# Patient Record
Sex: Female | Born: 1945 | Race: White | Hispanic: No | Marital: Married | Smoking: Former smoker
Health system: Southern US, Community
[De-identification: ages and names within clinical notes are randomized; demographics above are authoritative.]

## PROBLEM LIST (undated history)

## (undated) DIAGNOSIS — Z923 Personal history of irradiation: Secondary | ICD-10-CM

## (undated) DIAGNOSIS — M810 Age-related osteoporosis without current pathological fracture: Secondary | ICD-10-CM

## (undated) DIAGNOSIS — D219 Benign neoplasm of connective and other soft tissue, unspecified: Secondary | ICD-10-CM

## (undated) DIAGNOSIS — C50911 Malignant neoplasm of unspecified site of right female breast: Secondary | ICD-10-CM

## (undated) DIAGNOSIS — C4490 Unspecified malignant neoplasm of skin, unspecified: Secondary | ICD-10-CM

## (undated) HISTORY — PX: BREAST EXCISIONAL BIOPSY: SUR124

## (undated) HISTORY — PX: TONSILLECTOMY: SUR1361

## (undated) HISTORY — PX: COLONOSCOPY: SHX174

## (undated) HISTORY — PX: APPENDECTOMY: SHX54

## (undated) HISTORY — DX: Malignant neoplasm of unspecified site of right female breast: C50.911

## (undated) HISTORY — DX: Age-related osteoporosis without current pathological fracture: M81.0

## (undated) HISTORY — DX: Benign neoplasm of connective and other soft tissue, unspecified: D21.9

## (undated) HISTORY — DX: Unspecified malignant neoplasm of skin, unspecified: C44.90

---

## 1974-04-23 HISTORY — PX: TUBAL LIGATION: SHX77

## 1998-06-22 ENCOUNTER — Other Ambulatory Visit: Admission: RE | Admit: 1998-06-22 | Discharge: 1998-06-22 | Payer: Self-pay | Admitting: Gynecology

## 1999-08-29 ENCOUNTER — Other Ambulatory Visit: Admission: RE | Admit: 1999-08-29 | Discharge: 1999-08-29 | Payer: Self-pay | Admitting: Gynecology

## 2000-08-28 ENCOUNTER — Other Ambulatory Visit: Admission: RE | Admit: 2000-08-28 | Discharge: 2000-08-28 | Payer: Self-pay | Admitting: Obstetrics and Gynecology

## 2000-09-12 ENCOUNTER — Ambulatory Visit (HOSPITAL_COMMUNITY): Admission: RE | Admit: 2000-09-12 | Discharge: 2000-09-12 | Payer: Self-pay | Admitting: Gynecology

## 2000-09-12 ENCOUNTER — Encounter: Payer: Self-pay | Admitting: Obstetrics and Gynecology

## 2000-10-07 ENCOUNTER — Ambulatory Visit (HOSPITAL_COMMUNITY): Admission: RE | Admit: 2000-10-07 | Discharge: 2000-10-07 | Payer: Self-pay | Admitting: Internal Medicine

## 2001-08-13 ENCOUNTER — Ambulatory Visit (HOSPITAL_BASED_OUTPATIENT_CLINIC_OR_DEPARTMENT_OTHER): Admission: RE | Admit: 2001-08-13 | Discharge: 2001-08-13 | Payer: Self-pay | Admitting: Plastic Surgery

## 2001-08-13 ENCOUNTER — Encounter (INDEPENDENT_AMBULATORY_CARE_PROVIDER_SITE_OTHER): Payer: Self-pay | Admitting: Specialist

## 2001-08-29 ENCOUNTER — Other Ambulatory Visit: Admission: RE | Admit: 2001-08-29 | Discharge: 2001-08-29 | Payer: Self-pay | Admitting: Obstetrics and Gynecology

## 2001-09-02 ENCOUNTER — Encounter (INDEPENDENT_AMBULATORY_CARE_PROVIDER_SITE_OTHER): Payer: Self-pay | Admitting: Specialist

## 2001-09-02 ENCOUNTER — Ambulatory Visit (HOSPITAL_BASED_OUTPATIENT_CLINIC_OR_DEPARTMENT_OTHER): Admission: RE | Admit: 2001-09-02 | Discharge: 2001-09-02 | Payer: Self-pay | Admitting: Plastic Surgery

## 2001-09-16 ENCOUNTER — Encounter: Payer: Self-pay | Admitting: Obstetrics and Gynecology

## 2001-09-16 ENCOUNTER — Ambulatory Visit (HOSPITAL_COMMUNITY): Admission: RE | Admit: 2001-09-16 | Discharge: 2001-09-16 | Payer: Self-pay | Admitting: Obstetrics and Gynecology

## 2002-09-08 ENCOUNTER — Other Ambulatory Visit: Admission: RE | Admit: 2002-09-08 | Discharge: 2002-09-08 | Payer: Self-pay | Admitting: Obstetrics and Gynecology

## 2002-09-18 ENCOUNTER — Ambulatory Visit (HOSPITAL_COMMUNITY): Admission: RE | Admit: 2002-09-18 | Discharge: 2002-09-18 | Payer: Self-pay | Admitting: Obstetrics and Gynecology

## 2002-09-18 ENCOUNTER — Encounter: Payer: Self-pay | Admitting: Obstetrics and Gynecology

## 2003-04-24 HISTORY — PX: TOE ARTHROSCOPY: SHX5003

## 2003-09-10 ENCOUNTER — Other Ambulatory Visit: Admission: RE | Admit: 2003-09-10 | Discharge: 2003-09-10 | Payer: Self-pay | Admitting: Obstetrics and Gynecology

## 2003-09-22 ENCOUNTER — Ambulatory Visit (HOSPITAL_COMMUNITY): Admission: RE | Admit: 2003-09-22 | Discharge: 2003-09-22 | Payer: Self-pay | Admitting: Obstetrics and Gynecology

## 2004-09-12 ENCOUNTER — Other Ambulatory Visit: Admission: RE | Admit: 2004-09-12 | Discharge: 2004-09-12 | Payer: Self-pay | Admitting: Obstetrics and Gynecology

## 2004-09-25 ENCOUNTER — Ambulatory Visit (HOSPITAL_COMMUNITY): Admission: RE | Admit: 2004-09-25 | Discharge: 2004-09-25 | Payer: Self-pay | Admitting: Obstetrics and Gynecology

## 2005-09-18 ENCOUNTER — Other Ambulatory Visit: Admission: RE | Admit: 2005-09-18 | Discharge: 2005-09-18 | Payer: Self-pay | Admitting: Obstetrics and Gynecology

## 2005-10-10 ENCOUNTER — Ambulatory Visit (HOSPITAL_COMMUNITY): Admission: RE | Admit: 2005-10-10 | Discharge: 2005-10-10 | Payer: Self-pay | Admitting: Obstetrics and Gynecology

## 2006-09-20 ENCOUNTER — Other Ambulatory Visit: Admission: RE | Admit: 2006-09-20 | Discharge: 2006-09-20 | Payer: Self-pay | Admitting: Obstetrics and Gynecology

## 2006-10-09 ENCOUNTER — Ambulatory Visit (HOSPITAL_COMMUNITY): Admission: RE | Admit: 2006-10-09 | Discharge: 2006-10-09 | Payer: Self-pay | Admitting: Obstetrics and Gynecology

## 2006-10-14 ENCOUNTER — Ambulatory Visit (HOSPITAL_COMMUNITY): Admission: RE | Admit: 2006-10-14 | Discharge: 2006-10-14 | Payer: Self-pay | Admitting: Obstetrics and Gynecology

## 2006-10-17 ENCOUNTER — Encounter: Admission: RE | Admit: 2006-10-17 | Discharge: 2006-10-17 | Payer: Self-pay | Admitting: Obstetrics and Gynecology

## 2007-09-24 ENCOUNTER — Other Ambulatory Visit: Admission: RE | Admit: 2007-09-24 | Discharge: 2007-09-24 | Payer: Self-pay | Admitting: Gynecology

## 2007-10-15 ENCOUNTER — Encounter: Admission: RE | Admit: 2007-10-15 | Discharge: 2007-10-15 | Payer: Self-pay | Admitting: Obstetrics and Gynecology

## 2007-11-06 ENCOUNTER — Ambulatory Visit (HOSPITAL_COMMUNITY): Payer: Self-pay | Admitting: Internal Medicine

## 2007-11-06 ENCOUNTER — Encounter (HOSPITAL_COMMUNITY): Admission: RE | Admit: 2007-11-06 | Discharge: 2007-12-06 | Payer: Self-pay | Admitting: Internal Medicine

## 2008-10-15 ENCOUNTER — Encounter: Admission: RE | Admit: 2008-10-15 | Discharge: 2008-10-15 | Payer: Self-pay | Admitting: Obstetrics and Gynecology

## 2008-11-04 ENCOUNTER — Encounter (HOSPITAL_COMMUNITY): Admission: RE | Admit: 2008-11-04 | Discharge: 2008-12-04 | Payer: Self-pay | Admitting: Internal Medicine

## 2008-11-04 ENCOUNTER — Ambulatory Visit (HOSPITAL_COMMUNITY): Payer: Self-pay | Admitting: Internal Medicine

## 2008-12-22 DIAGNOSIS — D219 Benign neoplasm of connective and other soft tissue, unspecified: Secondary | ICD-10-CM

## 2008-12-22 HISTORY — DX: Benign neoplasm of connective and other soft tissue, unspecified: D21.9

## 2009-10-18 ENCOUNTER — Encounter: Admission: RE | Admit: 2009-10-18 | Discharge: 2009-10-18 | Payer: Self-pay | Admitting: Obstetrics and Gynecology

## 2010-05-14 ENCOUNTER — Encounter: Payer: Self-pay | Admitting: Obstetrics and Gynecology

## 2010-09-08 NOTE — Op Note (Signed)
[place]  Patient:    Kaitlyn Porter, Kaitlyn Porter Visit Number: [id number] MRN: [id number]          Service Type: DSU Location: [facility] Attending Physician:  Loura Halt Ii Dictated by:   Alfredia Ferguson, M.D. Proc. Date: 08/13/01 Admit Date:  08/13/2001   CC:         [place]. Yetta Barre, M.D.   Operative Report  PREOPERATIVE DIAGNOSIS: 1. 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 2. A 3 mm scaly lesion, left nasal ala.  POSTOPERATIVE DIAGNOSIS: 1. 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 2. A 3 mm scaly lesion, left nasal ala.  OPERATION PERFORMED: 1. Punch biopsy lesion, left nasal ala. 2. Elliptical excision of previously biopsied basal cell carcinoma of    upper lip.  SURGEON:  Alfredia Ferguson, M.D.  ANESTHESIA:  2% Xylocaine 1:100,000 epinephrine.  INDICATIONS FOR PROCEDURE:  The patient is a 65 year old woman with a lesion located in the philtrum of her right upper lip right at the vermilion border. The lesion was biopsied and returned basal cell carcinoma.  It was approximately 3 to 4 mm in diameter.  The plan is to excise this lesion in elliptical fashion.  The patient understands she will be trading what she has for a permanent and potentially unsightly scar.  The patient also points out an approximately 3 mm scaly lesion in the left nasal alar area.  She wishes to have it biopsied to rule out the possibility of malignancy.  DESCRIPTION OF PROCEDURE:  Skin markers were placed in elliptical fashion around the lesion on the upper mid lip.  Approximately 2 mm margins were used. Local anesthesia was infiltrated in that area and also in the area of the left nasal ala.  The lower face was prepped and draped in sterile fashion.  After waiting approximately 10 minutes, attention was directed first to the left nasal ala.  A 2 mm punch biopsy was  taken of the lesion in the left nasal ala. The lesion was passed off for pathology.  Attention was  now directed to the upper lip.  An elliptical excision of the lesion down to the level of the orbicularis muscle was carried out.  The specimen was passed off for pathology.  Hemostasis was accomplished using electrocautery.  The wound was undermined for several millimeters in all directions.  The dermis of the incision was closed using interrupted 5-0 Vicryl suture.  The vermilion line was closed using a single interrupted 6-0 nylon to ensure that it is well aligned.  The skin above the vermilion border was closed using interrupted 6-0 nylon suture.  Several sutures were placed in the dry vermilion using 6-0 nylon suture.  As the tip of the incision approached the wet vermilion, the suture used to close the wound was 5-0 chromic suture.  The wound approximated very nicely.  The patient tolerated the procedure well.  Light dressings were applied to both areas.  The patient was discharged home in satisfactory condition. Dictated by:   Alfredia Ferguson, M.D. Attending Physician:  Loura Halt Ii DD:  08/13/01 TD:  08/13/01 Job: [id number]

## 2010-09-08 NOTE — Op Note (Signed)
[place]  Patient:    Kaitlyn Porter, Kaitlyn Porter Visit Number: [id number] MRN: [id number]          Service Type: DSU Location: [facility] Attending Physician:  Loura Halt Ii Dictated by:   Alfredia Ferguson, M.D. Proc. Date: 09/02/01 Admit Date:  09/02/2001 Discharge Date: 09/02/2001   CC:         Reuel Boom B. Yetta Barre, M.D. [facility]   Operative Report  PREOPERATIVE DIAGNOSIS:  A 3 mm basal cell carcinoma, left nasal tip.  POSTOPERATIVE DIAGNOSIS:  A 3 mm basal cell carcinoma, left nasal tip.  OPERATION PERFORMED:  Excision, basal cell carcinoma, left nasal tip.  SURGEON:  Alfredia Ferguson, M.D.  ANESTHESIA:  Xylocaine 2%, 1:100,000 epinephrine.  INDICATIONS FOR SURGERY:  This is a 65 year old woman with biopsy-proven basal cell carcinoma located on the left nasal tip. She wishes to have it excised in order to clear the tumor. She understands the risks of positive margins and unsightly scarring. In spite of that, she wishes to proceed with the operation.  DESCRIPTION OF SURGERY:  Skin marks were placed in an elliptical fashion around the lesion and local anesthesia was infiltrated. The nasal area was prepped and draped in a sterile fashion. After waiting approximately seven minutes, an elliptical excision of the lesion down to the level of subcutaneous tissue was carried out. Hemostasis was accomplished using pressure. Specimen was passed off for pathology. Wound edges were undermined for distance of several millimeters. The wound was closed using interrupted 5-0 nylon suture. Light dressing was applied and the patient was discharged to home in satisfactory condition. Dictated by:   Alfredia Ferguson, M.D. Attending Physician:  Loura Halt Ii DD:  09/11/01 TD:  09/13/01 Job: [id number]

## 2010-09-13 ENCOUNTER — Other Ambulatory Visit: Payer: Self-pay | Admitting: Obstetrics and Gynecology

## 2010-09-13 DIAGNOSIS — Z1231 Encounter for screening mammogram for malignant neoplasm of breast: Secondary | ICD-10-CM

## 2010-10-20 ENCOUNTER — Ambulatory Visit
Admission: RE | Admit: 2010-10-20 | Discharge: 2010-10-20 | Disposition: A | Discharge status: Home / Self Care | Payer: BC Managed Care – PPO | Source: Ambulatory Visit | Attending: Obstetrics and Gynecology | Admitting: Obstetrics and Gynecology

## 2010-10-20 DIAGNOSIS — Z1231 Encounter for screening mammogram for malignant neoplasm of breast: Secondary | ICD-10-CM

## 2010-12-06 MED ORDER — SODIUM CHLORIDE 0.45 % IV SOLN
Freq: Once | INTRAVENOUS | Status: AC
  Administered 2010-12-07: 09:00:00 via INTRAVENOUS

## 2010-12-07 ENCOUNTER — Encounter (HOSPITAL_COMMUNITY): Payer: Self-pay

## 2010-12-07 ENCOUNTER — Ambulatory Visit (HOSPITAL_COMMUNITY)
Admission: RE | Admit: 2010-12-07 | Discharge: 2010-12-07 | Disposition: A | Discharge status: Home / Self Care | Payer: Medicare Other | Source: Ambulatory Visit | Attending: Internal Medicine | Admitting: Internal Medicine

## 2010-12-07 ENCOUNTER — Encounter (HOSPITAL_COMMUNITY)
Admission: RE | Disposition: A | Discharge status: Home / Self Care | Payer: Self-pay | Source: Ambulatory Visit | Attending: Internal Medicine

## 2010-12-07 ENCOUNTER — Encounter (INDEPENDENT_AMBULATORY_CARE_PROVIDER_SITE_OTHER): Payer: Medicare Other | Admitting: Internal Medicine

## 2010-12-07 DIAGNOSIS — Z1211 Encounter for screening for malignant neoplasm of colon: Secondary | ICD-10-CM | POA: Insufficient documentation

## 2010-12-07 HISTORY — PX: COLONOSCOPY: SHX5424

## 2010-12-07 SURGERY — COLONOSCOPY
Anesthesia: Moderate Sedation

## 2010-12-07 MED ORDER — MEPERIDINE HCL 50 MG/ML IJ SOLN
INTRAMUSCULAR | Status: AC
  Filled 2010-12-07: qty 1

## 2010-12-07 MED ORDER — MEPERIDINE HCL 50 MG/ML IJ SOLN
INTRAMUSCULAR | Status: DC | PRN
  Administered 2010-12-07 (×2): 25 mg via INTRAVENOUS

## 2010-12-07 MED ORDER — MIDAZOLAM HCL 5 MG/5ML IJ SOLN
INTRAMUSCULAR | Status: AC
  Filled 2010-12-07: qty 5

## 2010-12-07 MED ORDER — STERILE WATER FOR IRRIGATION IR SOLN
Status: DC | PRN
  Administered 2010-12-07: 10:00:00

## 2010-12-07 MED ORDER — MIDAZOLAM HCL 5 MG/5ML IJ SOLN
INTRAMUSCULAR | Status: DC | PRN
  Administered 2010-12-07 (×2): 2 mg via INTRAVENOUS

## 2010-12-07 NOTE — Op Note (Signed)
COLONOSCOPY PROCEDURE REPORT  PATIENT:  Kaitlyn Porter  MR#:  [id number] Birthdate:  01-22-1946, 65 y.o., female Endoscopist:  Dr. Malissa Hippo, MD Referred By:  Dr. [name] MD  Procedure Date: 12/07/2010  Procedure:   Colonoscopy  Indications:  Average risk screening colonoscopy.  Informed Consent: Procedure and risks were reviewed with the patient and informed consent was obtained.  Medications:  Demerol 50  mg IV Versed 4 mg IV  Description of procedure:  After a digital rectal exam was performed, that colonoscope was advanced from the anus through the rectum and colon to the area of the cecum, ileocecal valve and appendiceal orifice. The cecum was deeply intubated. These structures were well-seen and photographed for the record. From the level of the cecum and ileocecal valve, the scope was slowly and cautiously withdrawn. The mucosal surfaces were carefully surveyed utilizing scope tip to flexion to facilitate fold flattening as needed. The scope was pulled down into the rectum where a thorough exam including retroflexion was performed.  Findings:   Prep satisfactory. Normal mucosa throughout.. Normal mucosa at rectum and anorectal junction.  Therapeutic/Diagnostic Maneuvers Performed:  None  Complications:  None  Cecal Withdrawal Time:  8 minutes  Impression:  Normal colonoscopy.   Recommendations:  Resume usual diet and medications. Next screening exam in 10 years.  [name] U  12/07/2010 10:09 AM  CC: Dr. [name] MD.

## 2010-12-07 NOTE — H&P (Signed)
Kaitlyn Porter is an 65 y.o. female.   Chief Complaint: For colonoscopy. HPI: Patient is 65 year old Caucasian female who is in for average risk screening colonoscopy. Her last exam was 10 years ago. She denies abdominal pain change in her bowel habits rectal bleeding or melena. Family history is negative for colorectal carcinoma.  Past Medical History  Diagnosis Date  . No pertinent past medical history     Past Surgical History  Procedure Date  . Tonsillectomy   . Appendectomy   . Vaginal delivery     times 2  . Colonoscopy 10 years ago    dr Kaitlyn Porter    History reviewed. No pertinent family history. Social History:  reports that she has never smoked. She does not have any smokeless tobacco history on file. She reports that she does not drink alcohol or use illicit drugs.  Allergies: Not on File  Medications Prior to Admission  Medication Dose Route Frequency Provider Last Rate Last Dose  . 0.45 % sodium chloride infusion   Intravenous Once Malissa Hippo, MD 20 mL/hr at 12/07/10 0841    . meperidine (DEMEROL) 50 MG/ML injection           . midazolam (VERSED) 5 MG/5ML injection            Medications Prior to Admission  Medication Sig Dispense Refill  . calcium carbonate (OS-CAL) 600 MG TABS Take 600 mg by mouth 2 (two) times daily with a meal.        . fish oil-omega-3 fatty acids 1000 MG capsule Take 1,000 g by mouth daily.        . Multiple Vitamin (MULTIVITAMIN) capsule Take 1 capsule by mouth daily.          No results found for this or any previous visit (from the past 48 hour(s)). No results found.  Review of Systems  Constitutional: Negative for weight loss.  Gastrointestinal: Negative for abdominal pain, diarrhea, constipation, blood in stool and melena.    Blood pressure 136/81, pulse 74, temperature 97.7 F (36.5 C), temperature source Oral, resp. rate 19, height 5\' 3"  (1.6 m), weight 125 lb (56.7 kg), SpO2 100.00%. Physical Exam  Constitutional: She  appears well-developed and well-nourished.  HENT:  Mouth/Throat: Oropharynx is clear and moist.  Eyes: Conjunctivae are normal. No scleral icterus.  Neck: No thyromegaly present.  Cardiovascular: Normal rate, regular rhythm and normal heart sounds.   No murmur heard. Respiratory: Breath sounds normal.  GI: Soft. She exhibits no distension and no mass. There is no tenderness.  Musculoskeletal: She exhibits no edema.  Lymphadenopathy:    She has no cervical adenopathy.  Neurological: She is alert.  Skin: Skin is warm and dry.     Assessment/Plan For average risk screening colonoscopy.  Kaitlyn Porter U 12/07/2010, 9:39 AM

## 2010-12-15 ENCOUNTER — Encounter (HOSPITAL_COMMUNITY): Payer: Self-pay | Admitting: Internal Medicine

## 2011-08-29 ENCOUNTER — Encounter (INDEPENDENT_AMBULATORY_CARE_PROVIDER_SITE_OTHER): Payer: Self-pay

## 2011-09-12 ENCOUNTER — Other Ambulatory Visit: Payer: Self-pay | Admitting: Obstetrics and Gynecology

## 2011-10-22 ENCOUNTER — Other Ambulatory Visit: Payer: Self-pay | Admitting: Obstetrics and Gynecology

## 2011-10-22 ENCOUNTER — Ambulatory Visit
Admission: RE | Admit: 2011-10-22 | Discharge: 2011-10-22 | Disposition: A | Discharge status: Home / Self Care | Payer: Medicare Other | Source: Ambulatory Visit | Attending: Obstetrics and Gynecology | Admitting: Obstetrics and Gynecology

## 2011-10-22 DIAGNOSIS — Z1231 Encounter for screening mammogram for malignant neoplasm of breast: Secondary | ICD-10-CM

## 2012-06-18 ENCOUNTER — Encounter: Payer: Self-pay | Admitting: *Deleted

## 2012-09-18 ENCOUNTER — Other Ambulatory Visit: Payer: Self-pay

## 2012-09-18 DIAGNOSIS — Z1231 Encounter for screening mammogram for malignant neoplasm of breast: Secondary | ICD-10-CM

## 2012-10-03 ENCOUNTER — Other Ambulatory Visit: Payer: Self-pay

## 2012-10-03 MED ORDER — CONJ ESTROG-MEDROXYPROGEST ACE 0.625-2.5 MG PO TABS: 1.0000 | ORAL_TABLET | Freq: Every day | ORAL | Status: DC

## 2012-10-03 NOTE — Telephone Encounter (Signed)
[facility] 90 day supply for prempro 0.625/2.5. Last aex was with dr Tresa Res on 11-19-11. next aex is 11-21-12. rx sent for 90 day supply with 0 refills

## 2012-10-22 ENCOUNTER — Ambulatory Visit
Admission: RE | Admit: 2012-10-22 | Discharge: 2012-10-22 | Disposition: A | Discharge status: Home / Self Care | Payer: Medicare PPO | Source: Ambulatory Visit

## 2012-10-22 DIAGNOSIS — Z1231 Encounter for screening mammogram for malignant neoplasm of breast: Secondary | ICD-10-CM

## 2012-10-29 ENCOUNTER — Other Ambulatory Visit: Payer: Self-pay | Admitting: Obstetrics and Gynecology

## 2012-10-29 DIAGNOSIS — R928 Other abnormal and inconclusive findings on diagnostic imaging of breast: Secondary | ICD-10-CM

## 2012-11-10 ENCOUNTER — Other Ambulatory Visit: Payer: Self-pay | Admitting: Obstetrics and Gynecology

## 2012-11-10 ENCOUNTER — Ambulatory Visit
Admission: RE | Admit: 2012-11-10 | Discharge: 2012-11-10 | Disposition: A | Discharge status: Home / Self Care | Payer: Medicare PPO | Source: Ambulatory Visit | Attending: Obstetrics and Gynecology | Admitting: Obstetrics and Gynecology

## 2012-11-10 DIAGNOSIS — R928 Other abnormal and inconclusive findings on diagnostic imaging of breast: Secondary | ICD-10-CM

## 2012-11-10 DIAGNOSIS — R921 Mammographic calcification found on diagnostic imaging of breast: Secondary | ICD-10-CM

## 2012-11-21 ENCOUNTER — Ambulatory Visit (INDEPENDENT_AMBULATORY_CARE_PROVIDER_SITE_OTHER): Payer: Medicare PPO | Admitting: Obstetrics and Gynecology

## 2012-11-21 ENCOUNTER — Encounter: Payer: Self-pay | Admitting: Obstetrics and Gynecology

## 2012-11-21 VITALS — BP 122/76 | HR 82 | Resp 18 | Ht 63.5 in | Wt 124.0 lb

## 2012-11-21 DIAGNOSIS — Z01419 Encounter for gynecological examination (general) (routine) without abnormal findings: Secondary | ICD-10-CM

## 2012-11-21 MED ORDER — CONJ ESTROGENS-BAZEDOXIFENE 0.45-20 MG PO TABS: 45.0000 mg | ORAL_TABLET | Freq: Once | ORAL | Status: DC

## 2012-11-21 MED ORDER — CONJ ESTROGENS-BAZEDOXIFENE 0.45-20 MG PO TABS: 1.0000 | ORAL_TABLET | Freq: Every day | ORAL | Status: DC

## 2012-11-21 NOTE — Progress Notes (Signed)
67 y.o.   Married    Caucasian   female   G2P2   here for annual exam.  Ran out of prempro and felt very anxious without it; pt thinks it helps her demeanor "it's my nerve pill"  Risks and benefits discussed and pt wants to continue.    No LMP recorded. Patient is postmenopausal.          Sexually active: yes  The current method of family planning is tubal ligation and post menopausal status.    Exercising: Line Dancing 2 days a week Last mammogram:  10/2012 need additional testing, needle Bx right breast 11/27/12 Last pap smear:10/19/09 neg History of abnormal pap: no Smoking: quit in 1976 Alcohol: no Last colonoscopy: 2013 normal, may not have to have another one per (Dr. Renae Fickle) Last Bone Density:  10/22/2011 osteoporosis Last tetanus shot: less than 10 years Last cholesterol check: 08/22/2012 normal   Total 193   LDL 111   HDL 71  Hgb:    pcp            Urine: pcp   Family History  Problem Relation Age of Onset  . Diabetes Brother   . Cancer Maternal Grandfather   . Heart failure Mother   . Aneurysm Mother     abdominal    There are no active problems to display for this patient.   Past Medical History  Diagnosis Date  . Osteoporosis   . Fibroids 12/2008    history of    Past Surgical History  Procedure Laterality Date  . Tonsillectomy    . Appendectomy    . Vaginal delivery      times 2  . Colonoscopy  10 years ago    dr Karilyn Cota  . Colonoscopy  12/07/2010    Procedure: COLONOSCOPY;  Surgeon: Malissa Hippo, MD;  [facility];  Service: Endoscopy;  Laterality: N/A;  . Tubal ligation  1976    Allergies: Latex  Current Outpatient Prescriptions  Medication Sig Dispense Refill  . estrogen, conjugated,-medroxyprogesterone (PREMPRO) 0.625-2.5 MG per tablet Take 1 tablet by mouth daily.  90 tablet  0  . fish oil-omega-3 fatty acids 1000 MG capsule Take 1,000 g by mouth daily.        . Multiple Vitamin (MULTIVITAMIN) capsule Take 1 capsule by mouth daily.         . Vitamin D, Ergocalciferol, (DRISDOL) 50000 UNITS CAPS Take 50,000 Units by mouth every 30 (thirty) days.       No current facility-administered medications for this visit.    ROS: Pertinent items are noted in HPI.  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, retired, enjoys Materials engineer  Exam:    BP 122/76  Pulse 82  Resp 18  Ht 5' 3.5" (1.613 m)  Wt 124 lb (56.246 kg)  BMI 21.62 kg/m2  Ht stable and wt up three pounds from last year Wt Readings from Last 3 Encounters:  11/21/12 124 lb (56.246 kg)  06/18/12 121 lb (54.885 kg)  12/07/10 125 lb (56.7 kg)     Ht Readings from Last 3 Encounters:  11/21/12 5' 3.5" (1.613 m)  06/18/12 5' 3.5" (1.613 m)  12/07/10 5\' 3"  (1.6 m)    General appearance: alert, cooperative and appears stated age Head: Normocephalic, without obvious abnormality, atraumatic Neck: no adenopathy, supple, symmetrical, trachea midline and thyroid not enlarged, symmetric, no tenderness/mass/nodules Lungs: clear to auscultation bilaterally Breasts: Inspection negative, No nipple retraction or dimpling, No nipple discharge or  bleeding, No axillary or supraclavicular adenopathy, Normal to palpation without dominant masses Heart: regular rate and rhythm Abdomen: soft, non-tender; bowel sounds normal; no masses,  no organomegaly Extremities: extremities normal, atraumatic, no cyanosis or edema Skin: Skin color, texture, turgor normal. No rashes or lesions Lymph nodes: Cervical, supraclavicular, and axillary nodes normal. No abnormal inguinal nodes palpated Neurologic: Grossly normal   Pelvic: External genitalia:  no lesions              Urethra:  normal appearing urethra with no masses, tenderness or lesions              Bartholins and Skenes: normal                 Vagina: normal appearing vagina with normal color and discharge, no lesions              Cervix: normal appearance              Pap taken: no        Bimanual Exam:  Uterus:  uterus is normal size,  shape, consistency and nontender                                      Adnexa: normal adnexa in size, nontender and no masses                                      Rectovaginal: Confirms                                      Anus:  normal sphincter tone, no lesions  A: normal menopausal exam, on HRT     Osteoporosis - had reclast 2008, 09, and 2010 and fosomax June 2005 - June 2008.  Stopped reclast for a drug holiday     Known fibroids - uterus 9 x 5 x 7 cm in 2010     Breast bx d/t calcifications on screening MM being done next week     P:     mammogram counseled on breast self exam, mammography screening, osteoporosis, adequate intake of calcium and vitamin D, diet and exercise return annually or prn   Will change to [facility] this year to try to avoid the exposure of the breast to the MPA in prempro.   An After Visit Summary was printed and given to the patient.

## 2012-11-21 NOTE — Patient Instructions (Signed)

## 2012-11-25 ENCOUNTER — Telehealth: Payer: Self-pay | Admitting: *Deleted

## 2012-11-25 NOTE — Telephone Encounter (Signed)
Prior authorization form for [employer] , [facility], sent to [email or web address]. Medication : Duavee Rx 11/21/2012 per Dr. Tresa Res

## 2012-11-26 ENCOUNTER — Other Ambulatory Visit: Payer: Self-pay

## 2012-11-27 ENCOUNTER — Ambulatory Visit
Admission: RE | Admit: 2012-11-27 | Discharge: 2012-11-27 | Disposition: A | Discharge status: Home / Self Care | Payer: Medicare PPO | Source: Ambulatory Visit | Attending: Obstetrics and Gynecology | Admitting: Obstetrics and Gynecology

## 2012-11-27 ENCOUNTER — Other Ambulatory Visit (HOSPITAL_COMMUNITY): Payer: Self-pay | Admitting: Diagnostic Radiology

## 2012-11-27 DIAGNOSIS — R921 Mammographic calcification found on diagnostic imaging of breast: Secondary | ICD-10-CM

## 2012-12-01 ENCOUNTER — Telehealth: Payer: Self-pay | Admitting: Obstetrics and Gynecology

## 2012-12-01 NOTE — Telephone Encounter (Signed)
Patient called to let us know that her insurance denied her prescription Duavee . So she is going back to the Prempro. [facility] will be faxing a Prior Auth.

## 2012-12-03 ENCOUNTER — Other Ambulatory Visit: Payer: Self-pay | Admitting: Obstetrics and Gynecology

## 2012-12-03 NOTE — Telephone Encounter (Signed)
eScribe request for refill on PREMPRO Last filled - 11/19/11 X 1 YEAR Last AEX - 11/21/12 Next AEX - 11/25/13 Pt was changed to [facility] on 11/21/12.  RX denied.

## 2012-12-04 NOTE — Telephone Encounter (Signed)
Patient would like to change medication back to Prempro due to cost. Patient will call you later she is unable to be reached by phone.

## 2012-12-04 NOTE — Telephone Encounter (Signed)
Patient called to ask to change medication back to Prempro due to cost and insurance not cover [facility].  Last AEX 11/21/2012 with Dr. Tresa Res. Chart in your office.

## 2012-12-04 NOTE — Telephone Encounter (Signed)
Fine to rx prempro 0.625/2.5 mg    Three months supply, 3 refills.   Thanks.

## 2012-12-05 MED ORDER — CONJ ESTROG-MEDROXYPROGEST ACE 0.625-2.5 MG PO TABS: 1.0000 | ORAL_TABLET | Freq: Every day | ORAL | Status: DC

## 2012-12-05 NOTE — Telephone Encounter (Signed)
Rx sent to [facility] for Prempro as ordered per Dr. Tresa Res. Patient aware of this.

## 2013-02-03 ENCOUNTER — Other Ambulatory Visit: Payer: Self-pay | Admitting: Dermatology

## 2013-02-26 ENCOUNTER — Other Ambulatory Visit: Payer: Self-pay

## 2013-04-06 ENCOUNTER — Telehealth: Payer: Self-pay | Admitting: Obstetrics and Gynecology

## 2013-04-06 NOTE — Telephone Encounter (Signed)
Called [employer]: She will need Prior Authorization after 04/23/2013.

## 2013-04-06 NOTE — Telephone Encounter (Signed)
estrogen, conjugated,-medroxyprogesterone (PREMPRO) 0.625-2.5 MG per tablet  Take 1 tablet by mouth daily., Starting 12/05/2012, Until Discontinued, Normal, Last Dose: Not Recorded  Refills: 3 ordered Pharmacy: [facility] - [place], [place] - [address]  Patient received a letter in the mail from insurance saying she needed prior authorization for medication. [phone]

## 2013-04-06 NOTE — Telephone Encounter (Signed)
Message left to return call to Kaitlyn Porter at [phone].    

## 2013-04-07 NOTE — Telephone Encounter (Signed)
Spoke with patient. Advised has coverage until 2015. Advised we can do prior auth when needs refill. Patient just ordered 90 day refill. She will call back.

## 2013-04-07 NOTE — Telephone Encounter (Signed)
Pt is returning Tracy's call.

## 2013-04-07 NOTE — Telephone Encounter (Signed)
Pt is returning a call to Tracy °

## 2013-04-23 HISTORY — PX: BREAST SURGERY: SHX581

## 2013-04-23 HISTORY — PX: BREAST LUMPECTOMY: SHX2

## 2013-04-29 ENCOUNTER — Other Ambulatory Visit: Payer: Self-pay | Admitting: Obstetrics and Gynecology

## 2013-04-29 DIAGNOSIS — R921 Mammographic calcification found on diagnostic imaging of breast: Secondary | ICD-10-CM

## 2013-06-01 ENCOUNTER — Ambulatory Visit
Admission: RE | Admit: 2013-06-01 | Discharge: 2013-06-01 | Disposition: A | Discharge status: Home / Self Care | Payer: Medicare PPO | Source: Ambulatory Visit | Attending: Obstetrics and Gynecology | Admitting: Obstetrics and Gynecology

## 2013-06-01 DIAGNOSIS — R921 Mammographic calcification found on diagnostic imaging of breast: Secondary | ICD-10-CM

## 2013-07-01 ENCOUNTER — Telehealth: Payer: Self-pay | Admitting: Obstetrics and Gynecology

## 2013-07-01 NOTE — Telephone Encounter (Signed)
Prior auth requested from [facility]. States they will fax form in 3-5 minutes.

## 2013-07-01 NOTE — Telephone Encounter (Signed)
Faxed completed prior auth to [facility] with fax confirmation received.

## 2013-07-01 NOTE — Telephone Encounter (Signed)
Patient asking to speak to tracy regarding a reauthorization form her insurance faxed over.

## 2013-08-05 ENCOUNTER — Other Ambulatory Visit: Payer: Self-pay | Admitting: Dermatology

## 2013-11-02 ENCOUNTER — Telehealth: Payer: Self-pay | Admitting: Obstetrics and Gynecology

## 2013-11-02 ENCOUNTER — Other Ambulatory Visit: Payer: Self-pay

## 2013-11-02 DIAGNOSIS — Z1231 Encounter for screening mammogram for malignant neoplasm of breast: Secondary | ICD-10-CM

## 2013-11-02 DIAGNOSIS — M810 Age-related osteoporosis without current pathological fracture: Secondary | ICD-10-CM

## 2013-11-02 NOTE — Telephone Encounter (Signed)
Spoke with patient. She will have her Dexa scan done at [facility].Advised that order is in place and signed off by provider so she is ready to schedule at her [facility].  She is agreeable and will call to scheduled.  Routing to provider for final review. Patient agreeable to disposition. Will close encounter

## 2013-11-02 NOTE — Telephone Encounter (Signed)
Message left to return call to [place] at [phone].   Need to confirm, [facility]?  Has prior appointments at [facility].  [facility].  Will need paper order if wants [facility].

## 2013-11-02 NOTE — Telephone Encounter (Signed)
Patient request an order for BMD to be sent to [facility]. Patient will call [facility] later this afternoon to schedule this appointment.

## 2013-11-13 ENCOUNTER — Ambulatory Visit
Admission: RE | Admit: 2013-11-13 | Discharge: 2013-11-13 | Disposition: A | Discharge status: Home / Self Care | Payer: Medicare PPO | Source: Ambulatory Visit | Attending: Obstetrics and Gynecology | Admitting: Obstetrics and Gynecology

## 2013-11-13 ENCOUNTER — Ambulatory Visit
Admission: RE | Admit: 2013-11-13 | Discharge: 2013-11-13 | Disposition: A | Discharge status: Home / Self Care | Payer: Medicare PPO | Source: Ambulatory Visit

## 2013-11-13 DIAGNOSIS — M810 Age-related osteoporosis without current pathological fracture: Secondary | ICD-10-CM

## 2013-11-13 DIAGNOSIS — Z1231 Encounter for screening mammogram for malignant neoplasm of breast: Secondary | ICD-10-CM

## 2013-11-16 ENCOUNTER — Other Ambulatory Visit: Payer: Self-pay | Admitting: Obstetrics and Gynecology

## 2013-11-16 DIAGNOSIS — R928 Other abnormal and inconclusive findings on diagnostic imaging of breast: Secondary | ICD-10-CM

## 2013-11-16 NOTE — Progress Notes (Signed)
Mammogram hold entered.

## 2013-11-18 ENCOUNTER — Telehealth: Payer: Self-pay | Admitting: Obstetrics and Gynecology

## 2013-11-18 NOTE — Telephone Encounter (Signed)
Confirming pts appt °

## 2013-11-19 NOTE — Telephone Encounter (Signed)
Confirmed.

## 2013-11-21 DIAGNOSIS — C50911 Malignant neoplasm of unspecified site of right female breast: Secondary | ICD-10-CM

## 2013-11-21 HISTORY — DX: Malignant neoplasm of unspecified site of right female breast: C50.911

## 2013-11-25 ENCOUNTER — Encounter: Payer: Self-pay | Admitting: Obstetrics and Gynecology

## 2013-11-25 ENCOUNTER — Ambulatory Visit (INDEPENDENT_AMBULATORY_CARE_PROVIDER_SITE_OTHER): Payer: Medicare PPO | Admitting: Obstetrics and Gynecology

## 2013-11-25 VITALS — BP 126/60 | HR 76 | Resp 14 | Ht 63.5 in | Wt 125.6 lb

## 2013-11-25 DIAGNOSIS — N841 Polyp of cervix uteri: Secondary | ICD-10-CM

## 2013-11-25 DIAGNOSIS — Z01419 Encounter for gynecological examination (general) (routine) without abnormal findings: Secondary | ICD-10-CM

## 2013-11-25 MED ORDER — CONJ ESTROG-MEDROXYPROGEST ACE 0.625-2.5 MG PO TABS: 1.0000 | ORAL_TABLET | Freq: Every day | ORAL | Status: DC

## 2013-11-25 NOTE — Progress Notes (Signed)
Patient ID: Kaitlyn Porter, female   DOB: 1946-02-04, 68 y.o.   MRN: [id number] GYNECOLOGY VISIT  PCP:   Asencion Noble, MD  Referring provider:   HPI: 68 y.o.   Married  Caucasian  female   G2P2 with No LMP recorded. Patient is postmenopausal.   here for   AEX.   Taking PremPro HRT.  Takes for menopause symptoms.  Gives good quality of life.  Sleeps well. Taking for about 20 years.   Hgb:  13.9 on 08-31-13(Dr. Willey Blade) Urine:  Neg with PCP  GYNECOLOGIC HISTORY: No LMP recorded. Patient is postmenopausal. Sexually active:  yes Partner preference: female Contraception:  [place];  Menopausal hormone therapy: Prempro 0.625/2.5mg  DES exposure:   no Blood transfusions: no   Sexually transmitted diseases:   no GYN procedures and prior surgeries: Tubal ligation  Last mammogram:   11-13-13 revealed distortion of right breast.  Patient to return on 12-01-13 for diagnostic mammogram and possible ultrasound:[facility]              Last pap and high risk HPV testing:   10-19-09 wnl History of abnormal pap smear:  no   OB History   Grav Para Term Preterm Abortions TAB SAB Ect Mult Living   2 2        1        LIFESTYLE: Exercise:    Line Dancing           Tobacco:    no Alcohol:     no Drug use:  no  OTHER HEALTH MAINTENANCE: Tetanus/TDap:   Up to date with PCP Gardisil:              n/a Influenza:             01/2013 Zostavax:            Completed with PCP  Bone density:       10-22-11 revealed osteoporosis:[facility].  T score of hip -2.6, T score of spine -2.5.  Overall improved. Status post Zometa 3 - 4 year ago. Took it for three years. Colonoscopy:      2013 normal with Dr. Laural Golden in [place].  Next colonoscopy due 2023.  Cholesterol check: borderline:with PCP  Family History  Problem Relation Age of Onset  . Diabetes Brother   . Cancer Maternal Grandfather   . Heart failure Mother   . Aneurysm Mother     abdominal    There are no active problems to display for  this patient.  Past Medical History  Diagnosis Date  . Osteoporosis   . Fibroids 12/2008    history of    Past Surgical History  Procedure Laterality Date  . Tonsillectomy    . Appendectomy    . Vaginal delivery      times 2  . Colonoscopy  10 years ago    dr Laural Golden  . Colonoscopy  12/07/2010    Procedure: COLONOSCOPY;  Surgeon: Rogene Houston, MD;  [facility];  Service: Endoscopy;  Laterality: N/A;  . Tubal ligation  1976    ALLERGIES: Latex  Current Outpatient Prescriptions  Medication Sig Dispense Refill  . estrogen, conjugated,-medroxyprogesterone (PREMPRO) 0.625-2.5 MG per tablet Take 1 tablet by mouth daily.  90 tablet  3  . fish oil-omega-3 fatty acids 1000 MG capsule Take 1,000 g by mouth daily.        . Multiple Vitamin (MULTIVITAMIN) capsule Take 1 capsule by mouth daily.        Marland Kitchen  Vitamin D, Ergocalciferol, (DRISDOL) 50000 UNITS CAPS Take 50,000 Units by mouth every 30 (thirty) days.       No current facility-administered medications for this visit.     ROS:  Pertinent items are noted in HPI.  SOCIAL HISTORY:  Married.   PHYSICAL EXAMINATION:    BP 126/60  Pulse 76  Resp 14  Ht 5' 3.5" (1.613 m)  Wt 125 lb 9.6 oz (56.972 kg)  BMI 21.90 kg/m2   Wt Readings from Last 3 Encounters:  11/25/13 125 lb 9.6 oz (56.972 kg)  11/21/12 124 lb (56.246 kg)  06/18/12 121 lb (54.885 kg)     Ht Readings from Last 3 Encounters:  11/25/13 5' 3.5" (1.613 m)  11/21/12 5' 3.5" (1.613 m)  06/18/12 5' 3.5" (1.613 m)    General appearance: alert, cooperative and appears stated age Head: Normocephalic, without obvious abnormality, atraumatic Neck: no adenopathy, supple, symmetrical, trachea midline and thyroid not enlarged, symmetric, no tenderness/mass/nodules Lungs: clear to auscultation bilaterally Breasts: Inspection negative, No nipple retraction or dimpling, No nipple discharge or bleeding, No axillary or supraclavicular adenopathy, Normal to palpation  without dominant masses Heart: regular rate and rhythm Abdomen: soft, non-tender; no masses,  no organomegaly Extremities: extremities normal, atraumatic, no cyanosis or edema Skin: Skin color, texture, turgor normal. No rashes or lesions Lymph nodes: Cervical, supraclavicular, and axillary nodes normal. No abnormal inguinal nodes palpated Neurologic: Grossly normal  Pelvic: External genitalia:  no lesions              Urethra:  normal appearing urethra with no masses, tenderness or lesions              Bartholins and Skenes: normal                 Vagina: normal appearing vagina with normal color and discharge, no lesions              Cervix: normal appearance              Pap and high risk HPV testing done: No.          Bimanual Exam:  Uterus:  uterus is normal size, shape, consistency and nontender                                      Adnexa: normal adnexa in size, nontender and no masses                                      Rectovaginal: Confirms                                      Anus:  normal sphincter tone, no lesions  Cervical polyp removal Consent for procedure.  Ring forceps used to remove polyp, sent to pathology.  Minimal EBL.  No complications.  ASSESSMENT  Normal gynecologic exam. HRT patient.  Osteoporosis.  Status post Reclast.  Cervical polyp removal. Incomplete right mammogram.   PLAN  Mammogram recommended yearly.  Pap smear and high risk HPV testing not indicated.  Counseled on self breast exam, Calcium and vitamin D intake, exercise. Next bone density in 2 years.  Discussed risks and benefits of HRT and continuing on PremPro.  Discussed risks of  DVT, PE, MI, and stroke.  Patient wishes to continue. See Epic orders. Cervical polyp to pathology.  Return annually or prn   An After Visit Summary was printed and given to the patient.

## 2013-11-25 NOTE — Patient Instructions (Signed)

## 2013-11-27 LAB — IPS OTHER TISSUE BIOPSY

## 2013-12-01 ENCOUNTER — Ambulatory Visit
Admission: RE | Admit: 2013-12-01 | Discharge: 2013-12-01 | Disposition: A | Discharge status: Home / Self Care | Payer: Medicare PPO | Source: Ambulatory Visit | Attending: Obstetrics and Gynecology | Admitting: Obstetrics and Gynecology

## 2013-12-01 ENCOUNTER — Ambulatory Visit: Payer: Medicare PPO

## 2013-12-01 ENCOUNTER — Other Ambulatory Visit: Payer: Self-pay | Admitting: Obstetrics and Gynecology

## 2013-12-01 ENCOUNTER — Other Ambulatory Visit: Payer: Self-pay

## 2013-12-01 DIAGNOSIS — R928 Other abnormal and inconclusive findings on diagnostic imaging of breast: Secondary | ICD-10-CM

## 2013-12-01 DIAGNOSIS — N63 Unspecified lump in unspecified breast: Secondary | ICD-10-CM

## 2013-12-03 ENCOUNTER — Other Ambulatory Visit: Payer: Self-pay | Admitting: Obstetrics and Gynecology

## 2013-12-03 DIAGNOSIS — R928 Other abnormal and inconclusive findings on diagnostic imaging of breast: Secondary | ICD-10-CM

## 2013-12-03 DIAGNOSIS — N63 Unspecified lump in unspecified breast: Secondary | ICD-10-CM

## 2013-12-04 ENCOUNTER — Ambulatory Visit
Admission: RE | Admit: 2013-12-04 | Discharge: 2013-12-04 | Disposition: A | Discharge status: Home / Self Care | Payer: Medicare PPO | Source: Ambulatory Visit | Attending: Obstetrics and Gynecology | Admitting: Obstetrics and Gynecology

## 2013-12-04 DIAGNOSIS — N63 Unspecified lump in unspecified breast: Secondary | ICD-10-CM

## 2013-12-04 DIAGNOSIS — R928 Other abnormal and inconclusive findings on diagnostic imaging of breast: Secondary | ICD-10-CM

## 2013-12-07 ENCOUNTER — Telehealth: Payer: Self-pay | Admitting: Obstetrics and Gynecology

## 2013-12-07 ENCOUNTER — Other Ambulatory Visit: Payer: Self-pay | Admitting: Obstetrics and Gynecology

## 2013-12-07 DIAGNOSIS — C50911 Malignant neoplasm of unspecified site of right female breast: Secondary | ICD-10-CM

## 2013-12-07 NOTE — Telephone Encounter (Signed)
Spoke with Amy nurse reviewer at [facility], received authorization for 30 days from 12/11/13 Auth number [id number].  Called Anderson Malta at [facility] and was given authorization number.   Routing to provider for final review. Patient agreeable to disposition. Will close encounter

## 2013-12-07 NOTE — Telephone Encounter (Signed)
Advised by Anderson Malta at [facility] that MRI bilateral breast has been ordered by [place].  Needs precert.  Patient has appointment this Friday for MRI. Patient not aware of MRI or results.

## 2013-12-07 NOTE — Telephone Encounter (Signed)
Calling wanting to talk to someone about pt being diagnosed with breast cancer said that pt is not aware yet.

## 2013-12-08 ENCOUNTER — Telehealth: Payer: Self-pay | Admitting: Obstetrics and Gynecology

## 2013-12-08 NOTE — Telephone Encounter (Signed)
Spoke with patient. Advised spoke with Regina Eck CNM and she states that patient should stop taking prempro all together at this time. Patient may have increased hot flashes which should subside soon after. Patient would like to know if there is anything else she can take. Advised patient that we would not want her on anything containing estrogen. Advised patient that there may be OTC options not containing estrogen that patient could try but that I would like to check with provider first to make sure and that I would call patient back with further recommendations.  Routing to [place] as covering Cc: Dr.Silva

## 2013-12-08 NOTE — Telephone Encounter (Signed)
Patient wants to speak with nurse about how to "come off of Prempro." She says she has breast cancer and will soon be starting treatment.

## 2013-12-09 NOTE — Telephone Encounter (Signed)
She should not take anything that is a supplement for "hot flashes" as they usually contain plant estrogens.  As she is going through her treatment, usually the oncologists make recommendations about hot flashes as they know the specific medications she will be on for treatment.  Commonly used are low dosed SSRIs like zoloft and effexor.  We can start one of these now if she would like.

## 2013-12-09 NOTE — Telephone Encounter (Signed)
Phone call to patient regarding her new diagnosis of breast cancer.  Stopped her PremPro already before receiving her diagnosis of breast cancer. No real hot flashes.  Patient will have an MRI on August 21 and then see the surgeon Dr. Barry Dienes on August 24th.  Patient has received an Rx for Valium from her PCP to use for the MRI.  I advised patient to call the radiology center to ask if it is OK to take Valium prior to the MRI.  (I told her I do not know if they require signing a consent form.)  I discussed options for care for hot flashes - Brisdelle and Effexor. I told patient not to start doing herbal options or mega vitamin treatments.   I explained that she would also have an appointment eventually with the [facility] team.   She thanked me for the call.

## 2013-12-11 ENCOUNTER — Ambulatory Visit
Admission: RE | Admit: 2013-12-11 | Discharge: 2013-12-11 | Disposition: A | Discharge status: Home / Self Care | Payer: Medicare PPO | Source: Ambulatory Visit | Attending: Obstetrics and Gynecology | Admitting: Obstetrics and Gynecology

## 2013-12-11 DIAGNOSIS — C50911 Malignant neoplasm of unspecified site of right female breast: Secondary | ICD-10-CM

## 2013-12-11 MED ORDER — GADOBENATE DIMEGLUMINE 529 MG/ML IV SOLN
11.0000 mL | Freq: Once | INTRAVENOUS | Status: AC | PRN
  Administered 2013-12-11: 11 mL via INTRAVENOUS

## 2013-12-14 ENCOUNTER — Encounter (INDEPENDENT_AMBULATORY_CARE_PROVIDER_SITE_OTHER): Payer: Self-pay | Admitting: General Surgery

## 2013-12-14 ENCOUNTER — Ambulatory Visit (INDEPENDENT_AMBULATORY_CARE_PROVIDER_SITE_OTHER): Payer: Medicare PPO | Admitting: General Surgery

## 2013-12-14 VITALS — BP 140/88 | HR 71 | Temp 97.7°F | Resp 16 | Ht 63.0 in | Wt 124.6 lb

## 2013-12-14 DIAGNOSIS — C50911 Malignant neoplasm of unspecified site of right female breast: Secondary | ICD-10-CM

## 2013-12-14 DIAGNOSIS — C50919 Malignant neoplasm of unspecified site of unspecified female breast: Secondary | ICD-10-CM

## 2013-12-14 DIAGNOSIS — C50211 Malignant neoplasm of upper-inner quadrant of right female breast: Secondary | ICD-10-CM | POA: Insufficient documentation

## 2013-12-14 DIAGNOSIS — C50219 Malignant neoplasm of upper-inner quadrant of unspecified female breast: Secondary | ICD-10-CM

## 2013-12-14 DIAGNOSIS — R932 Abnormal findings on diagnostic imaging of liver and biliary tract: Secondary | ICD-10-CM

## 2013-12-14 NOTE — Patient Instructions (Signed)
IF YOU ARE TAKING ASPIRIN, COUMADIN/WARFARIN, PLAVIX, OR OTHER BLOOD THINNER, PLEASE LET us KNOW IMMEDIATELY.  WE WILL NEED TO DISCUSS WITH THE PRESCRIBING PROVIDER IF THESE ARE SAFE TO STOP. IF THESE ARE NOT STOPPED AT THE APPROPRIATE TIME, THIS WILL RESULT IN A DELAY FOR YOUR SURGERY.  DO NOT TAKE THESE MEDICATIONS OR IBUPROFEN/NAPROXEN WITHIN A WEEK BEFORE SURGERY.   The main risks of surgery are bleeding, infection, damage to other structures, and seroma (accumulation of fluid) under the incision site(s).    These complications may lead to additional procedures such as drainage of seroma/infection.  Depending on final pathology findings, you may need other surgeries to obtain negative margins or to take more lymph nodes.   Most women do accumulate fluid in the breast cavity where the specimen was removed. We do not always have to drain this fluid.  If your breast is very tense, painful, or red, then we may need to numb the skin and use a needle to aspirate the fluid.  We do provide patients with a Breast Binder.  The purpose of this is to avoid the use of tape on the sensitive tissue of the breast and to provide some compression to minimize the risk of seroma.  If the binder is uncomfortable, you may find that a tank top with a built-in shelf bra or a loose sports bra works better for you.  I recommend wearing this around the clock for the first 1-2 weeks except in the shower.    You may remove your dressings and may shower 48 hours after surgery.    Many patients have some constipation in the week after surgery due to the narcotics and anesthesia.  You may need over the counter stool softeners or laxatives if you experience difficulty having bowel movements.    If the following occur, call our office at [phone]: If you have a fever over 101 or pain that is severe despite narcotics. If you have redness or drainage at the wound. If you develop persistent nausea or vomiting.  I will  follow you back up in 1-4 weeks.    Please submit any paperwork about time off work/insurance forms to the front desk.

## 2013-12-14 NOTE — Progress Notes (Signed)
Chief complaint:  Right breast cancer  Referring MD: Dr. Miquel Dunn  HISTORY: Pt is a 68 yo F who presents with screening detected distortion in the right breast.  She has no family history of breast cancer.  She has been on HRT for many years.  This area was seen to be 1.4x1.1.0.9 cm at the 1 o'clock position on ultrasound.  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  MRI demonstrated a size of 2.1 x 1.6 x 1.6 cm.    Past Medical History  Diagnosis Date  . Osteoporosis   . Fibroids 12/2008    history of    Past Surgical History  Procedure Laterality Date  . Tonsillectomy    . Appendectomy    . Vaginal delivery      times 2  . Colonoscopy  10 years ago    dr Laural Golden  . Colonoscopy  12/07/2010    Procedure: COLONOSCOPY;  Surgeon: Rogene Houston, MD;  Location: [facility];  Service: Endoscopy;  Laterality: N/A;  . Tubal ligation  1976    Current Outpatient Prescriptions  Medication Sig Dispense Refill  . diazepam (VALIUM) 5 MG tablet       . estrogen, conjugated,-medroxyprogesterone (PREMPRO) 0.625-2.5 MG per tablet Take 1 tablet by mouth daily.  90 tablet  3  . fish oil-omega-3 fatty acids 1000 MG capsule Take 1,000 g by mouth daily.        . Multiple Vitamin (MULTIVITAMIN) capsule Take 1 capsule by mouth daily.        . Vitamin D, Ergocalciferol, (DRISDOL) 50000 UNITS CAPS Take 50,000 Units by mouth every 30 (thirty) days.       No current facility-administered medications for this visit.     Allergies  Allergen Reactions  . Latex      Family History  Problem Relation Age of Onset  . Diabetes Brother   . Cancer Maternal Grandfather   . Heart failure Mother   . Aneurysm Mother     abdominal     History   Social History  . Marital Status: Married    Spouse Name: N/A    Number of Children: 2  . Years of Education: N/A   Occupational History  . retired    Social History Main Topics  . Smoking status: Former Smoker    Quit  date: 11/22/1975  . Smokeless tobacco: Never Used  . Alcohol Use: No  . Drug Use: No  . Sexual Activity: Yes    Partners: Male    Birth Control/ Protection: Post-menopausal, Surgical     Comment: BTSP   Other Topics Concern  . None   Social History Narrative  . None     REVIEW OF SYSTEMS - PERTINENT POSITIVES ONLY: 12 point review of systems negative other than HPI and PMH   EXAM: Filed Vitals:   12/14/13 1520  BP: 140/88  Pulse: 71  Temp: 97.7 F (36.5 C)  Resp: 16    Wt Readings from Last 3 Encounters:  12/14/13 124 lb 9.6 oz (56.518 kg)  11/25/13 125 lb 9.6 oz (56.972 kg)  11/21/12 124 lb (56.246 kg)     Gen:  No acute distress.  Well nourished and well groomed.   Neurological: Alert and oriented to person, place, and time. Coordination normal.  Head: Normocephalic and atraumatic.  Eyes: Conjunctivae are normal. Pupils are equal, round, and reactive to light. No scleral icterus.  Neck: Normal range of motion. Neck supple. No  tracheal deviation or thyromegaly present.  Cardiovascular: Normal rate, regular rhythm, normal heart sounds and intact distal pulses.  Exam reveals no gallop and no friction rub.  No murmur heard. Breast: no palpable mass in either breast.  No lymphadenopathy.  No nipple retraction or skin dimpling.  No nipple discharge.  No peau d'orange.   Respiratory: Effort normal.  No respiratory distress. No chest wall tenderness. Breath sounds normal.  No wheezes, rales or rhonchi.  GI: Soft. Bowel sounds are normal. The abdomen is soft and nontender.  There is no rebound and no guarding.  Musculoskeletal: Normal range of motion. Extremities are nontender.  Lymphadenopathy: No cervical, preauricular, postauricular or axillary adenopathy is present Skin: Skin is warm and dry. No rash noted. No diaphoresis. No erythema. No pallor. No clubbing, cyanosis, or edema.   Psychiatric: Normal mood and affect. Behavior is normal. Judgment and thought content  normal.    LABORATORY RESULTS: Available labs are reviewed     RADIOLOGY RESULTS: See E-Chart or I-Site for most recent results.  Images and reports are reviewed.  Mr Breast Bilateral W Wo Contrast  12/11/2013   CLINICAL DATA:  Recent ultrasound-guided core biopsy of mass in the 1 o'clock location of the right breast shows invasive ductal carcinoma.  LABS:  BUN and creatinine were obtained on site at [facility] at  [address]  Results:  BUN 14 mg/dL,  Creatinine 0.8 mg/dL.  EXAM: BILATERAL BREAST MRI WITH AND WITHOUT CONTRAST  TECHNIQUE: Multiplanar, multisequence MR images of both breasts were obtained prior to and following the intravenous administration of 36m of MultiHance.  THREE-DIMENSIONAL MR IMAGE RENDERING ON INDEPENDENT WORKSTATION:  Three-dimensional MR images were rendered by post-processing of the original MR data on an independent workstation. The three-dimensional MR images were interpreted, and findings are reported in the following complete MRI report for this study. Three dimensional images were evaluated at the independent DynaCad workstation  COMPARISON:  [facility] from the [facility] and earlier  FINDINGS: Breast composition: d.  Extreme fibroglandular tissue.  Background parenchymal enhancement: Moderate  Right breast: Within the upper inner quadrant of the right breast there is an enhancing oval mass which measures 2.1 x 1.6 x 1.6 cm. Mass demonstrates rapid wash-in and washout type enhancement kinetics and contains marker clip artifact. Findings are consistent with known malignancy. No other suspicious enhancement identified on the right. There are scattered foci of enhancement throughout the breast.  Left breast: No mass or abnormal enhancement. There are scattered foci of enhancement throughout the breast.  Lymph nodes: No abnormal appearing lymph nodes.  Ancillary findings: Within the right hepatic lobe there is a 3  cm lesion demonstrating typical enhancement characteristics for a hemangioma. Within the left hepatic lobe there is a second 3 cm not fully characterized on T1 weighted images.  IMPRESSION: 1. Known malignancy within the upper inner quadrant of the right breast. 2. No evidence for additional ipsilateral or contralateral disease. 3. Two 3 cm hepatic lesions. One is typical for hemangioma. The second is not characterized.  RECOMMENDATION: 1. Treatment plan for known right breast malignancy. 2. Recommend ultrasound correlation for the liver lesions.  BI-RADS CATEGORY  6: Known biopsy-proven malignancy.   Electronically Signed   By: BShon HaleM.D.   On: 12/11/2013 13:48   Mm Digital Diagnostic Unilat R  12/04/2013   CLINICAL DATA:  Status post ultrasound-guided core biopsy of right breast mass.  EXAM: DIAGNOSTIC RIGHT MAMMOGRAM POST ULTRASOUND  BIOPSY  COMPARISON:  Previous exams  FINDINGS: Mammographic images were obtained following ultrasound guided biopsy of a mass in the 1 o'clock region of the right breast. Mammographic images show there is a coil shaped clip associated with the mass in the 1 o'clock region of the right breast.  IMPRESSION: Status post ultrasound-guided core biopsy of the right breast with pathology pending.  Final Assessment: Post Procedure Mammograms for Marker Placement   Electronically Signed   By: Lillia Mountain M.D.   On: 12/04/2013 12:09   Mm Digital Diagnostic Unilat R  12/01/2013   CLINICAL DATA:  68 year old female with possible right breast distortion/ mass on screening mammogram.  EXAM: DIGITAL DIAGNOSTIC  RIGHT MAMMOGRAM WITH CAD  ULTRASOUND RIGHT BREAST  COMPARISON:  11/13/2013 screening mammogram. Prior mammograms dating back to 10/15/2007  ACR Breast Density Category c: The breast tissue is heterogeneously dense, which may obscure small masses.  FINDINGS: CC spot compression and ML views of the right breast demonstrate a 1 cm focal asymmetry within the slightly upper inner right  breast at the junction of the middle and posterior thirds.  Mammographic images were processed with CAD.  On physical exam, no palpable abnormalities identified within the right breast.  Ultrasound is performed, showing a 0.9 x 1.1 x 1.4 cm partially circumscribed and partially obscured hypoechoic oval mass at the 1 o'clock position of the right breast 2 cm from the nipple. There may be a few internal calcifications, but not definite.  No enlarged or abnormal appearing right axillary lymph nodes are identified.  IMPRESSION: Indeterminate 0.9 x 1.1 x 1.4 cm hypoechoic mass in the upper inner right breast. Tissue sampling is recommended.  RECOMMENDATION: Ultrasound-guided right breast biopsy, which has been scheduled for 12/04/2013 and the patient informed.  I have discussed the findings and recommendations with the patient. Results were also provided in writing at the conclusion of the visit. If applicable, a reminder letter will be sent to the patient regarding the next appointment.  BI-RADS CATEGORY  4: Suspicious.   Electronically Signed   By: Hassan Rowan M.D.   On: 12/01/2013 11:33   US Breast Ltd Uni Right Inc Axilla  12/01/2013   CLINICAL DATA:  68 year old female with possible right breast distortion/ mass on screening mammogram.  EXAM: DIGITAL DIAGNOSTIC  RIGHT MAMMOGRAM WITH CAD  ULTRASOUND RIGHT BREAST  COMPARISON:  11/13/2013 screening mammogram. Prior mammograms dating back to 10/15/2007  ACR Breast Density Category c: The breast tissue is heterogeneously dense, which may obscure small masses.  FINDINGS: CC spot compression and ML views of the right breast demonstrate a 1 cm focal asymmetry within the slightly upper inner right breast at the junction of the middle and posterior thirds.  Mammographic images were processed with CAD.  On physical exam, no palpable abnormalities identified within the right breast.  Ultrasound is performed, showing a 0.9 x 1.1 x 1.4 cm partially circumscribed and partially  obscured hypoechoic oval mass at the 1 o'clock position of the right breast 2 cm from the nipple. There may be a few internal calcifications, but not definite.  No enlarged or abnormal appearing right axillary lymph nodes are identified.  IMPRESSION: Indeterminate 0.9 x 1.1 x 1.4 cm hypoechoic mass in the upper inner right breast. Tissue sampling is recommended.  RECOMMENDATION: Ultrasound-guided right breast biopsy, which has been scheduled for 12/04/2013 and the patient informed.  I have discussed the findings and recommendations with the patient. Results were also provided in writing at the conclusion of  the visit. If applicable, a reminder letter will be sent to the patient regarding the next appointment.  BI-RADS CATEGORY  4: Suspicious.   Electronically Signed   By: Hassan Rowan M.D.   On: 12/01/2013 11:33   [place] Rt Breast Bx W Loc Dev 1st Lesion Img Bx Spec US Guide  12/07/2013   ADDENDUM REPORT: 12/07/2013 12:36  ADDENDUM: Pathology revealed grade 1 to 2 invasive ductal carcinoma in the right breast. This was found to be concordant by Dr. Lillia Mountain. Pathology was discussed with the patient by telephone. She reported doing well after the biopsy. Post biopsy instructions were reviewed and her questions were answered. Surgical consultation has been scheduled with Dr. Stark Klein at [facility] on December 14, 2013, and a bilateral breast MRI has been scheduled for December 11, 2013. She was encouraged to come to [facility] for educational materials. My number was provided for future questions and concerns.  Pathology results reported by Susa Raring RN, BSN on December 07, 2013.   Electronically Signed   By: Lillia Mountain M.D.   On: 12/07/2013 12:36   12/07/2013   CLINICAL DATA:  Suspicious right breast mass.  EXAM: ULTRASOUND GUIDED RIGHT BREAST CORE NEEDLE BIOPSY  COMPARISON:  Previous exams.  PROCEDURE: I met with the patient and we discussed the procedure of  ultrasound-guided biopsy, including benefits and alternatives. We discussed the high likelihood of a successful procedure. We discussed the risks of the procedure including infection, bleeding, tissue injury, clip migration, and inadequate sampling. Informed written consent was given. The usual time-out protocol was performed immediately prior to the procedure.  Using sterile technique and 2% Lidocaine as local anesthetic, under direct ultrasound visualization, a 12 gauge vacuum-assisteddevice was used to perform biopsy of a mass in the 1 o'clock region the right breast using an inferior to superior approach. At the conclusion of the procedure, a coil shaped tissue marker clip was deployed into the biopsy cavity. Follow-up 2-view mammogram was performed and dictated separately.  IMPRESSION: Ultrasound-guided biopsy of the right breast. No apparent complications.  Electronically Signed: By: Lillia Mountain M.D. On: 12/04/2013 12:07      ASSESSMENT AND PLAN: Breast cancer of upper-inner quadrant of right female breast Clinical T2N0 (2.1 cm) right breast cancer +/+/-. This is amenable to breast conservation surgery.  We will set her up for radiation oncology and medical oncology consultations.  I discussed a right seed localized radioactive lumpectomy with sentinel lymph node biopsy with the patient and her husband.   We reviewed the surgery, incision, risks and benefits.  We discussed the risks of bleeding, infection, damage to adjacent structures, heart or lung problems, etc.    I reviewed the timeline of surgery, recovery, and restrictions.  I discussed that she would need to hold her fish oil for 1 week prior to surgery.    30 min spent in counseling.     She is stopping her HRT as well.     Milus Height MD Surgical Oncology, General and [facility]      Visit Diagnoses: 1. Breast cancer, right   2. Breast cancer of upper-inner quadrant of right  female breast     Primary Care Physician: Asencion Noble, MD  Other care team Shon Hale, MD

## 2013-12-14 NOTE — Assessment & Plan Note (Addendum)
Clinical T2N0 (2.1 cm) right breast cancer +/+/-. This is amenable to breast conservation surgery.  We will set her up for radiation oncology and medical oncology consultations.  I discussed a right seed localized radioactive lumpectomy with sentinel lymph node biopsy with the patient and her husband.   We reviewed the surgery, incision, risks and benefits.  We discussed the risks of bleeding, infection, damage to adjacent structures, heart or lung problems, etc.    I reviewed the timeline of surgery, recovery, and restrictions.  I discussed that she would need to hold her fish oil for 1 week prior to surgery.    30 min spent in counseling.

## 2013-12-15 ENCOUNTER — Other Ambulatory Visit (INDEPENDENT_AMBULATORY_CARE_PROVIDER_SITE_OTHER): Payer: Self-pay | Admitting: General Surgery

## 2013-12-15 DIAGNOSIS — C50911 Malignant neoplasm of unspecified site of right female breast: Secondary | ICD-10-CM

## 2013-12-15 NOTE — Addendum Note (Signed)
Addended by: Ivor Costa on: 12/15/2013 09:59 AM   Modules accepted: Orders

## 2013-12-16 ENCOUNTER — Other Ambulatory Visit (INDEPENDENT_AMBULATORY_CARE_PROVIDER_SITE_OTHER): Payer: Self-pay | Admitting: General Surgery

## 2013-12-16 DIAGNOSIS — C50911 Malignant neoplasm of unspecified site of right female breast: Secondary | ICD-10-CM

## 2013-12-17 ENCOUNTER — Telehealth: Payer: Self-pay | Admitting: *Deleted

## 2013-12-17 ENCOUNTER — Encounter (HOSPITAL_BASED_OUTPATIENT_CLINIC_OR_DEPARTMENT_OTHER): Payer: Self-pay | Admitting: *Deleted

## 2013-12-17 NOTE — Progress Notes (Signed)
To come in for [facility] labs after seeds 12/22/13

## 2013-12-17 NOTE — Telephone Encounter (Signed)
Called pt and confirmed 01/11/14 appt w/ her.  Mailed before appt letter, welcoming packet & intake form to pt.  Emailed Jearld Fenton and [place] at [employer] to make them aware.

## 2013-12-21 ENCOUNTER — Ambulatory Visit
Admission: RE | Admit: 2013-12-21 | Discharge: 2013-12-21 | Disposition: A | Discharge status: Home / Self Care | Payer: Medicare PPO | Source: Ambulatory Visit | Attending: General Surgery | Admitting: General Surgery

## 2013-12-21 DIAGNOSIS — R932 Abnormal findings on diagnostic imaging of liver and biliary tract: Secondary | ICD-10-CM

## 2013-12-22 ENCOUNTER — Encounter (HOSPITAL_BASED_OUTPATIENT_CLINIC_OR_DEPARTMENT_OTHER)
Admission: RE | Admit: 2013-12-22 | Discharge: 2013-12-22 | Disposition: A | Discharge status: Home / Self Care | Payer: Medicare PPO | Source: Ambulatory Visit | Attending: General Surgery | Admitting: General Surgery

## 2013-12-22 ENCOUNTER — Ambulatory Visit
Admission: RE | Admit: 2013-12-22 | Discharge: 2013-12-22 | Disposition: A | Discharge status: Home / Self Care | Payer: Medicare PPO | Source: Ambulatory Visit | Attending: General Surgery | Admitting: General Surgery

## 2013-12-22 DIAGNOSIS — C50911 Malignant neoplasm of unspecified site of right female breast: Secondary | ICD-10-CM

## 2013-12-22 DIAGNOSIS — Z87891 Personal history of nicotine dependence: Secondary | ICD-10-CM | POA: Diagnosis not present

## 2013-12-22 DIAGNOSIS — C50219 Malignant neoplasm of upper-inner quadrant of unspecified female breast: Secondary | ICD-10-CM | POA: Diagnosis present

## 2013-12-22 DIAGNOSIS — Z79899 Other long term (current) drug therapy: Secondary | ICD-10-CM | POA: Diagnosis not present

## 2013-12-22 DIAGNOSIS — M810 Age-related osteoporosis without current pathological fracture: Secondary | ICD-10-CM | POA: Diagnosis not present

## 2013-12-22 LAB — CBC WITH DIFFERENTIAL/PLATELET
Basophils Absolute: 0 10*3/uL (ref 0.0–0.1)
Basophils Relative: 0 % (ref 0–1)
EOS ABS: 0.1 10*3/uL (ref 0.0–0.7)
Eosinophils Relative: 1 % (ref 0–5)
HCT: 42 % (ref 36.0–46.0)
HEMOGLOBIN: 14.7 g/dL (ref 12.0–15.0)
LYMPHS ABS: 1.9 10*3/uL (ref 0.7–4.0)
Lymphocytes Relative: 32 % (ref 12–46)
MCH: 34.9 pg — AB (ref 26.0–34.0)
MCHC: 35 g/dL (ref 30.0–36.0)
MCV: 99.8 fL (ref 78.0–100.0)
MONO ABS: 0.4 10*3/uL (ref 0.1–1.0)
MONOS PCT: 7 % (ref 3–12)
Neutro Abs: 3.5 10*3/uL (ref 1.7–7.7)
Neutrophils Relative %: 60 % (ref 43–77)
Platelets: 233 10*3/uL (ref 150–400)
RBC: 4.21 MIL/uL (ref 3.87–5.11)
RDW: 11.5 % (ref 11.5–15.5)
WBC: 5.8 10*3/uL (ref 4.0–10.5)

## 2013-12-22 LAB — BASIC METABOLIC PANEL
Anion gap: 12 (ref 5–15)
BUN: 11 mg/dL (ref 6–23)
CHLORIDE: 103 meq/L (ref 96–112)
CO2: 27 mEq/L (ref 19–32)
CREATININE: 0.72 mg/dL (ref 0.50–1.10)
Calcium: 9 mg/dL (ref 8.4–10.5)
GFR calc Af Amer: >90 mL/min (ref >90)
GFR, EST NON AFRICAN AMERICAN: 85 mL/min — AB (ref >90)
Glucose, Bld: 88 mg/dL (ref 70–99)
Potassium: 3.8 mEq/L (ref 3.7–5.3)
Sodium: 142 mEq/L (ref 137–147)

## 2013-12-23 ENCOUNTER — Ambulatory Visit (HOSPITAL_BASED_OUTPATIENT_CLINIC_OR_DEPARTMENT_OTHER): Payer: Medicare PPO | Admitting: Anesthesiology

## 2013-12-23 ENCOUNTER — Ambulatory Visit
Admission: RE | Admit: 2013-12-23 | Discharge: 2013-12-23 | Disposition: A | Discharge status: Home / Self Care | Payer: Medicare PPO | Source: Ambulatory Visit | Attending: General Surgery | Admitting: General Surgery

## 2013-12-23 ENCOUNTER — Encounter (HOSPITAL_BASED_OUTPATIENT_CLINIC_OR_DEPARTMENT_OTHER)
Admission: RE | Disposition: A | Discharge status: Home / Self Care | Payer: Self-pay | Source: Ambulatory Visit | Attending: General Surgery

## 2013-12-23 ENCOUNTER — Ambulatory Visit (HOSPITAL_BASED_OUTPATIENT_CLINIC_OR_DEPARTMENT_OTHER)
Admission: RE | Admit: 2013-12-23 | Discharge: 2013-12-23 | Disposition: A | Discharge status: Home / Self Care | Payer: Medicare PPO | Source: Ambulatory Visit | Attending: General Surgery | Admitting: General Surgery

## 2013-12-23 ENCOUNTER — Encounter (HOSPITAL_BASED_OUTPATIENT_CLINIC_OR_DEPARTMENT_OTHER): Payer: Self-pay | Admitting: *Deleted

## 2013-12-23 ENCOUNTER — Encounter (HOSPITAL_BASED_OUTPATIENT_CLINIC_OR_DEPARTMENT_OTHER): Payer: Medicare PPO | Admitting: Anesthesiology

## 2013-12-23 ENCOUNTER — Telehealth (INDEPENDENT_AMBULATORY_CARE_PROVIDER_SITE_OTHER): Payer: Self-pay

## 2013-12-23 ENCOUNTER — Ambulatory Visit (HOSPITAL_COMMUNITY)
Admission: RE | Admit: 2013-12-23 | Discharge: 2013-12-23 | Disposition: A | Discharge status: Home / Self Care | Payer: Medicare PPO | Source: Ambulatory Visit | Attending: General Surgery | Admitting: General Surgery

## 2013-12-23 DIAGNOSIS — Z87891 Personal history of nicotine dependence: Secondary | ICD-10-CM | POA: Insufficient documentation

## 2013-12-23 DIAGNOSIS — C50911 Malignant neoplasm of unspecified site of right female breast: Secondary | ICD-10-CM

## 2013-12-23 DIAGNOSIS — Z79899 Other long term (current) drug therapy: Secondary | ICD-10-CM | POA: Insufficient documentation

## 2013-12-23 DIAGNOSIS — M810 Age-related osteoporosis without current pathological fracture: Secondary | ICD-10-CM | POA: Diagnosis not present

## 2013-12-23 DIAGNOSIS — C50219 Malignant neoplasm of upper-inner quadrant of unspecified female breast: Secondary | ICD-10-CM | POA: Insufficient documentation

## 2013-12-23 SURGERY — RADIOACTIVE SEED GUIDED PARTIAL MASTECTOMY WITH AXILLARY SENTINEL LYMPH NODE BIOPSY
Anesthesia: Regional | Laterality: Right

## 2013-12-23 MED ORDER — OXYCODONE-ACETAMINOPHEN 5-325 MG PO TABS: 1.0000 | ORAL_TABLET | ORAL | Status: DC | PRN

## 2013-12-23 MED ORDER — OXYCODONE HCL 5 MG PO TABS
ORAL_TABLET | ORAL | Status: AC
  Filled 2013-12-23: qty 1

## 2013-12-23 MED ORDER — METHYLENE BLUE 1 % INJ SOLN
INTRAMUSCULAR | Status: AC
  Filled 2013-12-23: qty 10

## 2013-12-23 MED ORDER — DIPHENHYDRAMINE HCL 50 MG/ML IJ SOLN
12.5000 mg | Freq: Once | INTRAMUSCULAR | Status: AC
  Administered 2013-12-23: 10 mg via INTRAVENOUS

## 2013-12-23 MED ORDER — DEXAMETHASONE SODIUM PHOSPHATE 4 MG/ML IJ SOLN
INTRAMUSCULAR | Status: DC | PRN
  Administered 2013-12-23: 4 mg via INTRAVENOUS

## 2013-12-23 MED ORDER — CEFAZOLIN SODIUM-DEXTROSE 2-3 GM-% IV SOLR
INTRAVENOUS | Status: AC
  Filled 2013-12-23: qty 50

## 2013-12-23 MED ORDER — CEFAZOLIN SODIUM-DEXTROSE 2-3 GM-% IV SOLR
2.0000 g | INTRAVENOUS | Status: AC
  Administered 2013-12-23: 2 g via INTRAVENOUS

## 2013-12-23 MED ORDER — FENTANYL CITRATE 0.05 MG/ML IJ SOLN
50.0000 ug | INTRAMUSCULAR | Status: DC | PRN
  Administered 2013-12-23: 100 ug via INTRAVENOUS

## 2013-12-23 MED ORDER — 0.9 % SODIUM CHLORIDE (POUR BTL) OPTIME
TOPICAL | Status: DC | PRN
  Administered 2013-12-23: 1000 mL

## 2013-12-23 MED ORDER — FENTANYL CITRATE 0.05 MG/ML IJ SOLN
INTRAMUSCULAR | Status: AC
  Filled 2013-12-23: qty 2

## 2013-12-23 MED ORDER — MEPERIDINE HCL 25 MG/ML IJ SOLN: 6.2500 mg | INTRAMUSCULAR | Status: DC | PRN

## 2013-12-23 MED ORDER — BUPIVACAINE-EPINEPHRINE (PF) 0.5% -1:200000 IJ SOLN
INTRAMUSCULAR | Status: DC | PRN
  Administered 2013-12-23: 25 mL

## 2013-12-23 MED ORDER — LACTATED RINGERS IV SOLN
INTRAVENOUS | Status: DC
  Administered 2013-12-23 (×2): via INTRAVENOUS

## 2013-12-23 MED ORDER — SODIUM CHLORIDE 0.9 % IJ SOLN
INTRAMUSCULAR | Status: AC
  Filled 2013-12-23: qty 10

## 2013-12-23 MED ORDER — FENTANYL CITRATE 0.05 MG/ML IJ SOLN
INTRAMUSCULAR | Status: AC
  Filled 2013-12-23: qty 4

## 2013-12-23 MED ORDER — LIDOCAINE-EPINEPHRINE (PF) 1 %-1:200000 IJ SOLN
INTRAMUSCULAR | Status: AC
  Filled 2013-12-23: qty 10

## 2013-12-23 MED ORDER — TECHNETIUM TC 99M SULFUR COLLOID FILTERED
1.0000 | Freq: Once | INTRAVENOUS | Status: AC | PRN
  Administered 2013-12-23: 1 via INTRADERMAL

## 2013-12-23 MED ORDER — LIDOCAINE-EPINEPHRINE (PF) 1 %-1:200000 IJ SOLN
INTRAMUSCULAR | Status: DC | PRN
  Administered 2013-12-23: 13:00:00

## 2013-12-23 MED ORDER — HYDROMORPHONE HCL PF 1 MG/ML IJ SOLN
0.2500 mg | INTRAMUSCULAR | Status: DC | PRN
  Administered 2013-12-23 (×3): 0.25 mg via INTRAVENOUS

## 2013-12-23 MED ORDER — LIDOCAINE HCL (CARDIAC) 20 MG/ML IV SOLN
INTRAVENOUS | Status: DC | PRN
  Administered 2013-12-23: 25 mg via INTRAVENOUS

## 2013-12-23 MED ORDER — ONDANSETRON HCL 4 MG/2ML IJ SOLN
INTRAMUSCULAR | Status: DC | PRN
  Administered 2013-12-23: 4 mg via INTRAVENOUS

## 2013-12-23 MED ORDER — PROMETHAZINE HCL 25 MG/ML IJ SOLN: 6.2500 mg | INTRAMUSCULAR | Status: DC | PRN

## 2013-12-23 MED ORDER — OXYCODONE HCL 5 MG PO TABS
5.0000 mg | ORAL_TABLET | Freq: Once | ORAL | Status: AC | PRN
  Administered 2013-12-23: 5 mg via ORAL

## 2013-12-23 MED ORDER — BUPIVACAINE HCL (PF) 0.25 % IJ SOLN
INTRAMUSCULAR | Status: AC
  Filled 2013-12-23: qty 30

## 2013-12-23 MED ORDER — PROPOFOL 10 MG/ML IV BOLUS
INTRAVENOUS | Status: DC | PRN
  Administered 2013-12-23: 120 mg via INTRAVENOUS

## 2013-12-23 MED ORDER — FENTANYL CITRATE 0.05 MG/ML IJ SOLN
INTRAMUSCULAR | Status: DC | PRN
  Administered 2013-12-23 (×2): 25 ug via INTRAVENOUS

## 2013-12-23 MED ORDER — MIDAZOLAM HCL 2 MG/2ML IJ SOLN
1.0000 mg | INTRAMUSCULAR | Status: DC | PRN
  Administered 2013-12-23: 2 mg via INTRAVENOUS

## 2013-12-23 MED ORDER — HYDROMORPHONE HCL PF 1 MG/ML IJ SOLN
INTRAMUSCULAR | Status: AC
  Filled 2013-12-23: qty 1

## 2013-12-23 MED ORDER — MIDAZOLAM HCL 2 MG/2ML IJ SOLN
INTRAMUSCULAR | Status: AC
  Filled 2013-12-23: qty 2

## 2013-12-23 MED ORDER — EPHEDRINE SULFATE 50 MG/ML IJ SOLN
INTRAMUSCULAR | Status: DC | PRN
  Administered 2013-12-23 (×2): 10 mg via INTRAVENOUS

## 2013-12-23 MED ORDER — PROPOFOL 10 MG/ML IV BOLUS
INTRAVENOUS | Status: AC
  Filled 2013-12-23: qty 40

## 2013-12-23 MED ORDER — OXYCODONE HCL 5 MG/5ML PO SOLN: 5.0000 mg | Freq: Once | ORAL | Status: AC | PRN

## 2013-12-23 MED ORDER — MIDAZOLAM HCL 2 MG/2ML IJ SOLN: 0.5000 mg | Freq: Once | INTRAMUSCULAR | Status: DC | PRN

## 2013-12-23 SURGICAL SUPPLY — 69 items
ADH SKN CLS APL DERMABOND .7 (GAUZE/BANDAGES/DRESSINGS) ×1
APPLIER CLIP 9.375 MED OPEN (MISCELLANEOUS)
APR CLP MED 9.3 20 MLT OPN (MISCELLANEOUS)
BINDER BREAST LRG (GAUZE/BANDAGES/DRESSINGS) IMPLANT
BINDER BREAST MEDIUM (GAUZE/BANDAGES/DRESSINGS) ×2 IMPLANT
BINDER BREAST XLRG (GAUZE/BANDAGES/DRESSINGS) IMPLANT
BINDER BREAST XXLRG (GAUZE/BANDAGES/DRESSINGS) IMPLANT
BLADE HEX COATED 2.75 (ELECTRODE) ×3 IMPLANT
BLADE SURG 10 STRL SS (BLADE) ×3 IMPLANT
BLADE SURG 15 STRL LF DISP TIS (BLADE) ×1 IMPLANT
BLADE SURG 15 STRL SS (BLADE) ×3
BNDG COHESIVE 4X5 TAN STRL (GAUZE/BANDAGES/DRESSINGS) ×3 IMPLANT
CANISTER SUC SOCK COL 7IN (MISCELLANEOUS) IMPLANT
CANISTER SUCT 1200ML W/VALVE (MISCELLANEOUS) ×2 IMPLANT
CHLORAPREP W/TINT 26ML (MISCELLANEOUS) ×3 IMPLANT
CLIP APPLIE 9.375 MED OPEN (MISCELLANEOUS) IMPLANT
CLIP TI LARGE 6 (CLIP) ×5 IMPLANT
CLIP TI MEDIUM 6 (CLIP) ×3 IMPLANT
CLIP TI WIDE RED SMALL 6 (CLIP) IMPLANT
CLOSURE WOUND 1/2 X4 (GAUZE/BANDAGES/DRESSINGS) ×1
COVER MAYO STAND STRL (DRAPES) ×3 IMPLANT
COVER PROBE W GEL 5X96 (DRAPES) ×3 IMPLANT
DECANTER SPIKE VIAL GLASS SM (MISCELLANEOUS) IMPLANT
DERMABOND ADVANCED (GAUZE/BANDAGES/DRESSINGS) ×2
DERMABOND ADVANCED .7 DNX12 (GAUZE/BANDAGES/DRESSINGS) ×1 IMPLANT
DEVICE DUBIN W/COMP PLATE 8390 (MISCELLANEOUS) ×3 IMPLANT
DRAPE UTILITY XL STRL (DRAPES) ×3 IMPLANT
ELECT REM PT RETURN 9FT ADLT (ELECTROSURGICAL) ×3
ELECTRODE REM PT RTRN 9FT ADLT (ELECTROSURGICAL) ×1 IMPLANT
GLOVE BIO SURGEON STRL SZ 6 (GLOVE) ×1 IMPLANT
GLOVE BIOGEL PI IND STRL 6.5 (GLOVE) ×1 IMPLANT
GLOVE BIOGEL PI IND STRL 7.0 (GLOVE) IMPLANT
GLOVE BIOGEL PI INDICATOR 6.5 (GLOVE) ×2
GLOVE BIOGEL PI INDICATOR 7.0 (GLOVE) ×2
GLOVE EXAM NITRILE MD LF STRL (GLOVE) ×2 IMPLANT
GLOVE SURG SS PI 6.0 STRL IVOR (GLOVE) ×2 IMPLANT
GLOVE SURG SS PI 6.5 STRL IVOR (GLOVE) ×4 IMPLANT
GOWN STRL REUS W/ TWL LRG LVL3 (GOWN DISPOSABLE) ×1 IMPLANT
GOWN STRL REUS W/ TWL XL LVL3 (GOWN DISPOSABLE) ×1 IMPLANT
GOWN STRL REUS W/TWL LRG LVL3 (GOWN DISPOSABLE) ×6
GOWN STRL REUS W/TWL XL LVL3 (GOWN DISPOSABLE) ×3
KIT MARKER MARGIN INK (KITS) ×3 IMPLANT
NDL HYPO 25X1 1.5 SAFETY (NEEDLE) ×1 IMPLANT
NEEDLE HYPO 25X1 1.5 SAFETY (NEEDLE) ×3 IMPLANT
NS IRRIG 1000ML POUR BTL (IV SOLUTION) ×2 IMPLANT
PACK BASIN DAY SURGERY FS (CUSTOM PROCEDURE TRAY) ×3 IMPLANT
PACK UNIVERSAL I (CUSTOM PROCEDURE TRAY) ×3 IMPLANT
PENCIL BUTTON HOLSTER BLD 10FT (ELECTRODE) ×3 IMPLANT
SHEET MEDIUM DRAPE 40X70 STRL (DRAPES) IMPLANT
SLEEVE SCD COMPRESS KNEE MED (MISCELLANEOUS) ×3 IMPLANT
SPONGE GAUZE 4X4 12PLY STER LF (GAUZE/BANDAGES/DRESSINGS) ×3 IMPLANT
SPONGE LAP 18X18 X RAY DECT (DISPOSABLE) ×3 IMPLANT
STOCKINETTE IMPERVIOUS LG (DRAPES) ×3 IMPLANT
STRIP CLOSURE SKIN 1/2X4 (GAUZE/BANDAGES/DRESSINGS) ×2 IMPLANT
SUT ETHILON 2 0 FS 18 (SUTURE) IMPLANT
SUT MNCRL AB 4-0 PS2 18 (SUTURE) ×3 IMPLANT
SUT MON AB 5-0 PS2 18 (SUTURE) IMPLANT
SUT SILK 2 0 SH (SUTURE) IMPLANT
SUT VIC AB 2-0 SH 27 (SUTURE) ×3
SUT VIC AB 2-0 SH 27XBRD (SUTURE) ×1 IMPLANT
SUT VIC AB 3-0 SH 27 (SUTURE) ×3
SUT VIC AB 3-0 SH 27X BRD (SUTURE) ×1 IMPLANT
SUT VIC AB 5-0 PS2 18 (SUTURE) IMPLANT
SYR CONTROL 10ML LL (SYRINGE) ×3 IMPLANT
TOWEL OR 17X24 6PK STRL BLUE (TOWEL DISPOSABLE) ×3 IMPLANT
TOWEL OR NON WOVEN STRL DISP B (DISPOSABLE) ×3 IMPLANT
TUBE CONNECTING 20'X1/4 (TUBING) ×1
TUBE CONNECTING 20X1/4 (TUBING) ×2 IMPLANT
YANKAUER SUCT BULB TIP NO VENT (SUCTIONS) ×2 IMPLANT

## 2013-12-23 NOTE — Progress Notes (Signed)
Assisted Dr. Jackson with right, ultrasound guided, pectoralis block. Side rails up, monitors on throughout procedure. See vital signs in flow sheet. Tolerated Procedure well. 

## 2013-12-23 NOTE — Anesthesia Procedure Notes (Signed)
Anesthesia Regional Block:  Pectoralis block  Pre-Anesthetic Checklist: ,, timeout performed, Correct Patient, Correct Site, Correct Laterality, Correct Procedure, Correct Position, site marked, Risks and benefits discussed,  Surgical consent,  Pre-op evaluation,  At surgeon's request and post-op pain management  Laterality: Right  Prep: chloraprep       Needles:  Injection technique: Single-shot     Needle Length: 9cm 9 cm Needle Gauge: 22 and 22 G    Additional Needles:  Procedures: ultrasound guided (picture in chart) Pectoralis block Narrative:  Start time: 12/23/2013 10:25 AM End time: 12/23/2013 10:34 AM Injection made incrementally with aspirations every 5 mL.  Performed by: Personally  Anesthesiologist: Jenita Seashore, MD  Additional Notes: Pt identified in Holding room.  Monitors applied. Working IV access confirmed. Sterile prep R clavicle and chest.  #22ga ECHOgenic needle to 4th rib, under pectoralis, with US guidance.  25cc 0.5% Bupivacaine with 1:200k epi injected incrementally after negative test dose, good spread in plane to 3rd and 5th ribs.  Patient asymptomatic, VSS, no heme aspirated, tolerated well.  Jenita Seashore, MD

## 2013-12-23 NOTE — Anesthesia Postprocedure Evaluation (Signed)
  Anesthesia Post-op Note  Patient: Kaitlyn Porter  Procedure(s) Performed: Procedure(s): RADIOACTIVE SEED GUIDED PARTIAL MASTECTOMY WITH AXILLARY SENTINEL LYMPH NODE BIOPSY (Right)  Patient Location: PACU  Anesthesia Type:General  Level of Consciousness: awake, alert , oriented and patient cooperative  Airway and Oxygen Therapy: Patient Spontanous Breathing  Post-op Pain: none  Post-op Assessment: Post-op Vital signs reviewed, Patient's Cardiovascular Status Stable, Respiratory Function Stable, Patent Airway, No signs of Nausea or vomiting and Pain level controlled  Post-op Vital Signs: Reviewed and stable  Last Vitals:  Filed Vitals:   12/23/13 1400  BP: 143/72  Pulse: 92  Temp:   Resp: 16    Complications: No apparent anesthesia complications

## 2013-12-23 NOTE — Discharge Instructions (Addendum)
[place] Office Phone Number [phone]  BREAST BIOPSY/ Lumpectomy: POST OP INSTRUCTIONS  Always review your discharge instruction sheet given to you by the facility where your surgery was performed.  IF YOU HAVE DISABILITY OR FAMILY LEAVE FORMS, YOU MUST BRING THEM TO THE OFFICE FOR PROCESSING.  DO NOT GIVE THEM TO YOUR DOCTOR.  1. A prescription for pain medication may be given to you upon discharge.  Take your pain medication as prescribed, if needed.  If narcotic pain medicine is not needed, then you may take acetaminophen (Tylenol) or ibuprofen (Advil) as needed. 2. Take your usually prescribed medications unless otherwise directed 3. If you need a refill on your pain medication, please contact your pharmacy.  They will contact our office to request authorization.  Prescriptions will not be filled after 5pm or on week-ends. 4. You should eat very light the first 24 hours after surgery, such as soup, crackers, pudding, etc.  Resume your normal diet the day after surgery. 5. Most patients will experience some swelling and bruising in the breast.  Ice packs and a good support bra will help.  Swelling and bruising can take several days to resolve.  6. It is common to experience some constipation if taking pain medication after surgery.  Increasing fluid intake and taking a stool softener will usually help or prevent this problem from occurring.  A mild laxative (Milk of Magnesia or Miralax) should be taken according to package directions if there are no bowel movements after 48 hours. 7. Unless discharge instructions indicate otherwise, you may remove your bandages 48 hours after surgery, and you may shower at that time.  You may have steri-strips (small skin tapes) in place directly over the incision.  These strips should be left on the skin for 7-10 days.   Any sutures or staples will be removed at the office during your follow-up visit. 8. ACTIVITIES:  You may resume regular  daily activities (gradually increasing) beginning the next day.  Wearing a good support bra or sports bra (or the breast binder) minimizes pain and swelling.  You may have sexual intercourse when it is comfortable. a. You may drive when you no longer are taking prescription pain medication, you can comfortably wear a seatbelt, and you can safely maneuver your car and apply brakes. b. RETURN TO WORK:  __________1-2 week_______________ 9. You should see your doctor in the office for a follow-up appointment approximately two weeks after your surgery.  Your doctors nurse will typically make your follow-up appointment when she calls you with your pathology report.  Expect your pathology report 2-3 business days after your surgery.  You may call to check if you do not hear from [place] after three days.   WHEN TO CALL YOUR DOCTOR: 1. Fever over 101.0 2. Nausea and/or vomiting. 3. Extreme swelling or bruising. 4. Continued bleeding from incision. 5. Increased pain, redness, or drainage from the incision.  The clinic staff is available to answer your questions during regular business hours.  Please dont hesitate to call and ask to speak to one of the nurses for clinical concerns.  If you have a medical emergency, go to the nearest emergency room or call 911.  A surgeon from [facility] is always on call at the hospital.  For further questions, please visit [email or web address]    Post Anesthesia Home Care Instructions  Activity: Get plenty of rest for the remainder of the day. A responsible adult should stay with you for 24 hours  following the procedure.  For the next 24 hours, DO NOT: -Drive a car -Paediatric nurse -Drink alcoholic beverages -Take any medication unless instructed by your physician -Make any legal decisions or sign important papers.  Meals: Start with liquid foods such as gelatin or soup. Progress to regular foods as tolerated. Avoid greasy, spicy, heavy foods.  If nausea and/or vomiting occur, drink only clear liquids until the nausea and/or vomiting subsides. Call your physician if vomiting continues.  Special Instructions/Symptoms: Your throat may feel dry or sore from the anesthesia or the breathing tube placed in your throat during surgery. If this causes discomfort, gargle with warm salt water. The discomfort should disappear within 24 hours.

## 2013-12-23 NOTE — Anesthesia Preprocedure Evaluation (Addendum)
Anesthesia Evaluation  Patient identified by MRN, date of birth, ID band Patient awake    Reviewed: Allergy & Precautions, H&P , NPO status , Patient's Chart, lab work & pertinent test results  History of Anesthesia Complications Negative for: history of anesthetic complications  Airway Mallampati: I TM Distance: >3 FB Neck ROM: Full    Dental  (+) Teeth Intact, Dental Advisory Given   Pulmonary former smoker (quit 1977),  breath sounds clear to auscultation  Pulmonary exam normal       Cardiovascular - anginanegative cardio ROS  Rhythm:Regular Rate:Normal     Neuro/Psych negative neurological ROS     GI/Hepatic negative GI ROS,   Endo/Other  negative endocrine ROS  Renal/GU negative Renal ROS     Musculoskeletal   Abdominal   Peds  Hematology   Anesthesia Other Findings Breast cancer  Reproductive/Obstetrics                          Anesthesia Physical Anesthesia Plan  ASA: II  Anesthesia Plan: General   Post-op Pain Management:    Induction: Intravenous  Airway Management Planned: LMA  Additional Equipment:   Intra-op Plan:   Post-operative Plan:   Informed Consent: I have reviewed the patients History and Physical, chart, labs and discussed the procedure including the risks, benefits and alternatives for the proposed anesthesia with the patient or authorized representative who has indicated his/her understanding and acceptance.   Dental advisory given  Plan Discussed with: CRNA and Surgeon  Anesthesia Plan Comments: (Plan routine monitors, GA- LMA OK, pectoralis block for post op analgesia)        Anesthesia Quick Evaluation

## 2013-12-23 NOTE — Interval H&P Note (Signed)
History and Physical Interval Note:  12/23/2013 10:44 AM  Kaitlyn Porter  has presented today for surgery, with the diagnosis of right breast cancer  The various methods of treatment have been discussed with the patient and family. After consideration of risks, benefits and other options for treatment, the patient has consented to  Procedure(s): RADIOACTIVE SEED GUIDED PARTIAL MASTECTOMY WITH AXILLARY SENTINEL LYMPH NODE BIOPSY (Right) as a surgical intervention .  The patient's history has been reviewed, patient examined, no change in status, stable for surgery.  I have reviewed the patient's chart and labs.  Questions were answered to the patient's satisfaction.     Sharmayne Jablon

## 2013-12-23 NOTE — H&P (View-Only) (Signed)
Chief complaint:  Right breast cancer  Referring MD: Dr. Arceo  HISTORY: Pt is a 68 yo F who presents with screening detected distortion in the right breast.  She has no family history of breast cancer.  She has been on HRT for many years.  This area was seen to be 1.4x1.1.0.9 cm at the 1 o'clock position on ultrasound.  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  MRI demonstrated a size of 2.1 x 1.6 x 1.6 cm.    Past Medical History  Diagnosis Date  . Osteoporosis   . Fibroids 12/2008    history of    Past Surgical History  Procedure Laterality Date  . Tonsillectomy    . Appendectomy    . Vaginal delivery      times 2  . Colonoscopy  10 years ago    dr rehman  . Colonoscopy  12/07/2010    Procedure: COLONOSCOPY;  Surgeon: Najeeb U Rehman, MD;  Location: [facility];  Service: Endoscopy;  Laterality: N/A;  . Tubal ligation  1976    Current Outpatient Prescriptions  Medication Sig Dispense Refill  . diazepam (VALIUM) 5 MG tablet       . estrogen, conjugated,-medroxyprogesterone (PREMPRO) 0.625-2.5 MG per tablet Take 1 tablet by mouth daily.  90 tablet  3  . fish oil-omega-3 fatty acids 1000 MG capsule Take 1,000 g by mouth daily.        . Multiple Vitamin (MULTIVITAMIN) capsule Take 1 capsule by mouth daily.        . Vitamin D, Ergocalciferol, (DRISDOL) 50000 UNITS CAPS Take 50,000 Units by mouth every 30 (thirty) days.       No current facility-administered medications for this visit.     Allergies  Allergen Reactions  . Latex      Family History  Problem Relation Age of Onset  . Diabetes Brother   . Cancer Maternal Grandfather   . Heart failure Mother   . Aneurysm Mother     abdominal     History   Social History  . Marital Status: Married    Spouse Name: N/A    Number of Children: 2  . Years of Education: N/A   Occupational History  . retired    Social History Main Topics  . Smoking status: Former Smoker    Quit  date: 11/22/1975  . Smokeless tobacco: Never Used  . Alcohol Use: No  . Drug Use: No  . Sexual Activity: Yes    Partners: Male    Birth Control/ Protection: Post-menopausal, Surgical     Comment: BTSP   Other Topics Concern  . None   Social History Narrative  . None     REVIEW OF SYSTEMS - PERTINENT POSITIVES ONLY: 12 point review of systems negative other than HPI and PMH   EXAM: Filed Vitals:   12/14/13 1520  BP: 140/88  Pulse: 71  Temp: 97.7 F (36.5 C)  Resp: 16    Wt Readings from Last 3 Encounters:  12/14/13 124 lb 9.6 oz (56.518 kg)  11/25/13 125 lb 9.6 oz (56.972 kg)  11/21/12 124 lb (56.246 kg)     Gen:  No acute distress.  Well nourished and well groomed.   Neurological: Alert and oriented to person, place, and time. Coordination normal.  Head: Normocephalic and atraumatic.  Eyes: Conjunctivae are normal. Pupils are equal, round, and reactive to light. No scleral icterus.  Neck: Normal range of motion. Neck supple. No   tracheal deviation or thyromegaly present.  Cardiovascular: Normal rate, regular rhythm, normal heart sounds and intact distal pulses.  Exam reveals no gallop and no friction rub.  No murmur heard. Breast: no palpable mass in either breast.  No lymphadenopathy.  No nipple retraction or skin dimpling.  No nipple discharge.  No peau d'orange.   Respiratory: Effort normal.  No respiratory distress. No chest wall tenderness. Breath sounds normal.  No wheezes, rales or rhonchi.  GI: Soft. Bowel sounds are normal. The abdomen is soft and nontender.  There is no rebound and no guarding.  Musculoskeletal: Normal range of motion. Extremities are nontender.  Lymphadenopathy: No cervical, preauricular, postauricular or axillary adenopathy is present Skin: Skin is warm and dry. No rash noted. No diaphoresis. No erythema. No pallor. No clubbing, cyanosis, or edema.   Psychiatric: Normal mood and affect. Behavior is normal. Judgment and thought content  normal.    LABORATORY RESULTS: Available labs are reviewed     RADIOLOGY RESULTS: See E-Chart or I-Site for most recent results.  Images and reports are reviewed.  Mr Breast Bilateral W Wo Contrast  12/11/2013   CLINICAL DATA:  Recent ultrasound-guided core biopsy of mass in the 1 o'clock location of the right breast shows invasive ductal carcinoma.  LABS:  BUN and creatinine were obtained on site at [facility] at  [address]  Results:  BUN 14 mg/dL,  Creatinine 0.8 mg/dL.  EXAM: BILATERAL BREAST MRI WITH AND WITHOUT CONTRAST  TECHNIQUE: Multiplanar, multisequence MR images of both breasts were obtained prior to and following the intravenous administration of 11ml of MultiHance.  THREE-DIMENSIONAL MR IMAGE RENDERING ON INDEPENDENT WORKSTATION:  Three-dimensional MR images were rendered by post-processing of the original MR data on an independent workstation. The three-dimensional MR images were interpreted, and findings are reported in the following complete MRI report for this study. Three dimensional images were evaluated at the independent DynaCad workstation  COMPARISON:  Mammogram from the [facility] on 12/04/2013 and earlier  FINDINGS: Breast composition: d.  Extreme fibroglandular tissue.  Background parenchymal enhancement: Moderate  Right breast: Within the upper inner quadrant of the right breast there is an enhancing oval mass which measures 2.1 x 1.6 x 1.6 cm. Mass demonstrates rapid wash-in and washout type enhancement kinetics and contains marker clip artifact. Findings are consistent with known malignancy. No other suspicious enhancement identified on the right. There are scattered foci of enhancement throughout the breast.  Left breast: No mass or abnormal enhancement. There are scattered foci of enhancement throughout the breast.  Lymph nodes: No abnormal appearing lymph nodes.  Ancillary findings: Within the right hepatic lobe there is a 3  cm lesion demonstrating typical enhancement characteristics for a hemangioma. Within the left hepatic lobe there is a second 3 cm not fully characterized on T1 weighted images.  IMPRESSION: 1. Known malignancy within the upper inner quadrant of the right breast. 2. No evidence for additional ipsilateral or contralateral disease. 3. Two 3 cm hepatic lesions. One is typical for hemangioma. The second is not characterized.  RECOMMENDATION: 1. Treatment plan for known right breast malignancy. 2. Recommend ultrasound correlation for the liver lesions.  BI-RADS CATEGORY  6: Known biopsy-proven malignancy.   Electronically Signed   By: Beth  Brown M.D.   On: 12/11/2013 13:48   Mm Digital Diagnostic Unilat R  12/04/2013   CLINICAL DATA:  Status post ultrasound-guided core biopsy of right breast mass.  EXAM: DIAGNOSTIC RIGHT MAMMOGRAM POST ULTRASOUND   BIOPSY  COMPARISON:  Previous exams  FINDINGS: Mammographic images were obtained following ultrasound guided biopsy of a mass in the 1 o'clock region of the right breast. Mammographic images show there is a coil shaped clip associated with the mass in the 1 o'clock region of the right breast.  IMPRESSION: Status post ultrasound-guided core biopsy of the right breast with pathology pending.  Final Assessment: Post Procedure Mammograms for Marker Placement   Electronically Signed   By: Lillia Mountain M.D.   On: 12/04/2013 12:09   Mm Digital Diagnostic Unilat R  12/01/2013   CLINICAL DATA:  68 year old female with possible right breast distortion/ mass on screening mammogram.  EXAM: DIGITAL DIAGNOSTIC  RIGHT MAMMOGRAM WITH CAD  ULTRASOUND RIGHT BREAST  COMPARISON:  11/13/2013 screening mammogram. Prior mammograms dating back to 10/15/2007  ACR Breast Density Category c: The breast tissue is heterogeneously dense, which may obscure small masses.  FINDINGS: CC spot compression and ML views of the right breast demonstrate a 1 cm focal asymmetry within the slightly upper inner right  breast at the junction of the middle and posterior thirds.  Mammographic images were processed with CAD.  On physical exam, no palpable abnormalities identified within the right breast.  Ultrasound is performed, showing a 0.9 x 1.1 x 1.4 cm partially circumscribed and partially obscured hypoechoic oval mass at the 1 o'clock position of the right breast 2 cm from the nipple. There may be a few internal calcifications, but not definite.  No enlarged or abnormal appearing right axillary lymph nodes are identified.  IMPRESSION: Indeterminate 0.9 x 1.1 x 1.4 cm hypoechoic mass in the upper inner right breast. Tissue sampling is recommended.  RECOMMENDATION: Ultrasound-guided right breast biopsy, which has been scheduled for 12/04/2013 and the patient informed.  I have discussed the findings and recommendations with the patient. Results were also provided in writing at the conclusion of the visit. If applicable, a reminder letter will be sent to the patient regarding the next appointment.  BI-RADS CATEGORY  4: Suspicious.   Electronically Signed   By: Hassan Rowan M.D.   On: 12/01/2013 11:33   US Breast Ltd Uni Right Inc Axilla  12/01/2013   CLINICAL DATA:  68 year old female with possible right breast distortion/ mass on screening mammogram.  EXAM: DIGITAL DIAGNOSTIC  RIGHT MAMMOGRAM WITH CAD  ULTRASOUND RIGHT BREAST  COMPARISON:  11/13/2013 screening mammogram. Prior mammograms dating back to 10/15/2007  ACR Breast Density Category c: The breast tissue is heterogeneously dense, which may obscure small masses.  FINDINGS: CC spot compression and ML views of the right breast demonstrate a 1 cm focal asymmetry within the slightly upper inner right breast at the junction of the middle and posterior thirds.  Mammographic images were processed with CAD.  On physical exam, no palpable abnormalities identified within the right breast.  Ultrasound is performed, showing a 0.9 x 1.1 x 1.4 cm partially circumscribed and partially  obscured hypoechoic oval mass at the 1 o'clock position of the right breast 2 cm from the nipple. There may be a few internal calcifications, but not definite.  No enlarged or abnormal appearing right axillary lymph nodes are identified.  IMPRESSION: Indeterminate 0.9 x 1.1 x 1.4 cm hypoechoic mass in the upper inner right breast. Tissue sampling is recommended.  RECOMMENDATION: Ultrasound-guided right breast biopsy, which has been scheduled for 12/04/2013 and the patient informed.  I have discussed the findings and recommendations with the patient. Results were also provided in writing at the conclusion of  the visit. If applicable, a reminder letter will be sent to the patient regarding the next appointment.  BI-RADS CATEGORY  4: Suspicious.   Electronically Signed   By: Jeff  Hu M.D.   On: 12/01/2013 11:33   Us Rt Breast Bx W Loc Dev 1st Lesion Img Bx Spec Us Guide  12/07/2013   ADDENDUM REPORT: 12/07/2013 12:36  ADDENDUM: Pathology revealed grade 1 to 2 invasive ductal carcinoma in the right breast. This was found to be concordant by Dr. Dina Arceo. Pathology was discussed with the patient by telephone. She reported doing well after the biopsy. Post biopsy instructions were reviewed and her questions were answered. Surgical consultation has been scheduled with Dr. Kassady Laboy at [facility] on December 14, 2013, and a bilateral breast MRI has been scheduled for December 11, 2013. She was encouraged to come to [facility] for educational materials. My number was provided for future questions and concerns.  Pathology results reported by Leigh Kuhnly RN, BSN on December 07, 2013.   Electronically Signed   By: Dina  Arceo M.D.   On: 12/07/2013 12:36   12/07/2013   CLINICAL DATA:  Suspicious right breast mass.  EXAM: ULTRASOUND GUIDED RIGHT BREAST CORE NEEDLE BIOPSY  COMPARISON:  Previous exams.  PROCEDURE: I met with the patient and we discussed the procedure of  ultrasound-guided biopsy, including benefits and alternatives. We discussed the high likelihood of a successful procedure. We discussed the risks of the procedure including infection, bleeding, tissue injury, clip migration, and inadequate sampling. Informed written consent was given. The usual time-out protocol was performed immediately prior to the procedure.  Using sterile technique and 2% Lidocaine as local anesthetic, under direct ultrasound visualization, a 12 gauge vacuum-assisteddevice was used to perform biopsy of a mass in the 1 o'clock region the right breast using an inferior to superior approach. At the conclusion of the procedure, a coil shaped tissue marker clip was deployed into the biopsy cavity. Follow-up 2-view mammogram was performed and dictated separately.  IMPRESSION: Ultrasound-guided biopsy of the right breast. No apparent complications.  Electronically Signed: By: Dina  Arceo M.D. On: 12/04/2013 12:07      ASSESSMENT AND PLAN: Breast cancer of upper-inner quadrant of right female breast Clinical T2N0 (2.1 cm) right breast cancer +/+/-. This is amenable to breast conservation surgery.  We will set her up for radiation oncology and medical oncology consultations.  I discussed a right seed localized radioactive lumpectomy with sentinel lymph node biopsy with the patient and her husband.   We reviewed the surgery, incision, risks and benefits.  We discussed the risks of bleeding, infection, damage to adjacent structures, heart or lung problems, etc.    I reviewed the timeline of surgery, recovery, and restrictions.  I discussed that she would need to hold her fish oil for 1 week prior to surgery.    30 min spent in counseling.     She is stopping her HRT as well.     Hassen Bruun L Taevyn Hausen MD Surgical Oncology, General and Endocrine Surgery [facility]      Visit Diagnoses: 1. Breast cancer, right   2. Breast cancer of upper-inner quadrant of right  female breast     Primary Care Physician: FAGAN,ROY, MD  Other care team Beth Brown, MD  

## 2013-12-23 NOTE — Transfer of Care (Signed)
Immediate Anesthesia Transfer of Care Note  Patient: Kaitlyn Porter  Procedure(s) Performed: Procedure(s): RADIOACTIVE SEED GUIDED PARTIAL MASTECTOMY WITH AXILLARY SENTINEL LYMPH NODE BIOPSY (Right)  Patient Location: PACU  Anesthesia Type:GA combined with regional for post-op pain  Level of Consciousness: sedated  Airway & Oxygen Therapy: Patient Spontanous Breathing and Patient connected to face mask oxygen  Post-op Assessment: Report given to PACU RN and Post -op Vital signs reviewed and stable  Post vital signs: Reviewed and stable  Complications: No apparent anesthesia complications

## 2013-12-23 NOTE — Op Note (Signed)
Right Breast Lumpectomy with Sentinel Node Mapping and Biopsy Procedure Note  Indications: This patient presents with history of right breast cancer with clinically negative axillary lymph node exam.  Pre-operative Diagnosis: right breast cancer  Post-operative Diagnosis: right breast cancer  Surgeon: Stark Klein   Assistants: n/a  Anesthesia: General LMA anesthesia and Local anesthesia 1% plain lidocaine, 0.25.% bupivacaine, with epinephrine  ASA Class: 2  Procedure Details  The patient was seen in the Holding Room. The risks, benefits, complications, treatment options, and expected outcomes were discussed with the patient. The possibilities of reaction to medication, pulmonary aspiration, bleeding, infection, the need for additional procedures, failure to diagnose a condition, and creating a complication requiring transfusion or operation were discussed with the patient. The patient concurred with the proposed plan, giving informed consent.  The site of surgery properly noted/marked. The patient was taken to Operating Room # 2, identified as Zara Council Muradyan and the procedure verified as Breast Lumpectomy and Sentinel Node Biopsy. A Time Out was held and the above information confirmed.   After induction of anesthesia, the right arm, breast, and chest were prepped and draped in standard fashion.  The lumpectomy was performed by creating an oblique incision over the upper inner quadrant of the breast over the previously placed localization seed.  Dissection was carried down to the pectoral fascia. Hemostasis was achieved with cautery.  The neoprobe was used to confirm the seed was in the specimen and that there was no additional signal coming from the breast.   The specimen was inked.   Specimen radiography confirmed inclusion of the mammographic lesion, the seed, and the biopsy clip.  Metal clips were placed at the cavity borders.  A deep layer of 2-0 vicryl sutures were placed to close the  cavity.  The wound was irrigated and closed with a 3-0 Vicryl deep dermal interrupted and a 4-0 Monocryl subcuticular closure in layers.    Using a hand-held gamma probe, axillary sentinel nodes were identified transcutaneously.  An oblique incision was created below the axillary hairline.  Dissection was carried through the clavipectoral fascia.  4 level 2 axillary sentinel nodes were removed and submitted to pathology.  The axillary incision was closed with 3-0 vicryl deep dermal sutures and 4-0 monocryl running subcuticular closure in layers.      Sterile dressings were applied. At the end of the operation, all sponge, instrument, and needle counts were correct.  Findings: grossly clear surgical margins and 4 sentinel nodes.  #1 cps 1050, #2 cps 2399, #3 cps 830, #4 cps 430.  Background count 20.    Estimated Blood Loss:  Minimal                Specimens: right breast lumpectomy and 4 sentinel nodes                Complications:  None; patient tolerated the procedure well.         Disposition: PACU - hemodynamically stable.         Condition: stable

## 2013-12-24 ENCOUNTER — Telehealth (INDEPENDENT_AMBULATORY_CARE_PROVIDER_SITE_OTHER): Payer: Self-pay | Admitting: General Surgery

## 2013-12-24 NOTE — Telephone Encounter (Signed)
Discussed pathology with pt.  No additional surgery needed.  Pt has oncology and radiation scheduled.    We will get her a follow up next week.

## 2013-12-24 NOTE — Telephone Encounter (Signed)
No notes in encounter. 

## 2014-01-11 ENCOUNTER — Encounter: Payer: Self-pay | Admitting: Hematology and Oncology

## 2014-01-11 ENCOUNTER — Ambulatory Visit: Payer: Medicare PPO

## 2014-01-11 ENCOUNTER — Ambulatory Visit (HOSPITAL_BASED_OUTPATIENT_CLINIC_OR_DEPARTMENT_OTHER): Payer: Medicare PPO | Admitting: Hematology and Oncology

## 2014-01-11 VITALS — BP 153/81 | HR 98 | Temp 98.0°F | Resp 17 | Ht 63.0 in | Wt 126.7 lb

## 2014-01-11 DIAGNOSIS — C50211 Malignant neoplasm of upper-inner quadrant of right female breast: Secondary | ICD-10-CM

## 2014-01-11 DIAGNOSIS — C50219 Malignant neoplasm of upper-inner quadrant of unspecified female breast: Secondary | ICD-10-CM

## 2014-01-11 NOTE — Assessment & Plan Note (Signed)
1. 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 Discussed with the patient the details of pathology including the type of breast cancer,the clinical staging, the significance of ER, PR and HER-2/neu receptors and the implications for treatment. After reviewing the pathology in detail, we proceeded to discuss the different treatment options between radiation, chemotherapy, antiestrogen therapies.  3. I discussed with them that we need to send the tissue for Oncotype DX. Described the patient and the Oncotype DX is the prognostic score that is based on analysis of 21 genes and comes up with that recurrence score. The regular school attendance fall into low risk of intermediate risk or high risk. It is well-known that high risk patients benefit from systemic chemotherapy. 10 low risk patients to not benefit from chemotherapy. Intermediate risk is a gray zone that is unclear regarding the true benefit of chemotherapy but certainly some patients may be offered treatment based on other risk factors.  4. I discussed the risks and benefits of aromatase inhibitors and tamoxifen. Because she has osteoporosis, I recommended tamoxifen instead of aromatase inhibitors. We discussed the risks and benefits of tamoxifen. These include but not limited to insomnia, hot flashes, mood changes, vaginal dryness, and weight gain. Although rare, serious side effects including endometrial cancer, risk of blood clots were also discussed. We strongly believe that the benefits far outweigh the risks. Patient understands these risks and consented to starting treatment. Planned treatment duration is 5-10 years.  5. Return to clinic in 2-3 weeks to discuss the results of Oncotype DX. Patient understands the sequence of events would be chemotherapy followed by radiation followed by antiestrogen therapy. Patient has appointment next Wednesday to see radiation  oncology. She plans to [facility] in November first week. We will work around the schedule if needed.

## 2014-01-11 NOTE — Progress Notes (Signed)
Checked in new patient with no financial issues prior to seeing the dr. She has appt card and has her [facility] packet. She has not been out of the country.

## 2014-01-11 NOTE — Progress Notes (Signed)
[facility] NOTE  Patient Care Team: Asencion Noble, MD as PCP - General (Internal Medicine)  CHIEF COMPLAINTS/PURPOSE OF CONSULTATION:  Newly diagnosed breast cancer  HISTORY OF PRESENTING ILLNESS:  Kaitlyn Porter 68 y.o. Caucasian female is here because of recent diagnosis of right breast cancer. The though she was found to have some calcifications. She was followed up every 6 months with mammograms. These calcifications did not change and she had a routine annual mammogram in August 2015. That revealed abnormalities worrisome for malignancy. She underwent biopsy followed by an MRI of the breasts that revealed a 2.1 cm oval mass in the right upper inner quadrant. She underwent lumpectomy on 12/23/2013. That revealed a 1.4 cm right breast malignancy that was ER/PR positive HER-2 negative. There was lymphovascular invasion noted. For sentinel lymph nodes were negative. She has recovered very well from surgery and is here today to discuss adjuvant therapy options. She is accompanied today by her husband. The history physical intake form in her of   I reviewed her records extensively and collaborated the history with the patient.  SUMMARY OF ONCOLOGIC HISTORY:   Breast cancer of upper-inner quadrant of right female breast   12/11/2013 Breast MRI Right breast upper inner quadrant 2.1 x 1.6 x 1.6 cm oval mass no abnormal lymph nodes   12/14/2013 Initial Diagnosis Breast cancer of upper-inner quadrant of right female breast   12/23/2013 Surgery 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, 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, lymphovascular invasion is found, for sentinel lymph nodes negative ER 100% PR 100% HER-2 negative ratio 1.1, Ki-67 14%    In terms of breast cancer risk profile:  She menarched at early age of 1 and went to menopause at age 76  She had 2 pregnancy, her first child was born at age 45  She did not received birth control pills.  She was  exposed to hormone  replacement therapy with Premarin.  She has no family history of Breast/GYN/GI cancer  MEDICAL HISTORY:  Past Medical History  Diagnosis Date  . Osteoporosis   . Fibroids 12/2008    history of  . Skin cancer     SURGICAL HISTORY: Past Surgical History  Procedure Laterality Date  . Tonsillectomy    . Appendectomy    . Vaginal delivery      times 2  . Colonoscopy  10 years ago    dr Laural Golden  . Colonoscopy  12/07/2010    Procedure: COLONOSCOPY;  Surgeon: Rogene Houston, MD;  [facility];  Service: Endoscopy;  Laterality: N/A;  . Tubal ligation  1976  . Toe arthroscopy  2005    rt great toe spurs    SOCIAL HISTORY: History   Social History  . Marital Status: Married    Spouse Name: N/A    Number of Children: 2  . Years of Education: N/A   Occupational History  . retired    Social History Main Topics  . Smoking status: Former Smoker -- 0.50 packs/day for 5 years    Quit date: 11/22/1975  . Smokeless tobacco: Never Used  . Alcohol Use: No  . Drug Use: No  . Sexual Activity: Yes    Partners: Male    Birth Control/ Protection: Post-menopausal, Surgical     Comment: BTSP   Other Topics Concern  . Not on file   Social History Narrative  . No narrative on file    FAMILY HISTORY: Family History  Problem Relation  Age of Onset  . Diabetes Brother   . Cancer Maternal Grandfather   . Heart failure Mother   . Aneurysm Mother     abdominal    ALLERGIES:  is allergic to latex.  MEDICATIONS:  Current Outpatient Prescriptions  Medication Sig Dispense Refill  . fish oil-omega-3 fatty acids 1000 MG capsule Take 1,000 g by mouth daily.        . Multiple Vitamin (MULTIVITAMIN) capsule Take 1 capsule by mouth daily.        . Vitamin D, Ergocalciferol, (DRISDOL) 50000 UNITS CAPS Take 50,000 Units by mouth every 30 (thirty) days.       No current facility-administered medications for this visit.    REVIEW OF SYSTEMS:   Constitutional: Denies fevers,  chills or abnormal night sweats Eyes: Denies blurriness of vision, double vision or watery eyes Ears, nose, mouth, throat, and face: Denies mucositis or sore throat Respiratory: Denies cough, dyspnea or wheezes Cardiovascular: Denies palpitation, chest discomfort or lower extremity swelling Gastrointestinal:  Denies nausea, heartburn or change in bowel habits Skin: Denies abnormal skin rashes Lymphatics: Denies new lymphadenopathy or easy bruising Neurological:Denies numbness, tingling or new weaknesses Behavioral/Psych: Mood is stable, no new changes  Breast: Right breast soreness or recent surgery All other systems were reviewed with the patient and are negative.  PHYSICAL EXAMINATION: ECOG PERFORMANCE STATUS: 1 - Symptomatic but completely ambulatory  Filed Vitals:   01/11/14 1252  BP: 153/81  Pulse: 98  Temp: 98 F (36.7 C)  Resp: 17   Filed Weights   01/11/14 1252  Weight: 126 lb 11.2 oz (57.471 kg)    GENERAL:alert, no distress and comfortable SKIN: skin color, texture, turgor are normal, no rashes or significant lesions EYES: normal, conjunctiva are pink and non-injected, sclera clear OROPHARYNX:no exudate, no erythema and lips, buccal mucosa, and tongue normal  NECK: supple, thyroid normal size, non-tender, without nodularity LYMPH:  no palpable lymphadenopathy in the cervical, axillary or inguinal LUNGS: clear to auscultation and percussion with normal breathing effort HEART: regular rate & rhythm and no murmurs and no lower extremity edema ABDOMEN:abdomen soft, non-tender and normal bowel sounds Musculoskeletal:no cyanosis of digits and no clubbing  PSYCH: alert & oriented x 3 with fluent speech NEURO: no focal motor/sensory deficits  LABORATORY DATA:  I have reviewed the data as listed Lab Results  Component Value Date   WBC 5.8 12/22/2013   HGB 14.7 12/22/2013   HCT 42.0 12/22/2013   MCV 99.8 12/22/2013   PLT 233 12/22/2013   Lab Results  Component Value Date    NA 142 12/22/2013   K 3.8 12/22/2013   CL 103 12/22/2013   CO2 27 12/22/2013    RADIOGRAPHIC STUDIES: I have personally reviewed the radiological reports and agreed with the findings in the report.  ASSESSMENT AND PLAN:  Breast cancer of upper-inner quadrant of right female breast 1. 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 Discussed with the patient the details of pathology including the type of breast cancer,the clinical staging, the significance of ER, PR and HER-2/neu receptors and the implications for treatment. After reviewing the pathology in detail, we proceeded to discuss the different treatment options between radiation, chemotherapy, antiestrogen therapies.  3. I discussed with them that we need to send the tissue for Oncotype DX. Described the patient and the Oncotype DX is the prognostic score that is based on analysis  of 21 genes and comes up with that recurrence score. The regular school attendance fall into low risk of intermediate risk or high risk. It is well-known that high risk patients benefit from systemic chemotherapy. 10 low risk patients to not benefit from chemotherapy. Intermediate risk is a gray zone that is unclear regarding the true benefit of chemotherapy but certainly some patients may be offered treatment based on other risk factors.  4. I discussed the risks and benefits of aromatase inhibitors and tamoxifen. Because she has osteoporosis, I recommended tamoxifen instead of aromatase inhibitors. We discussed the risks and benefits of tamoxifen. These include but not limited to insomnia, hot flashes, mood changes, vaginal dryness, and weight gain. Although rare, serious side effects including endometrial cancer, risk of blood clots were also discussed. We strongly believe that the benefits far outweigh the risks. Patient understands these risks and consented to starting treatment. Planned  treatment duration is 5-10 years.  5. Return to clinic in 2-3 weeks to discuss the results of Oncotype DX. Patient understands the sequence of events would be chemotherapy followed by radiation followed by antiestrogen therapy. Patient has appointment next Wednesday to see radiation oncology. She plans to [facility] in November first week. We will work around the schedule if needed.   All questions were answered. The patient knows to call the clinic with any problems, questions or concerns. I spent 40 minutes counseling the patient face to face. The total time spent in the appointment was 60 minutes and more than 50% was on counseling.     Rulon Eisenmenger, MD 01/11/2014 2:17 PM

## 2014-01-11 NOTE — Progress Notes (Signed)
Location of Breast Cancer:Right Breast 1.4 cm mass in right upper-inner quadrant lobe, grade 1    Breast cancer of upper-inner quadrant of right female breast  12/11/2013  Breast MRI  Right breast upper inner quadrant 2.1 x 1.6 x 1.6 cm oval mass no abnormal lymph nodes  12/14/2013  Initial Diagnosis  Breast cancer of upper-inner quadrant of right female breast   Histology per Pathology Report:- INVASIVE DUCTAL CARCINOMA, GRADE I/III, SPANNING 1.4 CM. - DUCTAL CARCINOMA IN SITU, LOW GRADE. - LYMPHOVASCULAR INVASION IS IDENTIFIED. - INVASIVE CARCINOMA IS FOCALLY 0.1 CM TO THE POSTERIOR MARGIN. - SEE ONCOLOGY TABLE BELOW. 2. Lymph node, sentinel, biopsy, Right axillary #1 - THERE IS NO EVIDENCE OF CARCINOMA IN 1 OF 1 LYMPH NODE (0/1). 3. Lymph node, sentinel, biopsy, Right axillary #2 1 of 3 FINAL for Torelli, [place] C ([id number]) Diagnosis(continued) - THERE IS NO EVIDENCE OF CARCINOMA IN 1 OF 1 LYMPH NODE (0/1). 4. Lymph node, sentinel, biopsy, Right axillary #3 - THERE IS NO EVIDENCE OF CARCINOMA IN 1 OF 1 LYMPH NODE (0/1). 5. Lymph node, sentinel, biopsy, Right axillary #4 - THERE IS NO EVIDENCE OF CARCINOMA IN 1 OF 1 LYMPH NODE (0/1). 12/23/2013                                                             :12/04/2013:Diagnosis Breast, right, needle core biopsy, mass, 1 o'clock - INVASIVE DUCTAL CARCINOMA.  Receptor Status: ER(+), PR (+), Her2-neu (-)  Did patient present with symptoms (if so, please note symptoms) or was this found on screening mammography?:Found during routine annual mammography August 2015.  Past/Anticipated interventions by surgeon, if any:12/23/2013 Panel 1: Right RADIOACTIVE SEED GUIDED PARTIAL MASTECTOMY WITH AXILLARY SENTINEL LYMPH NODE BIOPSY with Stark Klein, MD   Past/Anticipated interventions by medical oncology, if OZD:GUYQIHKV to be sent off and then follow up with Dr.Gudena to discuss plan of care for chemotherapy, radiation and anti-estrogen  therapy.  Lymphedema issues, if any: No lymphedema but patient does still have fluid in the breast draining 50 cc's daily from drain tube.  Pain issues, if any: No.  SAFETY ISSUES:  Prior radiation? No  Pacemaker/ICD?No  Possible current pregnancy?No  Is the patient on methotrexate? No  Current Complaints / other details: In terms of breast cancer risk profile:  She menarched at early age of 35 and went to menopause at age 26  She had 2 pregnancy, her first child was born at age 63  She did not received birth control pills.  She was exposed to hormone replacement therapy with Premarin for 20 years. She has no family history of Breast/GYN/GI cancer  Married with 2 sons.retired from [employer]. Allergies:Latex     Arlyss Repress, RN 01/11/2014,3:04 PM

## 2014-01-11 NOTE — Progress Notes (Signed)
New patient information form - copy to Dr. Lindi Adie.  Original to scan.   MD created office note - copy ot pt.  Original to scan.

## 2014-01-13 ENCOUNTER — Ambulatory Visit
Admission: RE | Admit: 2014-01-13 | Discharge: 2014-01-13 | Disposition: A | Discharge status: Home / Self Care | Payer: Medicare PPO | Source: Ambulatory Visit | Attending: Radiation Oncology | Admitting: Radiation Oncology

## 2014-01-13 ENCOUNTER — Encounter: Payer: Self-pay | Admitting: *Deleted

## 2014-01-13 ENCOUNTER — Encounter: Payer: Self-pay | Admitting: Radiation Oncology

## 2014-01-13 VITALS — BP 150/75 | HR 86 | Temp 98.0°F | Wt 128.8 lb

## 2014-01-13 DIAGNOSIS — Z87891 Personal history of nicotine dependence: Secondary | ICD-10-CM | POA: Insufficient documentation

## 2014-01-13 DIAGNOSIS — Z510 Encounter for antineoplastic radiation therapy: Secondary | ICD-10-CM | POA: Diagnosis present

## 2014-01-13 DIAGNOSIS — C50211 Malignant neoplasm of upper-inner quadrant of right female breast: Secondary | ICD-10-CM

## 2014-01-13 DIAGNOSIS — C50219 Malignant neoplasm of upper-inner quadrant of unspecified female breast: Secondary | ICD-10-CM | POA: Insufficient documentation

## 2014-01-13 DIAGNOSIS — M810 Age-related osteoporosis without current pathological fracture: Secondary | ICD-10-CM | POA: Insufficient documentation

## 2014-01-13 MED ORDER — RADIAPLEXRX EX GEL
Freq: Once | CUTANEOUS | Status: AC
  Administered 2014-01-13: 17:00:00 via TOPICAL

## 2014-01-13 NOTE — Progress Notes (Signed)
Received order for oncotype dx testing. Requisition sent to pathology.

## 2014-01-13 NOTE — Progress Notes (Signed)
Please see the Nurse Progress Note in the MD Initial Consult Encounter for this patient. 

## 2014-01-14 NOTE — Progress Notes (Signed)
Radiation Oncology         [id number]) [phone] ________________________________  Initial outpatient Consultation - Date: 01/13/2014   Name: Kaitlyn Porter MRN: [id number]   DOB: 1946/03/28  REFERRING PHYSICIAN: Stark Klein, MD  DIAGNOSIS:    ICD-9-CM ICD-10-CM   1. Breast cancer of upper-inner quadrant of right female breast 174.2 C50.211 hyaluronate sodium (RADIAPLEXRX) gel     hyaluronate sodium (RADIAPLEXRX) gel    STAGE: Breast cancer of upper-inner quadrant of right female breast   Primary site: Breast (Right)   Staging method: AJCC 7th Edition   Pathologic: Stage IA (T1c, N0, cM0) signed by Rulon Eisenmenger, MD on 01/11/2014  2:09 PM   Summary: Stage IA (T1c, N0, cM0)  HISTORY OF PRESENT ILLNESS::Kaitlyn Porter is a 68 y.o. female  Was found to have an abnormal mammogram in August of this year. A biopsy showed invasive cancer. MRI was negative for other findings. She underwent a lumpectomy on 9/2 that showed a 1.4 cm lesion with negative margins and negative lymph nodes. ER/PR + HER2 negative. She hs had some drainage form her axilla since surgery but otherwise is healing well. An oncotype has been sent to determine her need for chemotherapy. She was referred by Dr. Barry Dienes for discussion of radiation in the management of her disease. She is accompanied by her husband. She had menarche at 10 and underwent menopause at 5. Was on Premarin for 20 years but has stopped that. She is GX P2 with her first birth at 42.   PREVIOUS RADIATION THERAPY: No  PAST MEDICAL HISTORY:  has a past medical history of Osteoporosis; Fibroids (12/2008); and Skin cancer.    PAST SURGICAL HISTORY: Past Surgical History  Procedure Laterality Date  . Tonsillectomy    . Appendectomy    . Vaginal delivery      times 2  . Colonoscopy  10 years ago    dr Laural Golden  . Colonoscopy  12/07/2010    Procedure: COLONOSCOPY;  Surgeon: Rogene Houston, MD;  Location: [facility];  Service: Endoscopy;  Laterality: N/A;  .  Tubal ligation  1976  . Toe arthroscopy  2005    rt great toe spurs    FAMILY HISTORY:  Family History  Problem Relation Age of Onset  . Diabetes Brother   . Cancer Maternal Grandfather   . Heart failure Mother   . Aneurysm Mother     abdominal    SOCIAL HISTORY:  History  Substance Use Topics  . Smoking status: Former Smoker -- 0.50 packs/day for 5 years    Quit date: 11/21/1973  . Smokeless tobacco: Never Used  . Alcohol Use: No    ALLERGIES: Latex  MEDICATIONS:  Current Outpatient Prescriptions  Medication Sig Dispense Refill  . fish oil-omega-3 fatty acids 1000 MG capsule Take 1,000 g by mouth daily.        . Multiple Vitamin (MULTIVITAMIN) capsule Take 1 capsule by mouth daily.        . Vitamin D, Ergocalciferol, (DRISDOL) 50000 UNITS CAPS Take 50,000 Units by mouth every 30 (thirty) days.       No current facility-administered medications for this encounter.    REVIEW OF SYSTEMS:  A 15 point review of systems is documented in the electronic medical record. This was obtained by the nursing staff. However, I reviewed this with the patient to discuss relevant findings and make appropriate changes.  Pertinent items are noted in HPI.  PHYSICAL EXAM:  Filed Vitals:  01/13/14 1506  BP: 150/75  Pulse: 86  Temp: 98 F (36.7 C)  .128 lb 12.8 oz (58.423 kg). Pleasant thin female. No distress. Drain in right axilla. Surgical incision healing well. No abnormalities of the left breast. No palpable adenopathy.   LABORATORY DATA:  Lab Results  Component Value Date   WBC 5.8 12/22/2013   HGB 14.7 12/22/2013   HCT 42.0 12/22/2013   MCV 99.8 12/22/2013   PLT 233 12/22/2013   Lab Results  Component Value Date   NA 142 12/22/2013   K 3.8 12/22/2013   CL 103 12/22/2013   CO2 27 12/22/2013   No results found for this basename: ALT, AST, GGT, ALKPHOS, BILITOT     RADIOGRAPHY: US Abdomen Complete  12/21/2013   CLINICAL DATA:  Recent diagnosis of breast cancer.  EXAM: ULTRASOUND ABDOMEN  COMPLETE  COMPARISON:  Breast MRI 06/13/2013.  FINDINGS: Gallbladder:  No gallstones or wall thickening visualized. No sonographic Murphy sign noted.  Common bile duct:  Diameter: Normal at 4.2 mm  Liver:  There are 4 well-circumscribed hyperechoic lesions within the liver. In addition to hyperechogenicity, the lesions demonstrate through transmission. Two within the left hepatic lobe and two in the right hepatic lobe. The largest measures 4 cm in the left lower hepatic lobe. Largest right hepatic lobe measures 3 cm. These lesions have imaging characteristics consistent with benign hemangiomas.  IVC:  No abnormality visualized.  Pancreas:  Visualized portion unremarkable.  Spleen:  Size and appearance within normal limits.  Right Kidney:  Length: 9.7 cm . Anechoic cyst in the upper pole with through transmission.  Left Kidney:  Length: 10.2 cm. Echogenicity within normal limits. No mass or hydronephrosis visualized.  Abdominal aorta:  No aneurysm visualized.  Other findings:  Ascites  IMPRESSION: 1. Four hyperechoic lesions in the liver have imaging characteristics consistent with benign hemangiomas. 2. Simple cyst of the right kidney.   Electronically Signed   By: Suzy Bouchard M.D.   On: 12/21/2013 10:24   Nm Sentinel Node Inj-no Rpt (breast)  12/23/2013   CLINICAL DATA: right breast cancer   Sulfur colloid was injected intradermally by the nuclear medicine  technologist for breast cancer sentinel node localization.    Mm Breast Surgical Specimen  12/23/2013   CLINICAL DATA:  Patient status post right breast lumpectomy.  EXAM: SPECIMEN RADIOGRAPH OF THE RIGHT BREAST  COMPARISON:  Previous exam(s)  FINDINGS: Status post excision of the right breast. The radioactive seed and biopsy marker clip are present, completely intact, and are marked for pathology.  IMPRESSION: Specimen radiograph of the right breast.   Electronically Signed   By: Lovey Newcomer M.D.   On: 12/23/2013 12:18   Mm Rt Plc Breast Loc Dev    1st Lesion  Inc Mammo Guide  12/22/2013   CLINICAL DATA:  Right breast carcinoma. Patient for radioactive seed localization.  EXAM: MAMMOGRAPHIC GUIDED RADIOACTIVE SEED LOCALIZATION OF THE RIGHT BREAST  COMPARISON:  Previous exam(s)  FINDINGS: Patient presents for radioactive seed localization prior to surgical excision. I met with the patient and we discussed the procedure of seed localization including benefits and alternatives. We discussed the high likelihood of a successful procedure. We discussed the risks of the procedure including infection, bleeding, tissue injury and further surgery. We discussed the low dose of radioactivity involved in the procedure. Informed, written consent was given.  The usual time-out protocol was performed immediately prior to the procedure.  Using mammographic guidance, sterile technique, 2% lidocaine and an  I-125 radioactive seed, the coil shaped biopsy clip was localized using a CC approach. The follow-up mammogram images confirm the seed in the expected location and are marked for Dr. Barry Dienes.  Follow-up survey of the patient confirms presence of radioactive seed.  Order number of I-125 seed:  [id number].  Dose of I-125 seed:  1.71m.  The patient tolerated the procedure well and was [facility]. She was given instructions regarding seed removal.  IMPRESSION: Radioactive seed localization right breast. No apparent complications.   Electronically Signed   By: DLajean ManesM.D.   On: 12/22/2013 13:54      IMPRESSION: T1cN0 Invasive Ductal Carcinoma of the right breast  PLAN:I spoke to the patient today regarding her diagnosis and options for treatment. We discussed the equivalence in terms of survival and local failure between mastectomy and breast conservation. We discussed the role of radiation in decreasing local failures in patients who undergo lumpectomy. We discussed the process of simulation and the placement tattoos. We discussed 4 weeks of  treatment as an outpatient. We discussed the possibility of asymptomatic lung damage. We discussed the low likelihood of secondary malignancies. We discussed the possible side effects including but not limited to skin redness, fatigue, permanent skin darkening, and breast swelling. We discussed the process of simulation and the placement of tattoos. I will see her back after her Oncotype score. I did clarify with her that if she needed chemotherapy this would be performed prior to radiation.   She must have her drain removed prior to beginning treatment and is seeing Dr. BBarry Dieneson 9/30 for evaluation of that.   [facility] [place] [facility] [place] and I think it would be fine for her to undergo treatment there so we have set up a simulation for 10/23 with plans to [facility] She will also be meeting back with medical oncology in regards to her oncotype test.    I spent 40 minutes face to face with the patient and more than 50% of that time was spent in counseling and/or coordination of care.   ------------------------------------------------  SThea Silversmith MD

## 2014-01-20 ENCOUNTER — Other Ambulatory Visit: Payer: Self-pay

## 2014-01-20 ENCOUNTER — Encounter (HOSPITAL_COMMUNITY): Payer: Self-pay

## 2014-01-21 ENCOUNTER — Telehealth: Payer: Self-pay | Admitting: Hematology and Oncology

## 2014-01-21 NOTE — Telephone Encounter (Signed)
s.w. pt and advised on 10.2 appt....pt ok and aware

## 2014-01-22 ENCOUNTER — Telehealth: Payer: Self-pay | Admitting: Hematology and Oncology

## 2014-01-22 ENCOUNTER — Ambulatory Visit (HOSPITAL_BASED_OUTPATIENT_CLINIC_OR_DEPARTMENT_OTHER): Payer: Medicare PPO | Admitting: Hematology and Oncology

## 2014-01-22 VITALS — BP 145/83 | HR 84 | Temp 98.1°F | Resp 18 | Ht 63.0 in | Wt 127.8 lb

## 2014-01-22 DIAGNOSIS — Z23 Encounter for immunization: Secondary | ICD-10-CM

## 2014-01-22 DIAGNOSIS — Z170 Estrogen receptor positive status [ER+]: Secondary | ICD-10-CM

## 2014-01-22 DIAGNOSIS — M810 Age-related osteoporosis without current pathological fracture: Secondary | ICD-10-CM

## 2014-01-22 DIAGNOSIS — C50211 Malignant neoplasm of upper-inner quadrant of right female breast: Secondary | ICD-10-CM

## 2014-01-22 MED ORDER — INFLUENZA VAC SPLIT QUAD 0.5 ML IM SUSY
0.5000 mL | PREFILLED_SYRINGE | Freq: Once | INTRAMUSCULAR | Status: AC
  Administered 2014-01-22: 0.5 mL via INTRAMUSCULAR
  Filled 2014-01-22: qty 0.5

## 2014-01-22 MED ORDER — TAMOXIFEN CITRATE 20 MG PO TABS: 20.0000 mg | ORAL_TABLET | Freq: Every day | ORAL | Status: DC

## 2014-01-22 NOTE — Telephone Encounter (Signed)
per pof to sch pt appt-gave pt copy of sch °

## 2014-01-22 NOTE — Progress Notes (Signed)
Patient Care Team: Asencion Noble, MD as PCP - General (Internal Medicine)  DIAGNOSIS: Breast cancer of upper-inner quadrant of right female breast   Primary site: Breast (Right)   Staging method: AJCC 7th Edition   Pathologic: Stage IA (T1c, N0, cM0) signed by Rulon Eisenmenger, MD on 01/11/2014  2:09 PM   Summary: Stage IA (T1c, N0, cM0)   SUMMARY OF ONCOLOGIC HISTORY:   Breast cancer of upper-inner quadrant of right female breast   12/11/2013 Breast MRI Right breast upper inner quadrant 2.1 x 1.6 x 1.6 cm oval mass no abnormal lymph nodes   12/14/2013 Initial Diagnosis Breast cancer of upper-inner quadrant of right female breast   12/23/2013 Surgery 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 She had a stage IA breast cancer. She is here today to discuss the results of the Oncotype DX testing. She does not have any new complaints or concerns. She plans to go to [employer] with her family and is very excited about that.  REVIEW OF SYSTEMS:   Constitutional: Denies fevers, chills or abnormal weight loss Eyes: Denies blurriness of vision Ears, nose, mouth, throat, and face: Denies mucositis or sore throat Respiratory: Denies cough, dyspnea or wheezes Cardiovascular: Denies palpitation, chest discomfort or lower extremity swelling Gastrointestinal:  Denies nausea, heartburn or change in bowel habits Skin: Denies abnormal skin rashes Lymphatics: Denies new lymphadenopathy or easy bruising Neurological:Denies numbness, tingling or new weaknesses Behavioral/Psych: Mood is stable, no new changes  Breast:  denies  any pain or lumps or nodules in either breasts All other systems were reviewed with the patient and are negative.  I have reviewed the past medical history, past surgical history, social history and family history with the patient and they are unchanged from previous note.  ALLERGIES:  is allergic to latex.  MEDICATIONS:  Current Outpatient Prescriptions  Medication Sig Dispense Refill  . fish oil-omega-3 fatty acids 1000 MG capsule Take 1,000 g by mouth daily.        . Multiple Vitamin (MULTIVITAMIN) capsule Take 1 capsule by mouth daily.        . tamoxifen (NOLVADEX) 20 MG tablet Take 1 tablet (20 mg total) by mouth daily.  90 tablet  3  . Vitamin D, Ergocalciferol, (DRISDOL) 50000 UNITS CAPS Take 50,000 Units by mouth every 30 (thirty) days.       No current facility-administered medications for this visit.    PHYSICAL EXAMINATION: ECOG PERFORMANCE STATUS: 0 - Asymptomatic  Filed Vitals:   01/22/14 0839  BP: 145/83  Pulse: 84  Temp: 98.1 F (36.7 C)  Resp: 18   Filed Weights   01/22/14 0839  Weight: 127 lb 12.8 oz (57.97 kg)    GENERAL:alert, no distress and comfortable SKIN: skin color, texture, turgor are normal, no rashes or significant lesions EYES: normal, Conjunctiva are pink and non-injected, sclera clear OROPHARYNX:no exudate, no erythema and lips, buccal mucosa, and tongue normal  NECK: supple, thyroid normal size, non-tender, without nodularity LYMPH:  no palpable lymphadenopathy in the cervical, axillary or inguinal LUNGS: clear to auscultation and percussion with normal breathing effort HEART: regular rate &  rhythm and no murmurs and no lower extremity edema ABDOMEN:abdomen soft, non-tender and normal bowel sounds Musculoskeletal:no cyanosis of digits and no clubbing  NEURO: alert & oriented x 3 with fluent speech, no focal motor/sensory deficits  LABORATORY DATA:  I have reviewed the data as listed   Chemistry      Component Value Date/Time   NA 142  12/22/2013 1430   K 3.8 12/22/2013 1430   CL 103 12/22/2013 1430   CO2 27 12/22/2013 1430   BUN 11 12/22/2013 1430   CREATININE 0.72 12/22/2013 1430      Component Value Date/Time   CALCIUM 9.0 12/22/2013 1430       Lab Results  Component Value Date   WBC 5.8 12/22/2013   HGB 14.7 12/22/2013   HCT 42.0 12/22/2013   MCV 99.8 12/22/2013   PLT 233 12/22/2013   NEUTROABS 3.5 12/22/2013     RADIOGRAPHIC STUDIES: I have personally reviewed the radiology reports and agreed with their findings. No results found.   ASSESSMENT & PLAN:  Breast cancer of upper-inner quadrant of right female breast 1. 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 Patient was extremely happy here and this resolved.  She has an appointment to see radiation oncology and we'll plan to get started on radiation November 9. Once he finishes radiation therapy she will start on antiestrogen therapy.  Tamoxifen counseling:We discussed the risks and benefits of tamoxifen. These include but not limited to insomnia, hot flashes, mood changes, vaginal dryness, and weight gain. Although rare, serious side effects including endometrial cancer, risk of blood clots were also discussed. We strongly believe that the benefits far outweigh the risks. Patient understands these risks and consented to starting treatment. Planned treatment duration is 5 years. 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  Osteoporosis: I asked her to discuss with her gynecologist who had previously prescribed RECLAST whether she needs to go back on the medication.    No orders of the defined  types were placed in this encounter.   The patient has a good understanding of the overall plan. she agrees with it. She will call with any problems that may develop before her next visit here.  I spent 20 minutes counseling the patient face to face. The total time spent in the appointment was 25 minutes and more than 50% was on counseling and review of test results    Rulon Eisenmenger, MD 01/22/2014 9:08 AM

## 2014-01-22 NOTE — Assessment & Plan Note (Signed)
1. 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 Patient was extremely happy here and this resolved.  She has an appointment to see radiation oncology and we'll plan to get started on radiation November 9. Once he finishes radiation therapy she will start on antiestrogen therapy.  Tamoxifen counseling:We discussed the risks and benefits of tamoxifen. These include but not limited to insomnia, hot flashes, mood changes, vaginal dryness, and weight gain. Although rare, serious side effects including endometrial cancer, risk of blood clots were also discussed. We strongly believe that the benefits far outweigh the risks. Patient understands these risks and consented to starting treatment. Planned treatment duration is 5 years. 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  Osteoporosis: I asked her to discuss with her gynecologist who had previously prescribed RECLAST whether she needs to go back on the medication.

## 2014-01-25 ENCOUNTER — Encounter: Payer: Self-pay | Admitting: *Deleted

## 2014-01-25 NOTE — Progress Notes (Signed)
Received Oncotype Dx results of 15.  Placed a copy in Dr. Geralyn Flash box and took a copy to HIM to scan.

## 2014-02-04 ENCOUNTER — Other Ambulatory Visit: Payer: Self-pay | Admitting: Dermatology

## 2014-02-05 ENCOUNTER — Other Ambulatory Visit: Payer: Self-pay

## 2014-02-22 ENCOUNTER — Encounter: Payer: Self-pay | Admitting: Radiation Oncology

## 2014-04-13 ENCOUNTER — Other Ambulatory Visit: Payer: Self-pay | Admitting: Dermatology

## 2014-05-03 ENCOUNTER — Ambulatory Visit (HOSPITAL_BASED_OUTPATIENT_CLINIC_OR_DEPARTMENT_OTHER): Payer: Medicare PPO | Admitting: Hematology and Oncology

## 2014-05-03 ENCOUNTER — Telehealth: Payer: Self-pay | Admitting: Hematology and Oncology

## 2014-05-03 VITALS — BP 156/78 | HR 78 | Temp 97.9°F | Resp 18 | Ht 63.0 in | Wt 130.7 lb

## 2014-05-03 DIAGNOSIS — C50211 Malignant neoplasm of upper-inner quadrant of right female breast: Secondary | ICD-10-CM

## 2014-05-03 DIAGNOSIS — Z170 Estrogen receptor positive status [ER+]: Secondary | ICD-10-CM

## 2014-05-03 DIAGNOSIS — M810 Age-related osteoporosis without current pathological fracture: Secondary | ICD-10-CM

## 2014-05-03 NOTE — Telephone Encounter (Signed)
, °

## 2014-05-03 NOTE — Progress Notes (Signed)
Patient Care Team: Asencion Noble, MD as PCP - General (Internal Medicine)  DIAGNOSIS: Breast cancer of upper-inner quadrant of right female breast   Staging form: Breast, AJCC 7th Edition     Clinical: No stage assigned - Unsigned     Pathologic: Stage IA (T1c, N0, cM0) - Signed by Rulon Eisenmenger, MD on 01/11/2014   SUMMARY OF ONCOLOGIC HISTORY:   Breast cancer of upper-inner quadrant of right female breast   12/11/2013 Breast MRI Right breast upper inner quadrant 2.1 x 1.6 x 1.6 cm oval mass no abnormal lymph nodes   12/14/2013 Initial Diagnosis Breast cancer of upper-inner quadrant of right female breast   12/23/2013 Surgery 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 She is tolerating tamoxifen extremely well without any major problems or concerns. Very occasional hot flashes. She went to [place] for vacation. She reports that the hot flashes are also slowly getting better over time.  REVIEW OF SYSTEMS:   Constitutional: Denies fevers, chills or abnormal weight loss Eyes: Denies blurriness of vision Ears, nose, mouth, throat, and face: Denies mucositis or sore throat Respiratory: Denies cough, dyspnea or wheezes Cardiovascular: Denies palpitation, chest discomfort or lower extremity swelling Gastrointestinal:  Denies nausea, heartburn or change in bowel habits Skin: Denies abnormal skin rashes Lymphatics: Denies new  lymphadenopathy or easy bruising Neurological:Denies numbness, tingling or new weaknesses Behavioral/Psych: Mood is stable, no new changes  Breast:  denies any pain or lumps or nodules in either breasts All other systems were reviewed with the patient and are negative.  I have reviewed the past medical history, past surgical history, social history and family history with the patient and they are unchanged from previous note.  ALLERGIES:  is allergic to latex.  MEDICATIONS:  Current Outpatient Prescriptions  Medication Sig Dispense Refill  . fish oil-omega-3 fatty acids 1000 MG capsule Take 1,000 g by mouth daily.      . Multiple Vitamin (MULTIVITAMIN) capsule Take 1 capsule by mouth daily.      . tamoxifen (NOLVADEX) 20 MG tablet Take 1 tablet (20 mg total) by mouth daily. 90 tablet 3  . tretinoin (RETIN-A) 0.1 % cream     . Vitamin D, Ergocalciferol, (DRISDOL) 50000 UNITS CAPS Take 50,000 Units by mouth every 30 (thirty) days.     No current facility-administered medications for this visit.    PHYSICAL EXAMINATION: ECOG PERFORMANCE STATUS: 0 - Asymptomatic  There were no vitals filed for this visit. There were no vitals filed for this visit.  GENERAL:alert, no distress and comfortable SKIN: skin color, texture, turgor are normal, no rashes or significant lesions EYES: normal, Conjunctiva are pink and non-injected, sclera clear OROPHARYNX:no exudate, no erythema and lips, buccal mucosa, and tongue normal  NECK: supple, thyroid normal size, non-tender, without nodularity LYMPH:  no palpable lymphadenopathy in the cervical, axillary or inguinal LUNGS: clear to auscultation and percussion with normal breathing effort HEART: regular rate & rhythm and no murmurs and  no lower extremity edema ABDOMEN:abdomen soft, non-tender and normal bowel sounds Musculoskeletal:no cyanosis of digits and no clubbing  NEURO: alert & oriented x 3 with fluent speech, no focal motor/sensory  deficits LABORATORY DATA:  I have reviewed the data as listed   Chemistry      Component Value Date/Time   NA 142 12/22/2013 1430   K 3.8 12/22/2013 1430   CL 103 12/22/2013 1430   CO2 27 12/22/2013 1430   BUN 11 12/22/2013 1430   CREATININE 0.72 12/22/2013 1430      Component Value Date/Time   CALCIUM 9.0 12/22/2013 1430       Lab Results  Component Value Date   WBC 5.8 12/22/2013   HGB 14.7 12/22/2013   HCT 42.0 12/22/2013   MCV 99.8 12/22/2013   PLT 233 12/22/2013   NEUTROABS 3.5 12/22/2013   ASSESSMENT & PLAN:  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 Occasional hot flashes Otherwise tolerating tamoxifen extremely well  Breast cancer surveillance: Mammograms and breast exams annually (July 2016)  Osteoporosis: Discussed with her the tamoxifen protects her from osteoporosis to a certain degree. Encouraged her to continue with calcium and vitamin D and discuss with her primary care physician regarding additional bisphosphonate treatment for osteoporosis. She was previously treated with Reclast. Return to clinic in 6 months for follow-up   No orders of the defined types were placed in this encounter.   The patient has a good understanding of the overall plan. she agrees with it. She will call with any problems that may develop before her next visit here.   Rulon Eisenmenger, MD 05/03/2014 9:11 AM

## 2014-05-03 NOTE — Assessment & Plan Note (Addendum)
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 Occasional hot flashes Otherwise tolerating tamoxifen extremely well  Breast cancer surveillance: Mammograms and breast exams annually (July 2016)  Osteoporosis: Discussed with her the tamoxifen protects her from osteoporosis to a certain degree. Encouraged her to continue with calcium and vitamin D and discuss with her primary care physician regarding additional bisphosphonate treatment for osteoporosis. She was previously treated with Reclast. Return to clinic in 6 months for follow-up

## 2014-08-12 ENCOUNTER — Other Ambulatory Visit: Payer: Self-pay | Admitting: Dermatology

## 2014-09-01 DIAGNOSIS — M810 Age-related osteoporosis without current pathological fracture: Secondary | ICD-10-CM | POA: Diagnosis not present

## 2014-09-01 DIAGNOSIS — Z79899 Other long term (current) drug therapy: Secondary | ICD-10-CM | POA: Diagnosis not present

## 2014-10-18 ENCOUNTER — Other Ambulatory Visit: Payer: Self-pay

## 2014-10-31 NOTE — Assessment & Plan Note (Signed)
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 Occasional hot flashes Otherwise tolerating tamoxifen extremely well  Breast cancer surveillance: Mammograms and breast exams annually (July 2016)  Osteoporosis: on calcium and Vit D  RTC in 6 months

## 2014-11-01 ENCOUNTER — Other Ambulatory Visit: Payer: Self-pay | Admitting: Hematology and Oncology

## 2014-11-01 ENCOUNTER — Encounter: Payer: Self-pay | Admitting: Hematology and Oncology

## 2014-11-01 ENCOUNTER — Ambulatory Visit (HOSPITAL_BASED_OUTPATIENT_CLINIC_OR_DEPARTMENT_OTHER): Payer: Medicare PPO | Admitting: Hematology and Oncology

## 2014-11-01 ENCOUNTER — Telehealth: Payer: Self-pay | Admitting: Hematology and Oncology

## 2014-11-01 VITALS — BP 165/75 | HR 82 | Temp 98.2°F | Resp 18 | Ht 63.0 in | Wt 128.7 lb

## 2014-11-01 DIAGNOSIS — C50211 Malignant neoplasm of upper-inner quadrant of right female breast: Secondary | ICD-10-CM | POA: Diagnosis not present

## 2014-11-01 DIAGNOSIS — G629 Polyneuropathy, unspecified: Secondary | ICD-10-CM | POA: Diagnosis not present

## 2014-11-01 DIAGNOSIS — M810 Age-related osteoporosis without current pathological fracture: Secondary | ICD-10-CM

## 2014-11-01 DIAGNOSIS — N951 Menopausal and female climacteric states: Secondary | ICD-10-CM | POA: Diagnosis not present

## 2014-11-01 NOTE — Telephone Encounter (Signed)
no vm and no answer/////mailed pt appt shced/avs and letter

## 2014-11-01 NOTE — Progress Notes (Signed)
Patient Care Team: Asencion Noble, MD as PCP - General (Internal Medicine)  DIAGNOSIS: Breast cancer of upper-inner quadrant of right female breast   Staging form: Breast, AJCC 7th Edition     Clinical: No stage assigned - Unsigned     Pathologic: Stage IA (T1c, N0, cM0) - Signed by Rulon Eisenmenger, MD on 01/11/2014   SUMMARY OF ONCOLOGIC HISTORY:   Breast cancer of upper-inner quadrant of right female breast   12/11/2013 Breast MRI Right breast upper inner quadrant 2.1 x 1.6 x 1.6 cm oval mass no abnormal lymph nodes   12/14/2013 Initial Diagnosis Breast cancer of upper-inner quadrant of right female breast   12/23/2013 Surgery 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 She was tolerating tamoxifen fairly well but she complains of neuropathy in her legs up to her calf. She attributes this to being on tamoxifen therapy. She also complains of right shoulder aches. She takes Xanax for sleep.  REVIEW OF SYSTEMS:   Constitutional: Denies fevers, chills or abnormal weight loss Eyes: Denies blurriness of vision Ears, nose, mouth, throat, and face: Denies mucositis or sore throat Respiratory: Denies cough, dyspnea or wheezes Cardiovascular: Denies palpitation, chest discomfort or lower extremity swelling Gastrointestinal:  Denies nausea, heartburn or change in bowel habits Skin: Denies abnormal skin rashes Lymphatics: Denies new lymphadenopathy or easy  bruising Neurological:neuropathy lower extremities Behavioral/Psych:takes Xanax for sleep  Breast:  denies any pain or lumps or nodules in either breasts All other systems were reviewed with the patient and are negative.  I have reviewed the past medical history, past surgical history, social history and family history with the patient and they are unchanged from previous note.  ALLERGIES:  is allergic to latex.  MEDICATIONS:  Current Outpatient Prescriptions  Medication Sig Dispense Refill  . ALPRAZolam (XANAX) 0.5 MG tablet Take 0.5 mg by mouth at bedtime as needed for anxiety.    . fish oil-omega-3 fatty acids 1000 MG capsule Take 1,000 g by mouth daily.      . Multiple Vitamin (MULTIVITAMIN) capsule Take 1 capsule by mouth daily.      . tamoxifen (NOLVADEX) 20 MG tablet Take 1 tablet (20 mg total) by mouth daily. 90 tablet 3  . tretinoin (RETIN-A) 0.1 % cream     . Vitamin D, Ergocalciferol, (DRISDOL) 50000 UNITS CAPS Take 50,000 Units by mouth every 30 (thirty) days.     No current facility-administered medications for this visit.    PHYSICAL EXAMINATION: ECOG PERFORMANCE STATUS: 1 - Symptomatic but completely ambulatory  Filed Vitals:   11/01/14 0949  BP: 165/75  Pulse: 82  Temp: 98.2 F (36.8 C)  Resp: 18   Filed Weights   11/01/14 0949  Weight: 128 lb 11.2 oz (58.378 kg)    GENERAL:alert, no distress and comfortable SKIN: skin color, texture, turgor are normal, no rashes or significant lesions EYES: normal, Conjunctiva are pink and non-injected, sclera clear OROPHARYNX:no exudate, no erythema and lips, buccal mucosa, and tongue normal  NECK: supple, thyroid normal size, non-tender, without nodularity LYMPH:  no palpable lymphadenopathy in the cervical, axillary or inguinal LUNGS: clear to auscultation and percussion with normal breathing effort HEART: regular rate & rhythm and no murmurs and no lower extremity edema ABDOMEN:abdomen soft, non-tender and normal  bowel sounds Musculoskeletal:no cyanosis of digits and no clubbing  NEURO: neuropathy up to the calf BREAST: No palpable masses or nodules in either right or left breasts. No palpable axillary supraclavicular or infraclavicular adenopathy no breast tenderness or nipple discharge. 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 severe hot flashes 2. Progressively worsening neuropathy: Patient attributes this to tamoxifen therapy. Although she cannot absolutely say that she did not have neuropathy prior to starting tamoxifen. So we decided to give her a 3 month break from tamoxifen and see if her symptoms get better. If it does not get better then we will consult neurology.  Breast cancer surveillance: Mammograms and breast exams annually (July 2016)  Osteoporosis: on calcium and Vit D  RTC in 3 months to see the neuropathy is better.   No orders of the defined types were placed in this encounter.   The patient has a good understanding of the overall plan. she agrees with it. she will call with any problems that may develop before the next  visit here.   Rulon Eisenmenger, MD

## 2014-11-25 ENCOUNTER — Other Ambulatory Visit: Payer: Self-pay | Admitting: Obstetrics and Gynecology

## 2014-11-25 ENCOUNTER — Telehealth: Payer: Self-pay | Admitting: Obstetrics and Gynecology

## 2014-11-25 DIAGNOSIS — Z853 Personal history of malignant neoplasm of breast: Secondary | ICD-10-CM

## 2014-11-25 NOTE — Telephone Encounter (Signed)
Spoke with Kaitlyn Porter at the [facility] patient was due for bilateral diagnostic mammogram on 11/14/2014. Next BMD is due 11/14/2015.  Left message to call [place] at [phone].

## 2014-11-25 NOTE — Telephone Encounter (Signed)
Patient is asking when is he due for her next MMG and BMD. Last seen 11/25/13.

## 2014-11-25 NOTE — Telephone Encounter (Signed)
Spoke with patient. Advised of message as seen below regarding scheduling. Patient is agreeable and will call [facility] at [facility].   Routing to provider for final review. Patient agreeable to disposition. Will close encounter.   Patient aware provider will review message and nurse will return call if any additional advice or change of disposition.

## 2014-12-01 ENCOUNTER — Ambulatory Visit
Admission: RE | Admit: 2014-12-01 | Discharge: 2014-12-01 | Disposition: A | Discharge status: Home / Self Care | Payer: Medicare PPO | Source: Ambulatory Visit | Attending: Obstetrics and Gynecology | Admitting: Obstetrics and Gynecology

## 2014-12-01 DIAGNOSIS — Z853 Personal history of malignant neoplasm of breast: Secondary | ICD-10-CM | POA: Diagnosis not present

## 2014-12-01 DIAGNOSIS — R928 Other abnormal and inconclusive findings on diagnostic imaging of breast: Secondary | ICD-10-CM | POA: Diagnosis not present

## 2014-12-15 ENCOUNTER — Encounter: Payer: Self-pay | Admitting: Obstetrics and Gynecology

## 2014-12-15 ENCOUNTER — Ambulatory Visit (INDEPENDENT_AMBULATORY_CARE_PROVIDER_SITE_OTHER): Payer: Medicare PPO | Admitting: Obstetrics and Gynecology

## 2014-12-15 VITALS — BP 134/76 | HR 80 | Resp 14 | Ht 63.5 in | Wt 129.0 lb

## 2014-12-15 DIAGNOSIS — Z01419 Encounter for gynecological examination (general) (routine) without abnormal findings: Secondary | ICD-10-CM | POA: Diagnosis not present

## 2014-12-15 NOTE — Progress Notes (Signed)
Patient ID: Kaitlyn Porter, female   DOB: 1946-01-09, 69 y.o.   MRN: [id number] 69 y.o. G2P2 Married Caucasian female here for annual exam.    No bleeding or spotting.   New diagnosis of breast cancer in 2015.  Status post lumpectomy with Dr. Barry Dienes - September 2015.  Did radiation following this.  Off Tamoxifen for  6 weeks.  Plan to be off for 3 months and then reassess. Was on for breast cancer treatment.  Off of HRT.   PCP:   Asencion Noble, MD  Patient's last menstrual period was 04/23/1993.          Sexually active: Yes.   female The current method of family planning is tubal ligation./postmenopausal.    Exercising: Yes.    line dancing. Smoker:  Former, quit [place] Maintenance: Pap:  2011 normal History of abnormal Pap:  no MMG:  12-01-14 Diag.Bil./Hx Rt.breast lumpectomy for breast cancer in 2015--[facility].C/Neg:[facility]. Colonoscopy:  2013 normal with Dr. Laural Golden in [place].  Next due in 2023. BMD:   11-13-13  Result  Osteoporosis--mild improvement:Took Reclast for several years: TDaP:  Up to date with PCP Screening Labs:  Hb today: PCP, Urine today: PCP   reports that she quit smoking about 41 years ago. She has never used smokeless tobacco. She reports that she does not drink alcohol or use illicit drugs.  Past Medical History  Diagnosis Date  . Osteoporosis   . Fibroids 12/2008    history of  . Skin cancer     basal cell  . Breast cancer, right breast August 2015    lumpectomy and radiation therapy    Past Surgical History  Procedure Laterality Date  . Tonsillectomy    . Appendectomy    . Vaginal delivery      times 2  . Colonoscopy  10 years ago    dr Laural Golden  . Colonoscopy  12/07/2010    Procedure: COLONOSCOPY;  Surgeon: Rogene Houston, MD;  Location: [facility];  Service: Endoscopy;  Laterality: N/A;  . Tubal ligation  1976  . Toe arthroscopy  2005    rt great toe spurs  . Breast surgery  2015    right lumpectomy for breast cancer     Current Outpatient Prescriptions  Medication Sig Dispense Refill  . ALPRAZolam (XANAX) 0.5 MG tablet Take 0.5 mg by mouth at bedtime as needed for anxiety.    . fish oil-omega-3 fatty acids 1000 MG capsule Take 1,000 g by mouth daily.      . Multiple Vitamin (MULTIVITAMIN) capsule Take 1 capsule by mouth daily.      . tamoxifen (NOLVADEX) 20 MG tablet Take 1 tablet (20 mg total) by mouth daily. 90 tablet 3  . tretinoin (RETIN-A) 0.1 % cream     . Vitamin D, Ergocalciferol, (DRISDOL) 50000 UNITS CAPS Take 50,000 Units by mouth every 30 (thirty) days.     No current facility-administered medications for this visit.    Family History  Problem Relation Age of Onset  . Diabetes Brother   . Cancer Maternal Grandfather   . Heart failure Mother   . Aneurysm Mother     abdominal    ROS:  Pertinent items are noted in HPI.  Otherwise, a comprehensive ROS was negative.  Exam:   BP 134/76 mmHg  Pulse 80  Resp 14  Ht 5' 3.5" (1.613 m)  Wt 129 lb (58.514 kg)  BMI 22.49 kg/m2  LMP 04/23/1993  General appearance: alert, cooperative and appears stated age Head: Normocephalic, without obvious abnormality, atraumatic Neck: no adenopathy, supple, symmetrical, trachea midline and thyroid normal to inspection and palpation Lungs: clear to auscultation bilaterally Breasts: normal appearance, no masses or tenderness, Inspection negative, No nipple retraction or dimpling, No nipple discharge or bleeding, No axillary or supraclavicular adenopathy Heart: regular rate and rhythm Abdomen: soft, non-tender; bowel sounds normal; no masses,  no organomegaly Extremities: extremities normal, atraumatic, no cyanosis or edema Skin: Skin color, texture, turgor normal. No rashes or lesions Lymph nodes: Cervical, supraclavicular, and axillary nodes normal. No abnormal inguinal nodes palpated Neurologic: Grossly normal  Pelvic: External genitalia:  no lesions              Urethra:  normal appearing  urethra with no masses, tenderness or lesions              Bartholins and Skenes: normal                 Vagina: normal appearing vagina with normal color and discharge, no lesions              Cervix: no lesions              Pap taken: Yes.   Bimanual Exam:  Uterus:  normal size, contour, position, consistency, mobility, non-tender              Adnexa: normal adnexa and no mass, fullness, tenderness              Rectovaginal: Yes.  .  Confirms.              Anus:  normal sphincter tone, no lesions  Chaperone was present for exam.  Assessment:   Well woman visit with normal exam. Invasive right breast cancer. Recent discontinuation of Tamoxifen.  Osteoporosis.  T scores improved. Status post Reclast. Menopausal symptoms.  Plan: Yearly mammogram recommended after age 40.  Recommended self breast exam.  Pap and HR HPV as above. Discussed Calcium, Vitamin D, regular exercise program including cardiovascular and weight bearing exercise. Bone density in 2017.  Labs performed.  No..   See orders. Refills given on medications.  No..  See orders. Discussed Brisdelle and Effexor.  Patient will consider.  Information of Effexor. Follow up with oncology for routine visit.  Follow up annually and prn.      After visit summary provided.

## 2014-12-15 NOTE — Patient Instructions (Signed)
EXERCISE AND DIET:  We recommended that you start or continue a regular exercise program for good health. Regular exercise means any activity that makes your heart beat faster and makes you sweat.  We recommend exercising at least 30 minutes per day at least 3 days a week, preferably 4 or 5.  We also recommend a diet low in fat and sugar.  Inactivity, poor dietary choices and obesity can cause diabetes, heart attack, stroke, and kidney damage, among others.    ALCOHOL AND SMOKING:  Women should limit their alcohol intake to no more than 7 drinks/beers/glasses of wine (combined, not each!) per week. Moderation of alcohol intake to this level decreases your risk of breast cancer and liver damage. And of course, no recreational drugs are part of a healthy lifestyle.  And absolutely no smoking or even second hand smoke. Most people know smoking can cause heart and lung diseases, but did you know it also contributes to weakening of your bones? Aging of your skin?  Yellowing of your teeth and nails?  CALCIUM AND VITAMIN D:  Adequate intake of calcium and Vitamin D are recommended.  The recommendations for exact amounts of these supplements seem to change often, but generally speaking 600 mg of calcium (either carbonate or citrate) and 800 units of Vitamin D per day seems prudent. Certain women may benefit from higher intake of Vitamin D.  If you are among these women, your doctor will have told you during your visit.    PAP SMEARS:  Pap smears, to check for cervical cancer or precancers,  have traditionally been done yearly, although recent scientific advances have shown that most women can have pap smears less often.  However, every woman still should have a physical exam from her gynecologist every year. It will include a breast check, inspection of the vulva and vagina to check for abnormal growths or skin changes, a visual exam of the cervix, and then an exam to evaluate the size and shape of the uterus and  ovaries.  And after 71 years of age, a rectal exam is indicated to check for rectal cancers. We will also provide age appropriate advice regarding health maintenance, like when you should have certain vaccines, screening for sexually transmitted diseases, bone density testing, colonoscopy, mammograms, etc.   MAMMOGRAMS:  All women over 40 years old should have a yearly mammogram. Many facilities now offer a "3D" mammogram, which may cost around $50 extra out of pocket. If possible,  we recommend you accept the option to have the 3D mammogram performed.  It both reduces the number of women who will be called back for extra views which then turn out to be normal, and it is better than the routine mammogram at detecting truly abnormal areas.    COLONOSCOPY:  Colonoscopy to screen for colon cancer is recommended for all women at age 50.  We know, you hate the idea of the prep.  We agree, BUT, having colon cancer and not knowing it is worse!!  Colon cancer so often starts as a polyp that can be seen and removed at colonscopy, which can quite literally save your life!  And if your first colonoscopy is normal and you have no family history of colon cancer, most women don't have to have it again for 10 years.  Once every ten years, you can do something that may end up saving your life, right?  We will be happy to help you get it scheduled when you are ready.    Be sure to check your insurance coverage so you understand how much it will cost.  It may be covered as a preventative service at no cost, but you should check your particular policy.     Venlafaxine extended-release capsules What is this medicine? VENLAFAXINE(VEN la fax een) is used to treat depression, anxiety and panic disorder. This medicine may be used for other purposes; ask your health care provider or pharmacist if you have questions. COMMON BRAND NAME(S): Effexor XR What should I tell my health care provider before I take this medicine? They need  to know if you have any of these conditions: -bleeding disorders -glaucoma -heart disease -high blood pressure -high cholesterol -kidney disease -liver disease -low levels of sodium in the blood -mania or bipolar disorder -seizures -suicidal thoughts, plans, or attempt; a previous suicide attempt by you or a family -take medicines that treat or prevent blood clots -thyroid disease -an unusual or allergic reaction to venlafaxine, desvenlafaxine, other medicines, foods, dyes, or preservatives -pregnant or trying to get pregnant -breast-feeding How should I use this medicine? Take this medicine by mouth with a full glass of water. Follow the directions on the prescription label. Do not cut, crush, or chew this medicine. Take it with food. If needed, the capsule may be carefully opened and the entire contents sprinkled on a spoonful of cool applesauce. Swallow the applesauce/pellet mixture right away without chewing and follow with a glass of water to ensure complete swallowing of the pellets. Try to take your medicine at about the same time each day. Do not take your medicine more often than directed. Do not stop taking this medicine suddenly except upon the advice of your doctor. Stopping this medicine too quickly may cause serious side effects or your condition may worsen. A special MedGuide will be given to you by the pharmacist with each prescription and refill. Be sure to read this information carefully each time. Talk to your pediatrician regarding the use of this medicine in children. Special care may be needed. Overdosage: If you think you have taken too much of this medicine contact a poison control center or emergency room at once. NOTE: This medicine is only for you. Do not share this medicine with others. What if I miss a dose? If you miss a dose, take it as soon as you can. If it is almost time for your next dose, take only that dose. Do not take double or extra doses. What may  interact with this medicine? Do not take this medicine with any of the following medications: -certain medicines for fungal infections like fluconazole, itraconazole, ketoconazole, posaconazole, voriconazole -cisapride -desvenlafaxine -dofetilide -dronedarone -duloxetine -levomilnacipran -linezolid -MAOIs like Carbex, Eldepryl, Marplan, Nardil, and Parnate -methylene blue (injected into a vein) -milnacipran -pimozide -thioridazine -ziprasidone This medicine may also interact with the following medications: -aspirin and aspirin-like medicines -certain medicines for depression, anxiety, or psychotic disturbances -certain medicines for migraine headaches like almotriptan, eletriptan, frovatriptan, naratriptan, rizatriptan, sumatriptan, zolmitriptan -certain medicines for sleep -certain medicines that treat or prevent blood clots like dalteparin, enoxaparin, warfarin -cimetidine -clozapine -diuretics -fentanyl -furazolidone -indinavir -isoniazid -lithium -metoprolol -NSAIDS, medicines for pain and inflammation, like ibuprofen or naproxen -other medicines that prolong the QT interval (cause an abnormal heart rhythm) -procarbazine -rasagiline -supplements like St. John's wort, kava kava, valerian -tramadol -tryptophan This list may not describe all possible interactions. Give your health care provider a list of all the medicines, herbs, non-prescription drugs, or dietary supplements you use. Also tell them if you  smoke, drink alcohol, or use illegal drugs. Some items may interact with your medicine. What should I watch for while using this medicine? Tell your doctor if your symptoms do not get better or if they get worse. Visit your doctor or health care professional for regular checks on your progress. Because it may take several weeks to see the full effects of this medicine, it is important to continue your treatment as prescribed by your doctor. Patients and their families  should watch out for new or worsening thoughts of suicide or depression. Also watch out for sudden changes in feelings such as feeling anxious, agitated, panicky, irritable, hostile, aggressive, impulsive, severely restless, overly excited and hyperactive, or not being able to sleep. If this happens, especially at the beginning of treatment or after a change in dose, call your health care professional. This medicine can cause an increase in blood pressure. Check with your doctor for instructions on monitoring your blood pressure while taking this medicine. You may get drowsy or dizzy. Do not drive, use machinery, or do anything that needs mental alertness until you know how this medicine affects you. Do not stand or sit up quickly, especially if you are an older patient. This reduces the risk of dizzy or fainting spells. Alcohol may interfere with the effect of this medicine. Avoid alcoholic drinks. Your mouth may get dry. Chewing sugarless gum, sucking hard candy and drinking plenty of water will help. Contact your doctor if the problem does not go away or is severe. What side effects may I notice from receiving this medicine? Side effects that you should report to your doctor or health care professional as soon as possible: -allergic reactions like skin rash, itching or hives, swelling of the face, lips, or tongue -breathing problems -changes in vision -hallucination, loss of contact with reality -seizures -suicidal thoughts or other mood changes -trouble passing urine or change in the amount of urine -unusual bleeding or bruising Side effects that usually do not require medical attention (report to your doctor or health care professional if they continue or are bothersome): -change in sex drive or performance -constipation -increased sweating -loss of appetite -nausea -tremors -weight loss This list may not describe all possible side effects. Call your doctor for medical advice about side  effects. You may report side effects to FDA at 1-800-FDA-1088. Where should I keep my medicine? Keep out of the reach of children. Store at a controlled temperature between 20 and 25 degrees C (68 degrees and 77 degrees F), in a dry place. Throw away any unused medicine after the expiration date. NOTE: This sheet is a summary. It may not cover all possible information. If you have questions about this medicine, talk to your doctor, pharmacist, or health care provider.  2015, [employer]. (2012-11-04 12:46:03)

## 2014-12-17 LAB — IPS PAP SMEAR ONLY

## 2014-12-20 ENCOUNTER — Telehealth: Payer: Self-pay | Admitting: Obstetrics and Gynecology

## 2014-12-20 MED ORDER — VENLAFAXINE HCL ER 37.5 MG PO CP24: 37.5000 mg | ORAL_CAPSULE | Freq: Every day | ORAL | Status: DC

## 2014-12-20 NOTE — Telephone Encounter (Signed)
Spoke with patient. Advised of message as seen below from [place]. Patient would like rx to be sent as #[phone]. Rx for Effexor 37.5 mg daily #[phone] sent to [facility] on file. Follow up appointment scheduled for 02/09/2015 at 10:30 am with Dr.Silva. Patient is agreeable to date and time.  Routing to provider for final review. Patient agreeable to disposition. Will close encounter.

## 2014-12-20 NOTE — Telephone Encounter (Signed)
Encounter closed in error. Please see note below.

## 2014-12-20 NOTE — Telephone Encounter (Signed)
Spoke with patient. Patient states that she has read all the information regarding Effexor and would like to start on this medication for her hot flashes. Advised I will let Dr.Silva know so this rx may be sent to her pharmacy. Patient prefers this rx go to the [facility] on file.

## 2014-12-20 NOTE — Telephone Encounter (Signed)
Left message to call [place] at [phone].  Per OV note from Dr.Silva on 12/15/2014. Patient was given information on Brisdelle and Effexor.

## 2014-12-20 NOTE — Addendum Note (Signed)
Addended by: Jasmine Awe on: 12/20/2014 04:20 PM   Modules accepted: Orders

## 2014-12-20 NOTE — Telephone Encounter (Signed)
Ok for Effexor XR 37.5 mg daily.  Please send to pharmacy of choice.  #30, RF 2.  (It is OK if the patient would like #90, RF zero instead).  I recommend follow up in 6 weeks for a recheck.

## 2014-12-20 NOTE — Telephone Encounter (Signed)
Patient wants to start hormone medication. She states she has already read the information Dr. Quincy Simmonds gave her and she is ready now.

## 2015-01-24 ENCOUNTER — Encounter: Payer: Self-pay | Admitting: Hematology and Oncology

## 2015-01-24 ENCOUNTER — Telehealth: Payer: Self-pay | Admitting: Hematology and Oncology

## 2015-01-24 ENCOUNTER — Ambulatory Visit (HOSPITAL_BASED_OUTPATIENT_CLINIC_OR_DEPARTMENT_OTHER): Payer: Medicare PPO | Admitting: Hematology and Oncology

## 2015-01-24 VITALS — BP 144/76 | HR 79 | Temp 98.2°F | Resp 18 | Ht 63.5 in | Wt 128.8 lb

## 2015-01-24 DIAGNOSIS — M810 Age-related osteoporosis without current pathological fracture: Secondary | ICD-10-CM | POA: Diagnosis not present

## 2015-01-24 DIAGNOSIS — C50211 Malignant neoplasm of upper-inner quadrant of right female breast: Secondary | ICD-10-CM | POA: Diagnosis not present

## 2015-01-24 DIAGNOSIS — N951 Menopausal and female climacteric states: Secondary | ICD-10-CM

## 2015-01-24 DIAGNOSIS — G620 Drug-induced polyneuropathy: Secondary | ICD-10-CM | POA: Diagnosis not present

## 2015-01-24 MED ORDER — ANASTROZOLE 1 MG PO TABS: 1.0000 mg | ORAL_TABLET | Freq: Every day | ORAL | Status: DC

## 2015-01-24 NOTE — Assessment & Plan Note (Signed)
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 severe hot flashes 2. Progressively worsening neuropathy: This neuropathy improved after getting a treatment break from tamoxifen for 3 months.  Treatment plan: Anastrozole 1 mg by mouth daily starting 01/24/2015. Osteoporosis: Patient had T score of -2.5 and -2.6. She has profound high increased risk of fractures. This is especially worse since we are stopping her antiestrogen therapy with anastrozole. After lengthy discussion, we decided to initiate Prolia every 6 months with each of her follow-up visits. This should help with osteoporosis treatment. I also discussed with her that there is no data regarding breast cancer prevention from bisphosphonate therapy.  Return to clinic in 6 months for follow-up or sooner if she cannot tolerate anastrozole.

## 2015-01-24 NOTE — Telephone Encounter (Signed)
Appointments made and avs printed for patient °

## 2015-01-24 NOTE — Progress Notes (Signed)
Patient Care Team: Asencion Noble, MD as PCP - General (Internal Medicine)  DIAGNOSIS: Breast cancer of upper-inner quadrant of right female breast [facility])   Staging form: Breast, AJCC 7th Edition     Clinical: No stage assigned - Unsigned     Pathologic: Stage IA (T1c, N0, cM0) - Signed by Rulon Eisenmenger, MD on 01/11/2014   SUMMARY OF ONCOLOGIC HISTORY:   Breast cancer of upper-inner quadrant of right female breast ([place])   12/11/2013 Breast MRI Right breast upper inner quadrant 2.1 x 1.6 x 1.6 cm oval mass no abnormal lymph nodes   12/14/2013 Initial Diagnosis Breast cancer of upper-inner quadrant of right female breast   12/23/2013 Surgery 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, 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, lymphovascular invasion is found, for sentinel lymph nodes negative ER 100% PR 100% HER-2 negative ratio 1.1, Ki-67 14%; Oncotype DX score of 15 (10% ROR)   02/15/2014 - 04/02/2014 Radiation Therapy Adjuvant radiation therapy   04/06/2014 -  Anti-estrogen oral therapy Tamoxifen 20 mg daily (developed neuropathy), switched to anastrozole 1 mg daily 01/24/2015    CHIEF COMPLIANT: 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 She stopped tamoxifen for the last 3 months and her symptoms have resolved. She would like to know what alternative medication she could take. Patient has profound osteoporosis but is not on any osteoporosis treatment. She does not wish to take any oral medications for osteoporosis.  REVIEW OF SYSTEMS:   Constitutional: Denies fevers, chills or abnormal weight loss Eyes: Denies blurriness of vision Ears, nose, mouth, throat, and face: Denies mucositis or sore throat Respiratory: Denies cough, dyspnea or wheezes Cardiovascular: Denies palpitation, chest discomfort or  lower extremity swelling Gastrointestinal:  Denies nausea, heartburn or change in bowel habits Skin: Denies abnormal skin rashes Lymphatics: Denies new lymphadenopathy or easy bruising Neurological: Resolution of neuropathy Behavioral/Psych: Mood is stable, no new changes  Breast:  denies any pain or lumps or nodules in either breasts All other systems were reviewed with the patient and are negative.  I have reviewed the past medical history, past surgical history, social history and family history with the patient and they are unchanged from previous note.  ALLERGIES:  is allergic to latex.  MEDICATIONS:  Current Outpatient Prescriptions  Medication Sig Dispense Refill  . ALPRAZolam (XANAX) 0.5 MG tablet Take 0.5 mg by mouth at bedtime as needed for anxiety.    . fish oil-omega-3 fatty acids 1000 MG capsule Take 1,000 g by mouth daily.      . Multiple Vitamin (MULTIVITAMIN) capsule Take 1 capsule by mouth daily.      Marland Kitchen tretinoin (RETIN-A) 0.1 % cream     . venlafaxine XR (EFFEXOR XR) 37.5 MG 24 hr capsule Take 1 capsule (37.5 mg total) by mouth daily with breakfast. 90 capsule 0  . Vitamin D, Ergocalciferol, (DRISDOL) 50000 UNITS CAPS Take 50,000 Units by mouth every 30 (thirty) days.    Marland Kitchen anastrozole (ARIMIDEX) 1 MG tablet Take 1 tablet (1 mg total) by mouth daily. 90 tablet 3   No current facility-administered medications for this visit.    PHYSICAL EXAMINATION: ECOG PERFORMANCE STATUS: 1 - Symptomatic but completely ambulatory  Filed Vitals:   01/24/15 1026  BP: 144/76  Pulse: 79  Temp: 98.2 F (36.8 C)  Resp: 18   Filed Weights  01/24/15 1026  Weight: 128 lb 12.8 oz (58.423 kg)    GENERAL:alert, no distress and comfortable SKIN: skin color, texture, turgor are normal, no rashes or significant lesions EYES: normal, Conjunctiva are pink and non-injected, sclera clear OROPHARYNX:no exudate, no erythema and lips, buccal mucosa, and tongue normal  NECK: supple, thyroid  normal size, non-tender, without nodularity LYMPH:  no palpable lymphadenopathy in the cervical, axillary or inguinal LUNGS: clear to auscultation and percussion with normal breathing effort HEART: regular rate & rhythm and no murmurs and no lower extremity edema ABDOMEN:abdomen soft, non-tender and normal bowel sounds Musculoskeletal:no cyanosis of digits and no clubbing  NEURO: alert & oriented x 3 with fluent speech, no focal motor/sensory deficits  LABORATORY DATA:  I have reviewed the data as listed   Chemistry      Component Value Date/Time   NA 142 12/22/2013 1430   K 3.8 12/22/2013 1430   CL 103 12/22/2013 1430   CO2 27 12/22/2013 1430   BUN 11 12/22/2013 1430   CREATININE 0.72 12/22/2013 1430      Component Value Date/Time   CALCIUM 9.0 12/22/2013 1430       Lab Results  Component Value Date   WBC 5.8 12/22/2013   HGB 14.7 12/22/2013   HCT 42.0 12/22/2013   MCV 99.8 12/22/2013   PLT 233 12/22/2013   NEUTROABS 3.5 12/22/2013   ASSESSMENT & PLAN:  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 severe hot flashes 2. Progressively worsening neuropathy: This neuropathy improved after getting a treatment break from tamoxifen for 3 months.  Treatment plan: Anastrozole 1 mg by mouth daily starting 01/24/2015. Osteoporosis: Patient had T score of -2.5 and -2.6. She has profound high increased risk of fractures. This is especially worse since we are stopping her antiestrogen therapy with anastrozole. After lengthy discussion, we decided to initiate Prolia every 6 months with each of her follow-up visits. This should help with osteoporosis treatment. I also discussed with her that there is data  regarding breast cancer prevention from bisphosphonate therapy as well.  Return to clinic in 6 months for follow-up or sooner if she cannot tolerate anastrozole.  Orders Placed This Encounter  Procedures  . CBC with Differential    Standing Status: Future     Number of Occurrences:      Standing Expiration Date: 01/24/2016  . Comprehensive metabolic panel (Cmet) - [facility]    Standing Status: Future     Number of Occurrences:      Standing Expiration Date: 01/24/2016   The patient has a good understanding of the overall plan. she agrees with it. she will call with any problems that may develop before the next visit here.   Rulon Eisenmenger, MD

## 2015-01-31 ENCOUNTER — Ambulatory Visit (HOSPITAL_BASED_OUTPATIENT_CLINIC_OR_DEPARTMENT_OTHER): Payer: Medicare PPO

## 2015-01-31 VITALS — BP 152/75 | HR 82 | Temp 97.6°F | Resp 18

## 2015-01-31 DIAGNOSIS — C50211 Malignant neoplasm of upper-inner quadrant of right female breast: Secondary | ICD-10-CM | POA: Diagnosis not present

## 2015-01-31 DIAGNOSIS — M810 Age-related osteoporosis without current pathological fracture: Secondary | ICD-10-CM | POA: Diagnosis not present

## 2015-01-31 MED ORDER — SODIUM CHLORIDE 0.9 % IV SOLN: Freq: Once | INTRAVENOUS | Status: DC | PRN

## 2015-01-31 MED ORDER — DENOSUMAB 60 MG/ML ~~LOC~~ SOLN
60.0000 mg | Freq: Once | SUBCUTANEOUS | Status: AC
  Administered 2015-01-31: 60 mg via SUBCUTANEOUS
  Filled 2015-01-31: qty 1

## 2015-01-31 MED ORDER — ALBUTEROL SULFATE (2.5 MG/3ML) 0.083% IN NEBU
2.5000 mg | INHALATION_SOLUTION | Freq: Once | RESPIRATORY_TRACT | Status: DC | PRN
  Filled 2015-01-31: qty 3

## 2015-01-31 MED ORDER — DIPHENHYDRAMINE HCL 50 MG/ML IJ SOLN: 25.0000 mg | Freq: Once | INTRAMUSCULAR | Status: DC | PRN

## 2015-01-31 MED ORDER — DIPHENHYDRAMINE HCL 50 MG/ML IJ SOLN: 50.0000 mg | Freq: Once | INTRAMUSCULAR | Status: DC | PRN

## 2015-01-31 MED ORDER — METHYLPREDNISOLONE SODIUM SUCC 125 MG IJ SOLR: 125.0000 mg | Freq: Once | INTRAMUSCULAR | Status: DC | PRN

## 2015-01-31 NOTE — Patient Instructions (Signed)
Denosumab injection  What is this medicine?  DENOSUMAB (den oh sue mab) slows bone breakdown. Prolia is used to treat osteoporosis in women after menopause and in men. Xgeva is used to prevent bone fractures and other bone problems caused by cancer bone metastases. Xgeva is also used to treat giant cell tumor of the bone.  This medicine may be used for other purposes; ask your health care provider or pharmacist if you have questions.  What should I tell my health care provider before I take this medicine?  They need to know if you have any of these conditions:  -dental disease  -eczema  -infection or history of infections  -kidney disease or on dialysis  -low blood calcium or vitamin D  -malabsorption syndrome  -scheduled to have surgery or tooth extraction  -taking medicine that contains denosumab  -thyroid or parathyroid disease  -an unusual reaction to denosumab, other medicines, foods, dyes, or preservatives  -pregnant or trying to get pregnant  -breast-feeding  How should I use this medicine?  This medicine is for injection under the skin. It is given by a health care professional in a hospital or clinic setting.  If you are getting Prolia, a special MedGuide will be given to you by the pharmacist with each prescription and refill. Be sure to read this information carefully each time.  For Prolia, talk to your pediatrician regarding the use of this medicine in children. Special care may be needed. For Xgeva, talk to your pediatrician regarding the use of this medicine in children. While this drug may be prescribed for children as young as 13 years for selected conditions, precautions do apply.  Overdosage: If you think you have taken too much of this medicine contact a poison control center or emergency room at once.  NOTE: This medicine is only for you. Do not share this medicine with others.  What if I miss a dose?  It is important not to miss your dose. Call your doctor or health care professional if you are  unable to keep an appointment.  What may interact with this medicine?  Do not take this medicine with any of the following medications:  -other medicines containing denosumab  This medicine may also interact with the following medications:  -medicines that suppress the immune system  -medicines that treat cancer  -steroid medicines like prednisone or cortisone  This list may not describe all possible interactions. Give your health care provider a list of all the medicines, herbs, non-prescription drugs, or dietary supplements you use. Also tell them if you smoke, drink alcohol, or use illegal drugs. Some items may interact with your medicine.  What should I watch for while using this medicine?  Visit your doctor or health care professional for regular checks on your progress. Your doctor or health care professional may order blood tests and other tests to see how you are doing.  Call your doctor or health care professional if you get a cold or other infection while receiving this medicine. Do not treat yourself. This medicine may decrease your body's ability to fight infection.  You should make sure you get enough calcium and vitamin D while you are taking this medicine, unless your doctor tells you not to. Discuss the foods you eat and the vitamins you take with your health care professional.  See your dentist regularly. Brush and floss your teeth as directed. Before you have any dental work done, tell your dentist you are receiving this medicine.  Do   not become pregnant while taking this medicine or for 5 months after stopping it. Women should inform their doctor if they wish to become pregnant or think they might be pregnant. There is a potential for serious side effects to an unborn child. Talk to your health care professional or pharmacist for more information.  What side effects may I notice from receiving this medicine?  Side effects that you should report to your doctor or health care professional as soon as  possible:  -allergic reactions like skin rash, itching or hives, swelling of the face, lips, or tongue  -breathing problems  -chest pain  -fast, irregular heartbeat  -feeling faint or lightheaded, falls  -fever, chills, or any other sign of infection  -muscle spasms, tightening, or twitches  -numbness or tingling  -skin blisters or bumps, or is dry, peels, or red  -slow healing or unexplained pain in the mouth or jaw  -unusual bleeding or bruising  Side effects that usually do not require medical attention (Report these to your doctor or health care professional if they continue or are bothersome.):  -muscle pain  -stomach upset, gas  This list may not describe all possible side effects. Call your doctor for medical advice about side effects. You may report side effects to FDA at 1-800-FDA-1088.  Where should I keep my medicine?  This medicine is only given in a clinic, doctor's office, or other health care setting and will not be stored at home.  NOTE: This sheet is a summary. It may not cover all possible information. If you have questions about this medicine, talk to your doctor, pharmacist, or health care provider.      2016, Elsevier/Gold Standard. (2011-10-08 12:37:47)

## 2015-02-06 ENCOUNTER — Telehealth: Payer: Self-pay | Admitting: Hematology and Oncology

## 2015-02-06 NOTE — Telephone Encounter (Signed)
lvm for pt regarding to 4.11 appt moved to 4.17 due to md on pal....mailed pt appt sched adn letter

## 2015-02-07 ENCOUNTER — Telehealth: Payer: Self-pay | Admitting: *Deleted

## 2015-02-07 NOTE — Telephone Encounter (Signed)
Call from patient requesting "Dr. Geralyn Flash advice for dental problem.  I'm on Prolia.  Can I have a root canal on a tooth or does he advise an extraction?  A soft found on tooth, wnt to have filled but it's destroyed from inside out, too close to the neve and it hurts."  Return number [phone] (mobile). Will notify Dr. Lindi Adie.

## 2015-02-08 DIAGNOSIS — H2513 Age-related nuclear cataract, bilateral: Secondary | ICD-10-CM | POA: Diagnosis not present

## 2015-02-08 NOTE — Telephone Encounter (Signed)
Let pt know she needs to wait 2 months from last injection (10/10) prior to having the dental procedure.  Pt voiced understanding.

## 2015-02-09 ENCOUNTER — Ambulatory Visit (INDEPENDENT_AMBULATORY_CARE_PROVIDER_SITE_OTHER): Payer: Medicare PPO | Admitting: Obstetrics and Gynecology

## 2015-02-09 ENCOUNTER — Encounter: Payer: Self-pay | Admitting: Obstetrics and Gynecology

## 2015-02-09 VITALS — BP 126/80 | HR 72 | Resp 14 | Ht 63.5 in | Wt 129.0 lb

## 2015-02-09 DIAGNOSIS — N951 Menopausal and female climacteric states: Secondary | ICD-10-CM | POA: Diagnosis not present

## 2015-02-09 MED ORDER — VENLAFAXINE HCL ER 37.5 MG PO CP24: 37.5000 mg | ORAL_CAPSULE | Freq: Every day | ORAL | Status: DC

## 2015-02-09 NOTE — Progress Notes (Signed)
GYNECOLOGY  VISIT   HPI: 69 y.o.   Married  Caucasian  female   G2P2 with Patient's last menstrual period was 04/23/1993.   here for medication follow up - Effexor 37.5mg  daily. Taking Effexor for menopausal symptoms.  Very mild flashes.  No night sweats.  Feels her mood is better.  Feels more like herself now.  No weight gain or decrease in sex drive.  Wants to continue.   GYNECOLOGIC HISTORY: Patient's last menstrual period was 04/23/1993. Contraception: Post menopausal  Menopausal hormone therapy: None Last mammogram: 12/01/14 BIRADS2:Benign  Last pap smear: 12/15/14 Neg        OB History    Gravida Para Term Preterm AB TAB SAB Ectopic Multiple Living   2 2        1       Obstetric Comments   Menses 50 or 52 First live birth age 71 2 sons Hormonal replacement therapy (prem pro) 20 years Last menstrual cycle approximately. 20 to 25 years         Patient Active Problem List   Diagnosis Date Noted  . Osteoporosis 01/24/2015  . Breast cancer of upper-inner quadrant of right female breast ([place]) 12/14/2013    Past Medical History  Diagnosis Date  . Osteoporosis   . Fibroids 12/2008    history of  . Skin cancer     basal cell  . Breast cancer, right breast [facility]) August 2015    lumpectomy and radiation therapy    Past Surgical History  Procedure Laterality Date  . Tonsillectomy    . Appendectomy    . Vaginal delivery      times 2  . Colonoscopy  10 years ago    dr Laural Golden  . Colonoscopy  12/07/2010    Procedure: COLONOSCOPY;  Surgeon: Rogene Houston, MD;  Location: [facility];  Service: Endoscopy;  Laterality: N/A;  . Tubal ligation  1976  . Toe arthroscopy  2005    rt great toe spurs  . Breast surgery  2015    right lumpectomy for breast cancer    Current Outpatient Prescriptions  Medication Sig Dispense Refill  . ALPRAZolam (XANAX) 0.5 MG tablet Take 0.5 mg by mouth at bedtime as needed for anxiety.    Marland Kitchen anastrozole (ARIMIDEX) 1 MG tablet Take 1  tablet (1 mg total) by mouth daily. 90 tablet 3  . denosumab (PROLIA) 60 MG/ML SOLN injection Inject 60 mg into the skin every 6 (six) months. Administer in upper arm, thigh, or abdomen    . fish oil-omega-3 fatty acids 1000 MG capsule Take 1,000 g by mouth daily.      . Multiple Vitamin (MULTIVITAMIN) capsule Take 1 capsule by mouth daily.      . penicillin v potassium (VEETID) 500 MG tablet Take 1 tablet by mouth 4 (four) times daily.  0  . tretinoin (RETIN-A) 0.1 % cream     . venlafaxine XR (EFFEXOR XR) 37.5 MG 24 hr capsule Take 1 capsule (37.5 mg total) by mouth daily with breakfast. 90 capsule 0  . Vitamin D, Ergocalciferol, (DRISDOL) 50000 UNITS CAPS Take 50,000 Units by mouth every 30 (thirty) days.     No current facility-administered medications for this visit.     ALLERGIES: Latex  Family History  Problem Relation Age of Onset  . Diabetes Brother   . Cancer Maternal Grandfather   . Heart failure Mother   . Aneurysm Mother     abdominal    Social History  Social History  . Marital Status: Married    Spouse Name: N/A  . Number of Children: 2  . Years of Education: N/A   Occupational History  . 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 Smoking status: Former Smoker -- 0.50 packs/day for 5 years    Quit date: 11/21/1973  . Smokeless tobacco: Never Used  . Alcohol Use: No  . Drug Use: No  . Sexual Activity:    Partners: Male    Birth Control/ Protection: Post-menopausal, Surgical     Comment: BTSP   Other Topics Concern  . Not on file   Social History Narrative    ROS:  Pertinent items are noted in HPI.  PHYSICAL EXAMINATION:    BP 126/80 mmHg  Pulse 72  Resp 14  Ht 5' 3.5" (1.613 m)  Wt 129 lb (58.514 kg)  BMI 22.49 kg/m2  LMP 04/23/1993    General appearance: alert, cooperative and appears stated age.   Chaperone was present for exam.  ASSESSMENT  Menopausal symptoms controlled on Effexor XR 37.5  mg.  PLAN  Counseled regarding Effexor XR.  Continue current dosage.  Refills for one year.  Cautioned not to stop medication suddenly.  Much better to wean off if needed to do so in the future.  Follow up for annual exam and prn.    An After Visit Summary was printed and given to the patient.  ___15___ minutes face to face time of which over 50% was spent in counseling.

## 2015-02-10 ENCOUNTER — Telehealth: Payer: Self-pay | Admitting: *Deleted

## 2015-02-10 NOTE — Telephone Encounter (Signed)
Per pt dentist is telling her she will not be able to wait 2 months.  Advised pt of risk of osteonecrosis of jaw.  Obtained number and name of dentist - let pt know I would see if  Dr. Lindi Adie can speak with him.  Pt voiced understanding.   LMOVM - Dr. Seward Grater office arrangements for MDs to speak.    Office closed for rest of the week.  Write will leave message for desk RN to contact on Monday 10/24.  [phone]

## 2015-02-10 NOTE — Telephone Encounter (Signed)
Look at previous message 02/08/15. Dentist informed patient that once she is off of penicillin, facial swelling and pain may occur. Patient would like to know if it does go to that extent an extraction may be necessary and would like MD Gudena thoughts. Message sent to MD Lawerance Bach.

## 2015-02-11 NOTE — Telephone Encounter (Signed)
Dr. Lindi Adie called and spoke with patient.

## 2015-02-16 DIAGNOSIS — Z23 Encounter for immunization: Secondary | ICD-10-CM | POA: Diagnosis not present

## 2015-02-17 DIAGNOSIS — L738 Other specified follicular disorders: Secondary | ICD-10-CM | POA: Diagnosis not present

## 2015-02-17 DIAGNOSIS — Z85828 Personal history of other malignant neoplasm of skin: Secondary | ICD-10-CM | POA: Diagnosis not present

## 2015-02-17 DIAGNOSIS — L570 Actinic keratosis: Secondary | ICD-10-CM | POA: Diagnosis not present

## 2015-02-17 DIAGNOSIS — D045 Carcinoma in situ of skin of trunk: Secondary | ICD-10-CM | POA: Diagnosis not present

## 2015-02-17 DIAGNOSIS — D1801 Hemangioma of skin and subcutaneous tissue: Secondary | ICD-10-CM | POA: Diagnosis not present

## 2015-02-17 DIAGNOSIS — L821 Other seborrheic keratosis: Secondary | ICD-10-CM | POA: Diagnosis not present

## 2015-02-17 DIAGNOSIS — D485 Neoplasm of uncertain behavior of skin: Secondary | ICD-10-CM | POA: Diagnosis not present

## 2015-02-17 DIAGNOSIS — L814 Other melanin hyperpigmentation: Secondary | ICD-10-CM | POA: Diagnosis not present

## 2015-03-10 ENCOUNTER — Other Ambulatory Visit: Payer: Self-pay | Admitting: Radiation Oncology

## 2015-04-26 ENCOUNTER — Other Ambulatory Visit: Payer: Self-pay

## 2015-04-26 DIAGNOSIS — C50211 Malignant neoplasm of upper-inner quadrant of right female breast: Secondary | ICD-10-CM

## 2015-04-26 MED ORDER — ANASTROZOLE 1 MG PO TABS: 1.0000 mg | ORAL_TABLET | Freq: Every day | ORAL | Status: DC

## 2015-04-26 NOTE — Telephone Encounter (Signed)
Prescription request for Vanlafaxine (Effexor) received via fax.  The Prescription was originally written 02/09/15 by Dr. Quincy Simmonds, for 90 tabs and 3 Refills.  She should still have refills left. Refusal fax sent to pharmacy//kg

## 2015-04-28 ENCOUNTER — Telehealth: Payer: Self-pay | Admitting: Obstetrics and Gynecology

## 2015-04-28 MED ORDER — VENLAFAXINE HCL ER 37.5 MG PO CP24: 37.5000 mg | ORAL_CAPSULE | Freq: Every day | ORAL | Status: DC

## 2015-04-28 NOTE — Telephone Encounter (Signed)
Patient is requesting a new Rx for Vanlafaxine because she has a new home delivery and they are requesting a new one.

## 2015-04-28 NOTE — Telephone Encounter (Signed)
Spoke with patient. Patient states that her insurance changed at the beginning of the year and she now needs her prescriptions filled through [facility]. Was last seen on 02/09/2015 with Dr.Silva and was given rx for Venlafaxine XR 37.5 mg daily with breakfast #90 3RF. New rx for Venlafaxine XR 37.5 mg #90 2RF sent to [facility]. Patient is agreeable.  Routing to provider for final review. Patient agreeable to disposition. Will close encounter.

## 2015-06-22 ENCOUNTER — Other Ambulatory Visit: Payer: Self-pay | Admitting: *Deleted

## 2015-06-24 ENCOUNTER — Other Ambulatory Visit: Payer: Self-pay | Admitting: *Deleted

## 2015-06-24 ENCOUNTER — Telehealth: Payer: Self-pay | Admitting: Hematology and Oncology

## 2015-06-24 NOTE — Telephone Encounter (Signed)
s.w. pt and advised on new appt d.t...pt ok and aware °

## 2015-08-02 ENCOUNTER — Other Ambulatory Visit: Payer: Medicare PPO

## 2015-08-02 ENCOUNTER — Ambulatory Visit: Payer: Medicare PPO

## 2015-08-02 ENCOUNTER — Ambulatory Visit: Payer: Medicare PPO | Admitting: Hematology and Oncology

## 2015-08-08 ENCOUNTER — Ambulatory Visit: Payer: Medicare PPO

## 2015-08-08 ENCOUNTER — Ambulatory Visit: Payer: Medicare PPO | Admitting: Hematology and Oncology

## 2015-08-08 ENCOUNTER — Other Ambulatory Visit: Payer: Medicare PPO

## 2015-09-05 ENCOUNTER — Ambulatory Visit: Payer: Medicare PPO

## 2015-09-05 ENCOUNTER — Other Ambulatory Visit: Payer: Medicare PPO

## 2015-09-05 ENCOUNTER — Ambulatory Visit: Payer: Medicare PPO | Admitting: Hematology and Oncology

## 2015-09-12 ENCOUNTER — Ambulatory Visit: Payer: Medicare Other

## 2015-09-12 ENCOUNTER — Ambulatory Visit (HOSPITAL_BASED_OUTPATIENT_CLINIC_OR_DEPARTMENT_OTHER): Payer: Medicare Other | Admitting: Hematology and Oncology

## 2015-09-12 ENCOUNTER — Other Ambulatory Visit (HOSPITAL_BASED_OUTPATIENT_CLINIC_OR_DEPARTMENT_OTHER): Payer: Medicare Other

## 2015-09-12 ENCOUNTER — Telehealth: Payer: Self-pay | Admitting: Hematology and Oncology

## 2015-09-12 ENCOUNTER — Encounter: Payer: Self-pay | Admitting: Hematology and Oncology

## 2015-09-12 VITALS — BP 136/88 | HR 80 | Temp 98.4°F | Resp 18 | Wt 129.0 lb

## 2015-09-12 DIAGNOSIS — C50211 Malignant neoplasm of upper-inner quadrant of right female breast: Secondary | ICD-10-CM

## 2015-09-12 DIAGNOSIS — M810 Age-related osteoporosis without current pathological fracture: Secondary | ICD-10-CM | POA: Diagnosis not present

## 2015-09-12 DIAGNOSIS — N951 Menopausal and female climacteric states: Secondary | ICD-10-CM | POA: Diagnosis not present

## 2015-09-12 LAB — COMPREHENSIVE METABOLIC PANEL
ALT: 20 U/L (ref 0–55)
ANION GAP: 8 meq/L (ref 3–11)
AST: 18 U/L (ref 5–34)
Albumin: 4 g/dL (ref 3.5–5.0)
Alkaline Phosphatase: 58 U/L (ref 40–150)
BUN: 18.7 mg/dL (ref 7.0–26.0)
CHLORIDE: 105 meq/L (ref 98–109)
CO2: 29 meq/L (ref 22–29)
CREATININE: 0.8 mg/dL (ref 0.6–1.1)
Calcium: 10 mg/dL (ref 8.4–10.4)
EGFR: 75 mL/min/{1.73_m2} — ABNORMAL LOW (ref >90)
GLUCOSE: 99 mg/dL (ref 70–140)
Potassium: 4.7 mEq/L (ref 3.5–5.1)
SODIUM: 141 meq/L (ref 136–145)
Total Bilirubin: 0.41 mg/dL (ref 0.20–1.20)
Total Protein: 7.6 g/dL (ref 6.4–8.3)

## 2015-09-12 LAB — CBC WITH DIFFERENTIAL/PLATELET
BASO%: 0.8 % (ref 0.0–2.0)
Basophils Absolute: 0.1 10*3/uL (ref 0.0–0.1)
EOS ABS: 0.1 10*3/uL (ref 0.0–0.5)
EOS%: 1 % (ref 0.0–7.0)
HCT: 42.8 % (ref 34.8–46.6)
HEMOGLOBIN: 14.1 g/dL (ref 11.6–15.9)
LYMPH%: 31.8 % (ref 14.0–49.7)
MCH: 34.7 pg — ABNORMAL HIGH (ref 25.1–34.0)
MCHC: 32.9 g/dL (ref 31.5–36.0)
MCV: 105.6 fL — AB (ref 79.5–101.0)
MONO#: 0.4 10*3/uL (ref 0.1–0.9)
MONO%: 6.4 % (ref 0.0–14.0)
NEUT%: 60 % (ref 38.4–76.8)
NEUTROS ABS: 3.6 10*3/uL (ref 1.5–6.5)
Platelets: 199 10*3/uL (ref 145–400)
RBC: 4.05 10*6/uL (ref 3.70–5.45)
RDW: 12.3 % (ref 11.2–14.5)
WBC: 5.9 10*3/uL (ref 3.9–10.3)
lymph#: 1.9 10*3/uL (ref 0.9–3.3)

## 2015-09-12 NOTE — Telephone Encounter (Signed)
appt made and avs printed °

## 2015-09-12 NOTE — Progress Notes (Signed)
Patient Care Team: Asencion Noble, MD as PCP - General (Internal Medicine)  DIAGNOSIS: Breast cancer of upper-inner quadrant of right female breast [facility])   Staging form: Breast, AJCC 7th Edition     Clinical: No stage assigned - Unsigned     Pathologic: Stage IA (T1c, N0, cM0) - Signed by Rulon Eisenmenger, MD on 01/11/2014   SUMMARY OF ONCOLOGIC HISTORY:   Breast cancer of upper-inner quadrant of right female breast ([facility])   12/11/2013 Breast MRI Right breast upper inner quadrant 2.1 x 1.6 x 1.6 cm oval mass no abnormal lymph nodes   12/14/2013 Initial Diagnosis Breast cancer of upper-inner quadrant of right female breast   12/23/2013 Surgery 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 Previously she could not tolerate tamoxifen because of myalgias along with neuropathic symptoms in her extremities. She is tolerating anastrozole extremely well. Does have occasional hot flashes. She does not have the neuropathic symptoms anymore.  REVIEW OF SYSTEMS:   Constitutional: Denies fevers, chills or abnormal weight loss Eyes: Denies blurriness of vision Ears, nose, mouth, throat, and face: Denies mucositis or sore throat Respiratory: Denies cough, dyspnea or wheezes Cardiovascular: Denies palpitation, chest discomfort Gastrointestinal:  Denies nausea, heartburn or change in bowel habits Skin: Denies abnormal skin  rashes Lymphatics: Denies new lymphadenopathy or easy bruising Neurological:Denies numbness, tingling or new weaknesses Behavioral/Psych: Mood is stable, no new changes  Extremities: No lower extremity edema Breast:  denies any pain or lumps or nodules in either breasts All other systems were reviewed with the patient and are negative.  I have reviewed the past medical history, past surgical history, social history and family history with the patient and they are unchanged from previous note.  ALLERGIES:  is allergic to latex.  MEDICATIONS:  Current Outpatient Prescriptions  Medication Sig Dispense Refill  . ALPRAZolam (XANAX) 0.5 MG tablet Take 0.5 mg by mouth at bedtime as needed for anxiety.    Marland Kitchen anastrozole (ARIMIDEX) 1 MG tablet Take 1 tablet (1 mg total) by mouth daily. 90 tablet 2  . denosumab (PROLIA) 60 MG/ML SOLN injection Inject 60 mg into the skin every 6 (six) months. Administer in upper arm, thigh, or abdomen    . fish oil-omega-3 fatty acids 1000 MG capsule Take 1,000 g by mouth daily.      . Multiple Vitamin (MULTIVITAMIN) capsule Take 1 capsule by mouth daily.      . penicillin v potassium (VEETID) 500 MG tablet Take 1 tablet by mouth 4 (four) times daily.  0  . tretinoin (RETIN-A) 0.1 % cream     . venlafaxine XR (EFFEXOR XR) 37.5 MG 24 hr capsule Take 1 capsule (37.5 mg total) by mouth daily with breakfast. 90 capsule 2  . Vitamin D, Ergocalciferol, (DRISDOL) 50000 UNITS CAPS Take 50,000 Units by mouth every 30 (thirty) days.     No current facility-administered medications for this visit.    PHYSICAL EXAMINATION: ECOG PERFORMANCE STATUS: 1 -  Symptomatic but completely ambulatory  Filed Vitals:   09/12/15 1120  BP: 136/88  Pulse: 80  Temp: 98.4 F (36.9 C)  Resp: 18   Filed Weights   09/12/15 1120  Weight: 129 lb (58.514 kg)    GENERAL:alert, no distress and comfortable SKIN: skin color, texture, turgor are normal, no rashes or significant  lesions EYES: normal, Conjunctiva are pink and non-injected, sclera clear OROPHARYNX:no exudate, no erythema and lips, buccal mucosa, and tongue normal  NECK: supple, thyroid normal size, non-tender, without nodularity LYMPH:  no palpable lymphadenopathy in the cervical, axillary or inguinal LUNGS: clear to auscultation and percussion with normal breathing effort HEART: regular rate & rhythm and no murmurs and no lower extremity edema ABDOMEN:abdomen soft, non-tender and normal bowel sounds MUSCULOSKELETAL:no cyanosis of digits and no clubbing  NEURO: alert & oriented x 3 with fluent speech, no focal motor/sensory deficits EXTREMITIES: No lower extremity edema  LABORATORY DATA:  I have reviewed the data as listed   Chemistry      Component Value Date/Time   NA 142 12/22/2013 1430   K 3.8 12/22/2013 1430   CL 103 12/22/2013 1430   CO2 27 12/22/2013 1430   BUN 11 12/22/2013 1430   CREATININE 0.72 12/22/2013 1430      Component Value Date/Time   CALCIUM 9.0 12/22/2013 1430       Lab Results  Component Value Date   WBC 5.8 12/22/2013   HGB 14.7 12/22/2013   HCT 42.0 12/22/2013   MCV 99.8 12/22/2013   PLT 233 12/22/2013   NEUTROABS 3.5 12/22/2013     ASSESSMENT & PLAN:  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  Anastrozole toxicities: Does not have any further neuropathy/myalgias related to prior tamoxifen therapy Occasional hot flashes well controlled with Effexor  Osteoporosis: Patient had T score of -2.5 and -2.6. On Prolia every 6 months, Patient needs to have an implant  placed in the jaw. We will postpone her prolia injection to August. Patient eats yogurt and drinks 3 glasses of milk every day. She is planning on not taking any calcium.  Return to clinic in 8 months for follow-up to coordinate with her prolia injection   No orders of the defined types were placed in this encounter.   The patient has a good understanding of the overall plan. she agrees with it. she will call with any problems that may develop before the next visit here.   Rulon Eisenmenger, MD 09/12/2015

## 2015-09-12 NOTE — Assessment & Plan Note (Signed)
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  Tamoxifen toxicities: 1. Severe hot flashes 2. Progressively worsening neuropathy: This neuropathy improved after getting a treatment break from tamoxifen for 3 months.  Treatment plan: Anastrozole 1 mg by mouth daily started 01/24/2015. Osteoporosis: Patient had T score of -2.5 and -2.6. On Prolia every 6 months   Return to clinic in 6 months for follow-up or sooner if she cannot tolerate anastrozole.

## 2015-09-24 ENCOUNTER — Other Ambulatory Visit: Payer: Self-pay | Admitting: Obstetrics and Gynecology

## 2015-09-26 NOTE — Telephone Encounter (Signed)
Medication refill request: effexor  Last AEX:  12/15/14  Next AEX: 03/09/16 Last MMG (if hormonal medication request): 12/01/14 BIRADS2:benign  Refill authorized: 04/28/15 #[id number]. Today please advise.

## 2015-11-08 ENCOUNTER — Telehealth: Payer: Self-pay | Admitting: *Deleted

## 2015-11-08 DIAGNOSIS — M810 Age-related osteoporosis without current pathological fracture: Secondary | ICD-10-CM

## 2015-11-08 NOTE — Telephone Encounter (Signed)
Order for BMD placed electronically to the [facility]. Patient has been notified and will contact the [facility] to schedule her BMD appointment.  Routing to provider for final review. Patient agreeable to disposition. Will close encounter.

## 2015-11-08 NOTE — Telephone Encounter (Signed)
Patient needs order for Bone Density sent to the [facility]

## 2015-11-09 ENCOUNTER — Other Ambulatory Visit: Payer: Self-pay | Admitting: Obstetrics and Gynecology

## 2015-11-09 DIAGNOSIS — Z853 Personal history of malignant neoplasm of breast: Secondary | ICD-10-CM

## 2015-12-02 ENCOUNTER — Other Ambulatory Visit: Payer: Medicare Other

## 2015-12-02 ENCOUNTER — Ambulatory Visit
Admission: RE | Admit: 2015-12-02 | Discharge: 2015-12-02 | Disposition: A | Discharge status: Home / Self Care | Payer: Medicare Other | Source: Ambulatory Visit | Attending: Obstetrics and Gynecology | Admitting: Obstetrics and Gynecology

## 2015-12-02 DIAGNOSIS — Z853 Personal history of malignant neoplasm of breast: Secondary | ICD-10-CM

## 2015-12-06 ENCOUNTER — Ambulatory Visit
Admission: RE | Admit: 2015-12-06 | Discharge: 2015-12-06 | Disposition: A | Discharge status: Home / Self Care | Payer: Medicare Other | Source: Ambulatory Visit | Attending: Obstetrics and Gynecology | Admitting: Obstetrics and Gynecology

## 2015-12-06 DIAGNOSIS — M810 Age-related osteoporosis without current pathological fracture: Secondary | ICD-10-CM

## 2015-12-13 ENCOUNTER — Ambulatory Visit (HOSPITAL_BASED_OUTPATIENT_CLINIC_OR_DEPARTMENT_OTHER): Payer: Medicare Other

## 2015-12-13 ENCOUNTER — Other Ambulatory Visit: Payer: Self-pay

## 2015-12-13 VITALS — BP 133/85 | HR 88 | Temp 98.1°F | Resp 18

## 2015-12-13 DIAGNOSIS — M810 Age-related osteoporosis without current pathological fracture: Secondary | ICD-10-CM

## 2015-12-13 DIAGNOSIS — C50211 Malignant neoplasm of upper-inner quadrant of right female breast: Secondary | ICD-10-CM

## 2015-12-13 LAB — COMPREHENSIVE METABOLIC PANEL
ALBUMIN: 3.5 g/dL (ref 3.5–5.0)
ALK PHOS: 67 U/L (ref 40–150)
ALT: 23 U/L (ref 0–55)
AST: 20 U/L (ref 5–34)
Anion Gap: 6 mEq/L (ref 3–11)
BILIRUBIN TOTAL: 0.33 mg/dL (ref 0.20–1.20)
BUN: 18.7 mg/dL (ref 7.0–26.0)
CO2: 29 mEq/L (ref 22–29)
Calcium: 9.5 mg/dL (ref 8.4–10.4)
Chloride: 105 mEq/L (ref 98–109)
Creatinine: 0.7 mg/dL (ref 0.6–1.1)
EGFR: 82 mL/min/{1.73_m2} — AB (ref >90)
GLUCOSE: 94 mg/dL (ref 70–140)
Potassium: 4.4 mEq/L (ref 3.5–5.1)
SODIUM: 140 meq/L (ref 136–145)
TOTAL PROTEIN: 7 g/dL (ref 6.4–8.3)

## 2015-12-13 LAB — CBC WITH DIFFERENTIAL/PLATELET
BASO%: 0.6 % (ref 0.0–2.0)
BASOS ABS: 0 10*3/uL (ref 0.0–0.1)
EOS ABS: 0.1 10*3/uL (ref 0.0–0.5)
EOS%: 1.3 % (ref 0.0–7.0)
HCT: 40.8 % (ref 34.8–46.6)
HEMOGLOBIN: 13.4 g/dL (ref 11.6–15.9)
LYMPH%: 36.4 % (ref 14.0–49.7)
MCH: 34.6 pg — AB (ref 25.1–34.0)
MCHC: 32.8 g/dL (ref 31.5–36.0)
MCV: 105.5 fL — AB (ref 79.5–101.0)
MONO#: 0.3 10*3/uL (ref 0.1–0.9)
MONO%: 7.1 % (ref 0.0–14.0)
NEUT#: 2.6 10*3/uL (ref 1.5–6.5)
NEUT%: 54.6 % (ref 38.4–76.8)
Platelets: 204 10*3/uL (ref 145–400)
RBC: 3.87 10*6/uL (ref 3.70–5.45)
RDW: 12.4 % (ref 11.2–14.5)
WBC: 4.7 10*3/uL (ref 3.9–10.3)
lymph#: 1.7 10*3/uL (ref 0.9–3.3)

## 2015-12-13 MED ORDER — DENOSUMAB 60 MG/ML ~~LOC~~ SOLN
60.0000 mg | Freq: Once | SUBCUTANEOUS | Status: AC
  Administered 2015-12-13: 60 mg via SUBCUTANEOUS
  Filled 2015-12-13: qty 1

## 2015-12-13 NOTE — Patient Instructions (Signed)
Denosumab injection  What is this medicine?  DENOSUMAB (den oh sue mab) slows bone breakdown. Prolia is used to treat osteoporosis in women after menopause and in men. Xgeva is used to prevent bone fractures and other bone problems caused by cancer bone metastases. Xgeva is also used to treat giant cell tumor of the bone.  This medicine may be used for other purposes; ask your health care provider or pharmacist if you have questions.  What should I tell my health care provider before I take this medicine?  They need to know if you have any of these conditions:  -dental disease  -eczema  -infection or history of infections  -kidney disease or on dialysis  -low blood calcium or vitamin D  -malabsorption syndrome  -scheduled to have surgery or tooth extraction  -taking medicine that contains denosumab  -thyroid or parathyroid disease  -an unusual reaction to denosumab, other medicines, foods, dyes, or preservatives  -pregnant or trying to get pregnant  -breast-feeding  How should I use this medicine?  This medicine is for injection under the skin. It is given by a health care professional in a hospital or clinic setting.  If you are getting Prolia, a special MedGuide will be given to you by the pharmacist with each prescription and refill. Be sure to read this information carefully each time.  For Prolia, talk to your pediatrician regarding the use of this medicine in children. Special care may be needed. For Xgeva, talk to your pediatrician regarding the use of this medicine in children. While this drug may be prescribed for children as young as 13 years for selected conditions, precautions do apply.  Overdosage: If you think you have taken too much of this medicine contact a poison control center or emergency room at once.  NOTE: This medicine is only for you. Do not share this medicine with others.  What if I miss a dose?  It is important not to miss your dose. Call your doctor or health care professional if you are  unable to keep an appointment.  What may interact with this medicine?  Do not take this medicine with any of the following medications:  -other medicines containing denosumab  This medicine may also interact with the following medications:  -medicines that suppress the immune system  -medicines that treat cancer  -steroid medicines like prednisone or cortisone  This list may not describe all possible interactions. Give your health care provider a list of all the medicines, herbs, non-prescription drugs, or dietary supplements you use. Also tell them if you smoke, drink alcohol, or use illegal drugs. Some items may interact with your medicine.  What should I watch for while using this medicine?  Visit your doctor or health care professional for regular checks on your progress. Your doctor or health care professional may order blood tests and other tests to see how you are doing.  Call your doctor or health care professional if you get a cold or other infection while receiving this medicine. Do not treat yourself. This medicine may decrease your body's ability to fight infection.  You should make sure you get enough calcium and vitamin D while you are taking this medicine, unless your doctor tells you not to. Discuss the foods you eat and the vitamins you take with your health care professional.  See your dentist regularly. Brush and floss your teeth as directed. Before you have any dental work done, tell your dentist you are receiving this medicine.  Do   not become pregnant while taking this medicine or for 5 months after stopping it. Women should inform their doctor if they wish to become pregnant or think they might be pregnant. There is a potential for serious side effects to an unborn child. Talk to your health care professional or pharmacist for more information.  What side effects may I notice from receiving this medicine?  Side effects that you should report to your doctor or health care professional as soon as  possible:  -allergic reactions like skin rash, itching or hives, swelling of the face, lips, or tongue  -breathing problems  -chest pain  -fast, irregular heartbeat  -feeling faint or lightheaded, falls  -fever, chills, or any other sign of infection  -muscle spasms, tightening, or twitches  -numbness or tingling  -skin blisters or bumps, or is dry, peels, or red  -slow healing or unexplained pain in the mouth or jaw  -unusual bleeding or bruising  Side effects that usually do not require medical attention (Report these to your doctor or health care professional if they continue or are bothersome.):  -muscle pain  -stomach upset, gas  This list may not describe all possible side effects. Call your doctor for medical advice about side effects. You may report side effects to FDA at 1-800-FDA-1088.  Where should I keep my medicine?  This medicine is only given in a clinic, doctor's office, or other health care setting and will not be stored at home.  NOTE: This sheet is a summary. It may not cover all possible information. If you have questions about this medicine, talk to your doctor, pharmacist, or health care provider.      2016, Elsevier/Gold Standard. (2011-10-08 12:37:47)

## 2015-12-16 ENCOUNTER — Encounter: Payer: Self-pay | Admitting: Obstetrics and Gynecology

## 2015-12-16 ENCOUNTER — Ambulatory Visit (INDEPENDENT_AMBULATORY_CARE_PROVIDER_SITE_OTHER): Payer: Medicare Other | Admitting: Obstetrics and Gynecology

## 2015-12-16 VITALS — BP 124/80 | HR 76 | Resp 14 | Ht 63.5 in | Wt 130.6 lb

## 2015-12-16 DIAGNOSIS — K649 Unspecified hemorrhoids: Secondary | ICD-10-CM | POA: Diagnosis not present

## 2015-12-16 DIAGNOSIS — Z01419 Encounter for gynecological examination (general) (routine) without abnormal findings: Secondary | ICD-10-CM | POA: Diagnosis not present

## 2015-12-16 MED ORDER — VENLAFAXINE HCL ER 37.5 MG PO CP24: ORAL_CAPSULE | ORAL | 3 refills | Status: DC

## 2015-12-16 NOTE — Patient Instructions (Signed)

## 2015-12-16 NOTE — Progress Notes (Signed)
70 y.o. G2P2 Married Caucasian female here for annual exam.    Has osteoporosis.  Just received Prolia.  Worsening density of the hip.   May be due to Arimidex. Used Reclast in the past.   On Effexor to control hot flashes.  Working well.   PCP:   Asencion Noble, MD  Patient's last menstrual period was 04/23/1993.           Sexually active: No. female The current method of family planning is tubal ligation/postmenopausal.    Exercising: Yes.    Line dancing, cardio, weights and yoga Smoker:  no  Health Maintenance: Pap:  12-15-14 Neg History of abnormal Pap:  no MMG:  8-11-17Diag.Bil/ Density C/Rt.Br.lumpectomy stable/[facility]ads2:[facility] Colonoscopy:  2013 normal with Dr. Brett Canales due 2023 BMD:  12-06-15  Result  Osteoporosis TDaP:  PCP Gardasil:   N/A HIV:  Will do with PCP if not done.  Hep C:  Will do with PCP if not done.  Screening Labs:  Hb today: PCP, Urine today: not done   reports that she quit smoking about 42 years ago. She has a 2.50 pack-year smoking history. She has never used smokeless tobacco. She reports that she does not drink alcohol or use drugs.  Past Medical History:  Diagnosis Date  . Breast cancer, right breast [facility]) August 2015   lumpectomy and radiation therapy  . Fibroids 12/2008   history of  . Osteoporosis   . Skin cancer    basal cell    Past Surgical History:  Procedure Laterality Date  . APPENDECTOMY    . BREAST SURGERY  2015   right lumpectomy for breast cancer  . COLONOSCOPY  10 years ago   dr Laural Golden  . COLONOSCOPY  12/07/2010   Procedure: COLONOSCOPY;  Surgeon: Rogene Houston, MD;  Location: [facility];  Service: Endoscopy;  Laterality: N/A;  . TOE ARTHROSCOPY  2005   rt great toe spurs  . TONSILLECTOMY    . TUBAL LIGATION  1976  . VAGINAL DELIVERY     times 2    Current Outpatient Prescriptions  Medication Sig Dispense Refill  . ALPRAZolam (XANAX) 1 MG tablet Take 1 tablet by mouth as needed.    Marland Kitchen anastrozole  (ARIMIDEX) 1 MG tablet Take 1 tablet (1 mg total) by mouth daily. 90 tablet 2  . denosumab (PROLIA) 60 MG/ML SOLN injection Inject 60 mg into the skin every 6 (six) months. Administer in upper arm, thigh, or abdomen    . fish oil-omega-3 fatty acids 1000 MG capsule Take 1,000 g by mouth daily.      . Multiple Vitamin (MULTIVITAMIN) capsule Take 1 capsule by mouth daily.      . penicillin v potassium (VEETID) 500 MG tablet Take 1 tablet by mouth 4 (four) times daily.  0  . tretinoin (RETIN-A) 0.1 % cream     . venlafaxine XR (EFFEXOR-XR) 37.5 MG 24 hr capsule Take 1 capsule by mouth  daily with breakfast 90 capsule 1  . Vitamin D, Ergocalciferol, (DRISDOL) 50000 UNITS CAPS Take 50,000 Units by mouth every 30 (thirty) days.     No current facility-administered medications for this visit.     Family History  Problem Relation Age of Onset  . Diabetes Brother   . Heart failure Mother   . Aneurysm Mother     abdominal  . Cancer Maternal Grandfather     ROS:  Pertinent items are noted in HPI.  Otherwise, a comprehensive ROS was negative.  Exam:   BP 124/80 (BP Location: Right Arm, Patient Position: Sitting, Cuff Size: Normal)   Pulse 76   Resp 14   Ht 5' 3.5" (1.613 m)   Wt 130 lb 9.6 oz (59.2 kg)   LMP 04/23/1993   BMI 22.77 kg/m     General appearance: alert, cooperative and appears stated age Head: Normocephalic, without obvious abnormality, atraumatic Neck: no adenopathy, supple, symmetrical, trachea midline and thyroid normal to inspection and palpation Lungs: clear to auscultation bilaterally Breasts: normal appearance, no masses or tenderness, No nipple retraction or dimpling, No nipple discharge or bleeding, No axillary or supraclavicular adenopathy Heart: regular rate and rhythm Abdomen: soft, non-tender; no masses, no organomegaly Extremities: extremities normal, atraumatic, no cyanosis or edema Skin: Skin color, texture, turgor normal. No rashes or lesions Lymph nodes:  Cervical, supraclavicular, and axillary nodes normal. No abnormal inguinal nodes palpated Neurologic: Grossly normal  Pelvic: External genitalia:  no lesions              Urethra:  normal appearing urethra with no masses, tenderness or lesions              Bartholins and Skenes: normal                 Vagina: normal appearing vagina with normal color and discharge, no lesions              Cervix: no lesions              Pap taken: No. Bimanual Exam:  Uterus:  normal size, contour, position, consistency, mobility, non-tender              Adnexa: no mass, fullness, tenderness              Rectal exam: Yes.  .  Confirms.              Anus:  normal sphincter tone, multiple hemorrhoids and large skin tag versus hemorrhoid.  Chaperone was present for exam.  Assessment:   Well woman visit with normal exam. Right breast cancer. Current Arimidex tx.  Osteoporosis.  On Prolia. Menopausal symptoms treated with Effexor.    Plan: Yearly mammogram recommended after age 70.  Recommended self breast exam.  Pap and HR HPV as above. Discussed Calcium, Vitamin D, regular exercise program including cardiovascular and weight bearing exercise. Fall prevention discussed.  Discussed recent bone density.  I am asking for the [facility] to reread her BMD report.  Continue Effexor XR 37.5 mg daily.  Referral to Dr. Barry Dienes for hemorrhoids. Follow up annually and prn.       After visit summary provided.

## 2016-01-03 ENCOUNTER — Encounter: Payer: Self-pay | Admitting: Hematology and Oncology

## 2016-01-06 ENCOUNTER — Ambulatory Visit: Payer: Medicare PPO | Admitting: Obstetrics and Gynecology

## 2016-01-23 ENCOUNTER — Ambulatory Visit (HOSPITAL_COMMUNITY)
Admission: RE | Admit: 2016-01-23 | Discharge: 2016-01-23 | Disposition: A | Discharge status: Home / Self Care | Payer: Medicare Other | Source: Ambulatory Visit | Attending: Internal Medicine | Admitting: Internal Medicine

## 2016-01-23 ENCOUNTER — Other Ambulatory Visit (HOSPITAL_COMMUNITY): Payer: Self-pay | Admitting: Internal Medicine

## 2016-01-23 DIAGNOSIS — M25561 Pain in right knee: Secondary | ICD-10-CM | POA: Diagnosis not present

## 2016-01-23 DIAGNOSIS — R52 Pain, unspecified: Secondary | ICD-10-CM

## 2016-03-09 ENCOUNTER — Ambulatory Visit: Payer: Medicare PPO | Admitting: Obstetrics and Gynecology

## 2016-04-16 IMAGING — MR MR BREAST BILATERAL W WO CONTRAST
8 of 13 series · 31 of 48 positions shown · IV contrast (11ml Multihance)
Comparison: Mammogram from the [REDACTED] on 12/04/2013 and earlier

CLINICAL DATA: Recent ultrasound-guided core biopsy of mass in the
1 o'clock location of the right breast shows invasive ductal
carcinoma.

LABS:  BUN and creatinine were obtained on site at [HOSPITAL]
[HOSPITAL].
Results:  BUN 14 mg/dL,  Creatinine 0.8 mg/dL.
EXAM:
BILATERAL BREAST MRI WITH AND WITHOUT CONTRAST
TECHNIQUE: Multiplanar, multisequence MR images of both breasts were obtained
prior to and following the intravenous administration of 11ml of
MultiHance.

[Series 2: T2 · axial · 3.0mm · 0.94mm/px · z∈[-72,+90]mm · 3 of 54 slices shown]
[im 1/54]
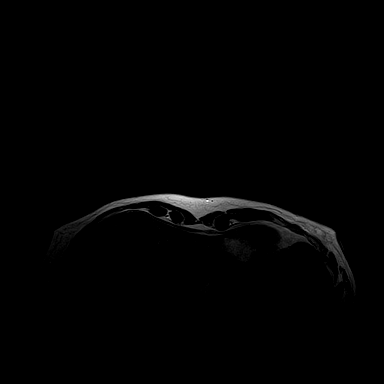
[im 27/54]
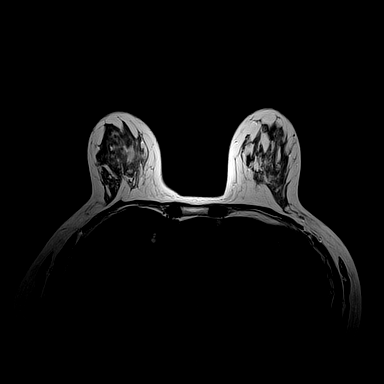
[im 54/54]
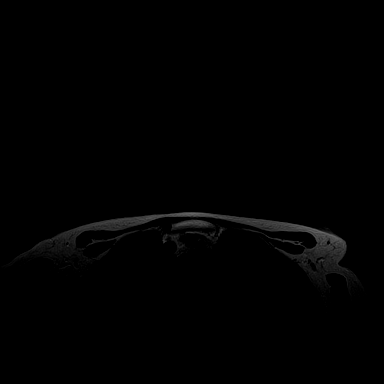

[Series 3: t2_tirm_tra ipat (a-p) · axial · 3.0mm · 0.70mm/px · z∈[-72,+90]mm · 2 of 55 slices shown]
[im 1/55]
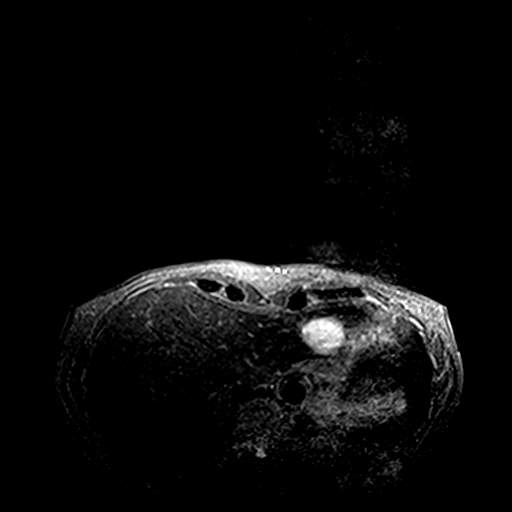
[im 55/55]
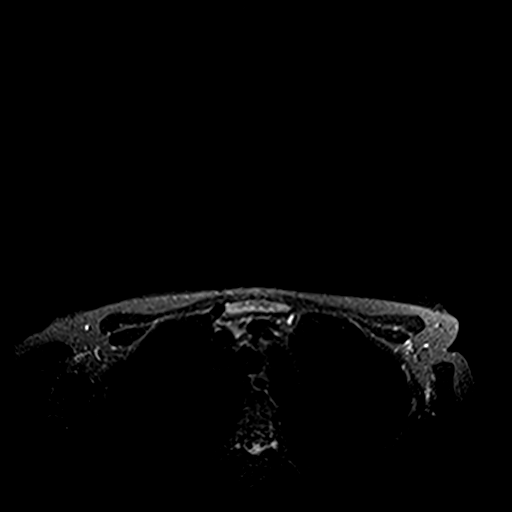

[Series 4: fl3d pre-cm no · axial · non-contrast · 1.2mm · 0.94mm/px · z∈[-76,+95]mm · 5 of 144 slices shown]
[im 1/144]
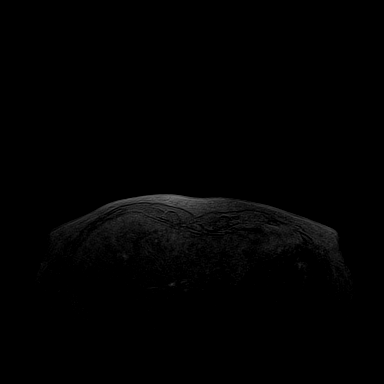
[im 36/144]
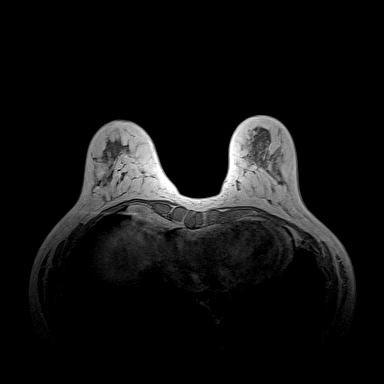
[im 72/144]
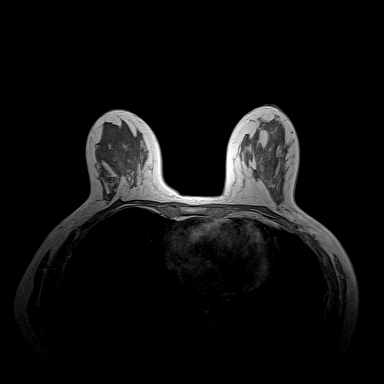
[im 108/144]
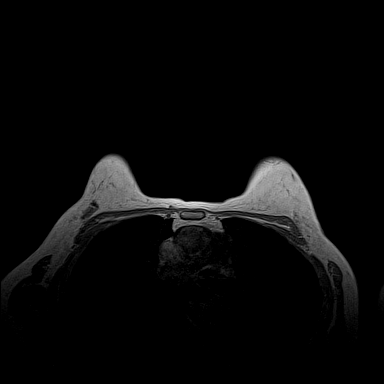
[im 144/144]
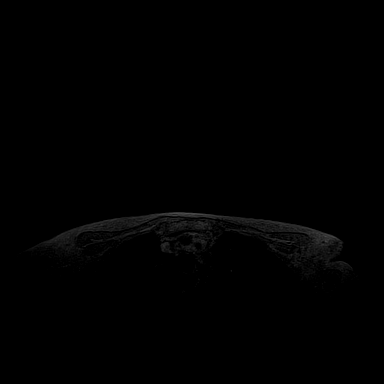

[Series 5: fl3d pre-cm · axial · non-contrast · 1.2mm · 0.94mm/px · z∈[-76,+95]mm · 5 of 144 slices shown]
[im 1/144]
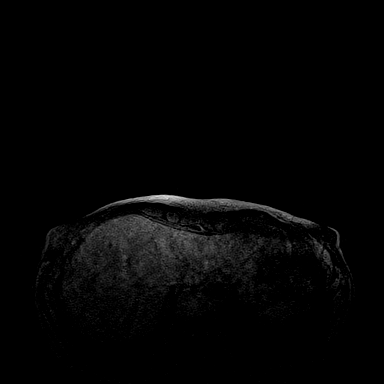
[im 36/144]
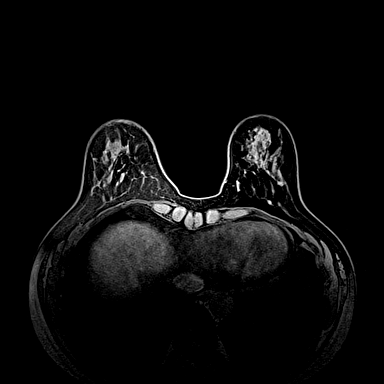
[im 72/144]
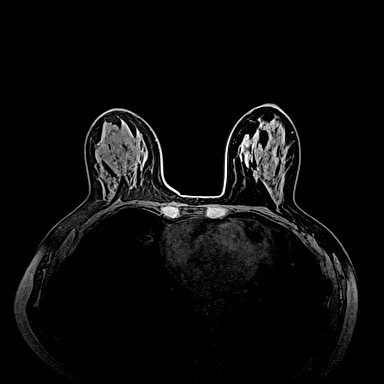
[im 108/144]
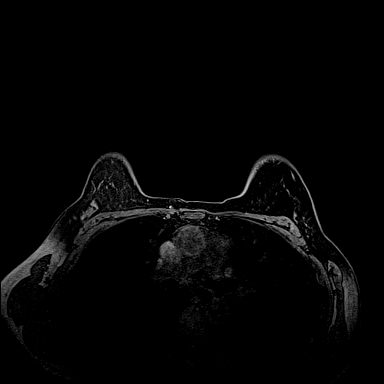
[im 144/144]
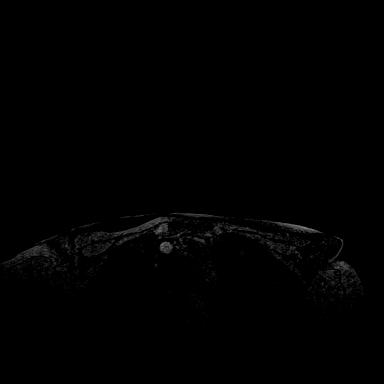

[Series 6: fl3d post-cm 20 · axial · 1.2mm · 0.94mm/px · z∈[-76,+95]mm · 5 of 144 slices shown (1 of 3)]
[im 1/144]
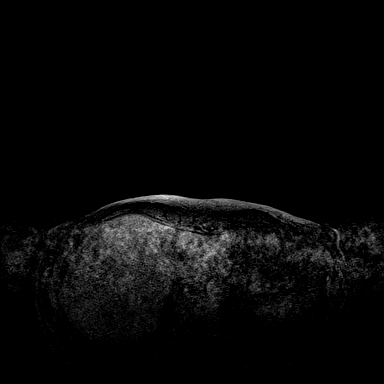
[im 36/144]
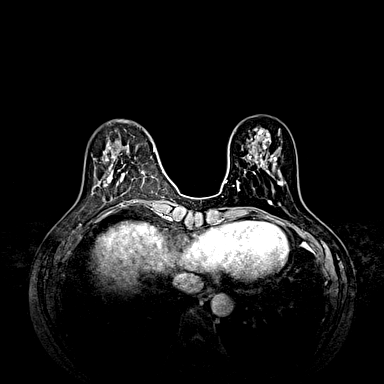
[im 72/144]
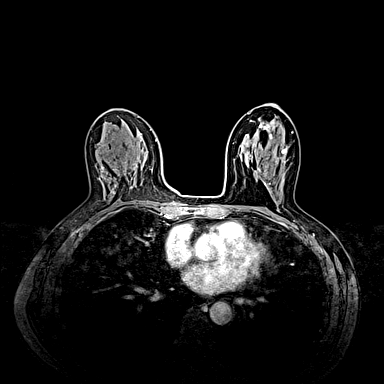
[im 108/144]
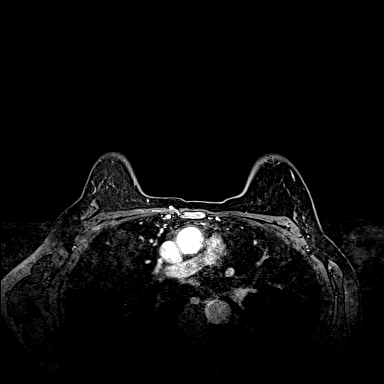
[im 144/144]
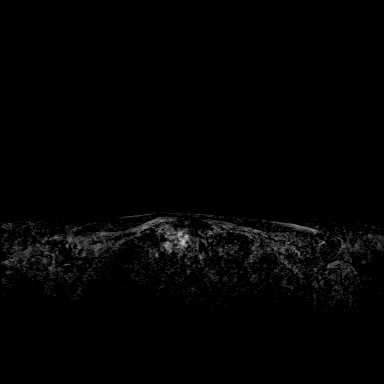

[Series 7: fl3d post-cm 20 · axial · 1.2mm · 0.94mm/px · z∈[-76,+95]mm · 5 of 144 slices shown (2 of 3)]
[im 1/144]
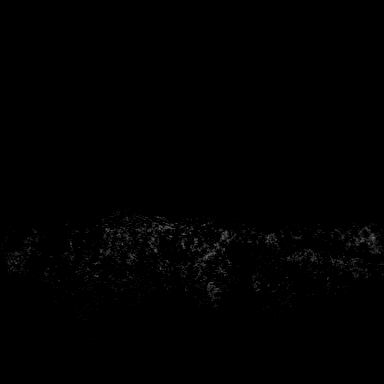
[im 36/144]
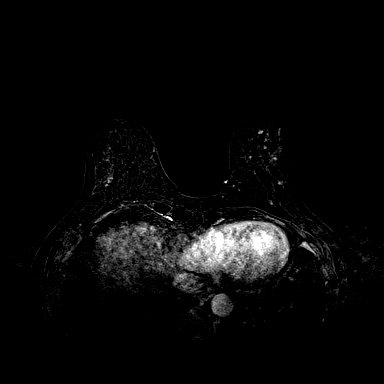
[im 72/144]
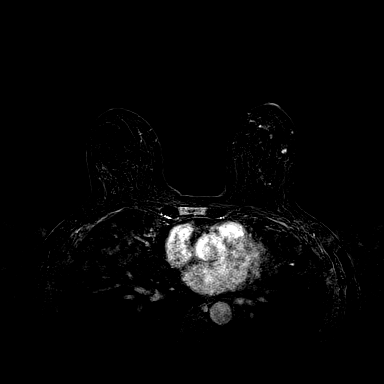
[im 108/144]
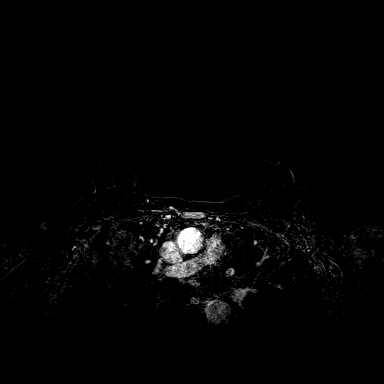
[im 144/144]
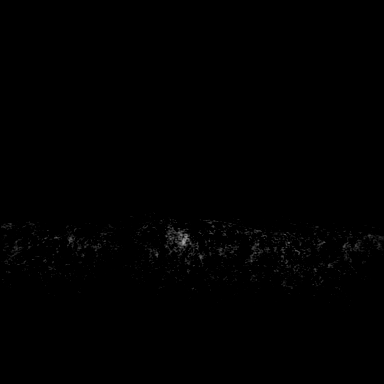

[Series 8: fl3d post-cm 20 · axial · 172.8mm · 0.94mm/px · 1 of 1 slices shown (3 of 3)]
[im 1/1]
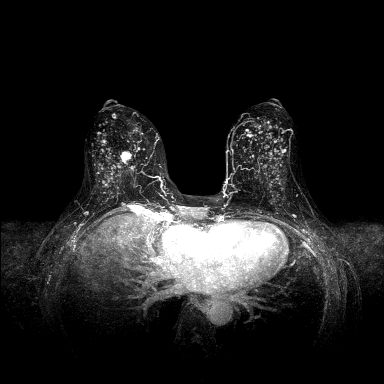

[Series 9: fl3d post-cm 3min · axial · 1.2mm · 0.94mm/px · z∈[-76,+95]mm · 5 of 144 slices shown]
[im 1/144]
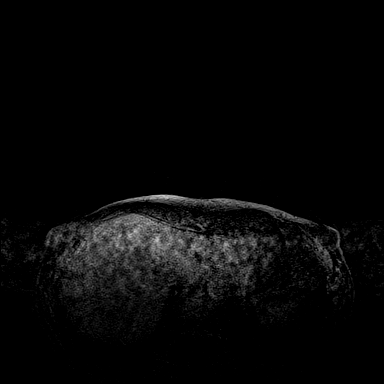
[im 36/144]
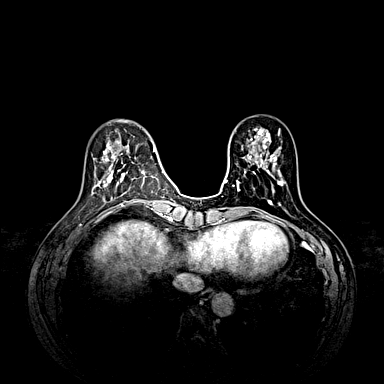
[im 72/144]
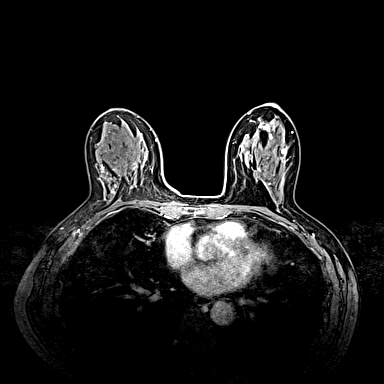
[im 108/144]
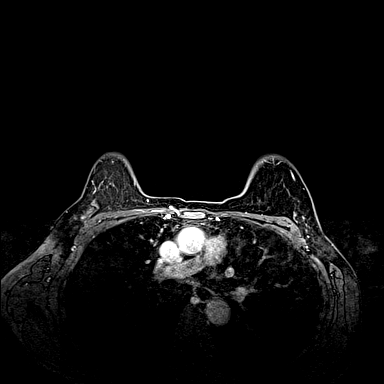
[im 144/144]
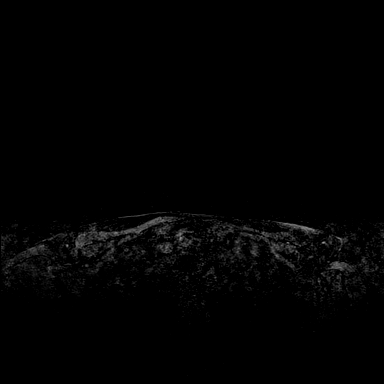

[31 of 48 positions shown; findings below may reference images not displayed]

THREE-DIMENSIONAL MR IMAGE RENDERING ON INDEPENDENT WORKSTATION:

Three-dimensional MR images were rendered by post-processing of the
original MR data on an independent workstation. The
three-dimensional MR images were interpreted, and findings are
reported in the following complete MRI report for this study. Three
dimensional images were evaluated at the independent [employer]
workstation
FINDINGS: Breast composition: d.  Extreme fibroglandular tissue.

Background parenchymal enhancement: Moderate

Right breast: Within the upper inner quadrant of the right breast
there is an enhancing oval mass which measures 2.1 x 1.6 x 1.6 cm.
Mass demonstrates rapid wash-in and washout type enhancement
kinetics and contains marker clip artifact. Findings are consistent
with known malignancy. No other suspicious enhancement identified on
the right. There are scattered foci of enhancement throughout the
breast.

Left breast: No mass or abnormal enhancement. There are scattered
foci of enhancement throughout the breast.

Lymph nodes: No abnormal appearing lymph nodes.

Ancillary findings: Within the right hepatic lobe there is a 3 cm
lesion demonstrating typical enhancement characteristics for a
hemangioma. Within the left hepatic lobe there is a second 3 cm not
fully characterized on T1 weighted images.
IMPRESSION: 1. Known malignancy within the upper inner quadrant of the right
breast.
2. No evidence for additional ipsilateral or contralateral disease.
3. Two 3 cm hepatic lesions. One is typical for hemangioma. The
second is not characterized.

RECOMMENDATION:
1. Treatment plan for known right breast malignancy.
2. Recommend ultrasound correlation for the liver lesions.

BI-RADS CATEGORY  6: Known biopsy-proven malignancy.

## 2016-04-26 IMAGING — US US ABDOMEN COMPLETE
1 series · 13 of 25 positions shown · non-contrast
Comparison: Breast MRI 06/13/2013.

CLINICAL DATA: Recent diagnosis of breast cancer.

EXAM:
ULTRASOUND ABDOMEN COMPLETE

[Series 1: us abdomen complete · 0.20mm/px · 13 of 86 slices shown]
[im 1/86]
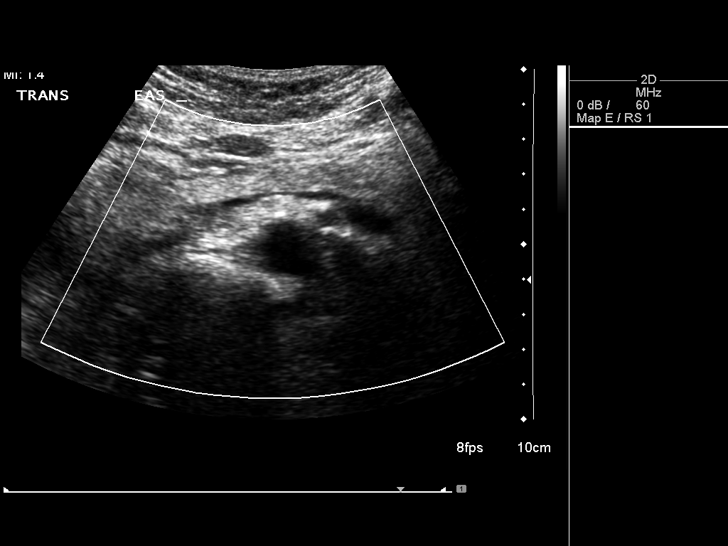
[im 8/86]
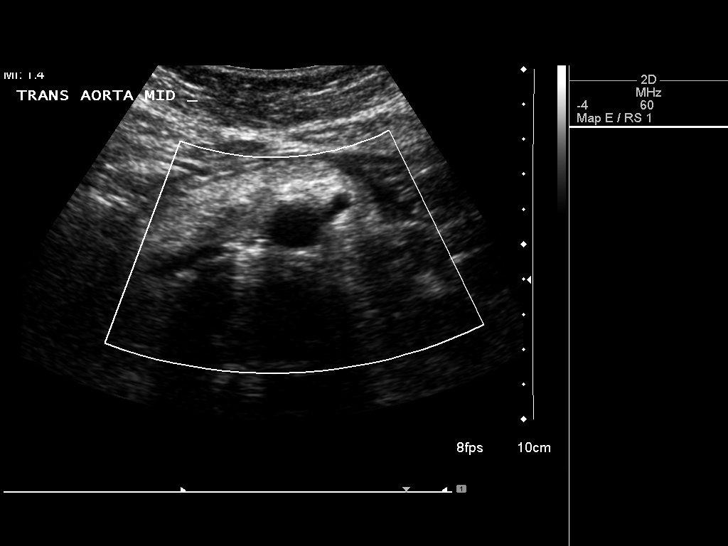
[im 15/86]
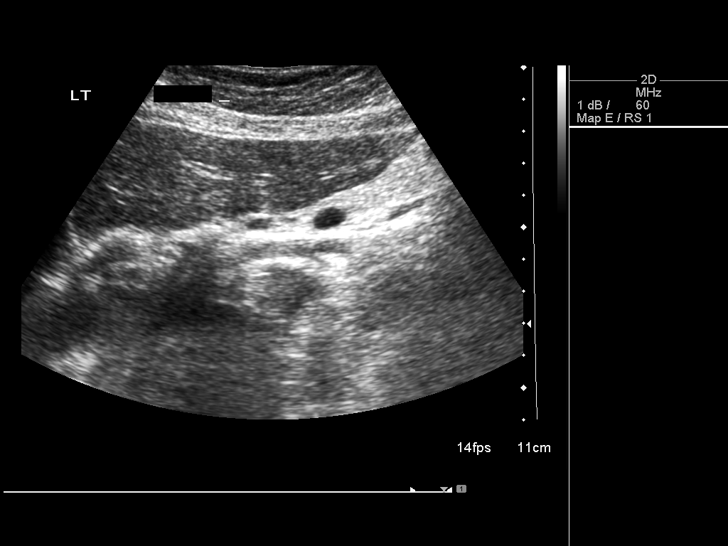
[im 22/86]
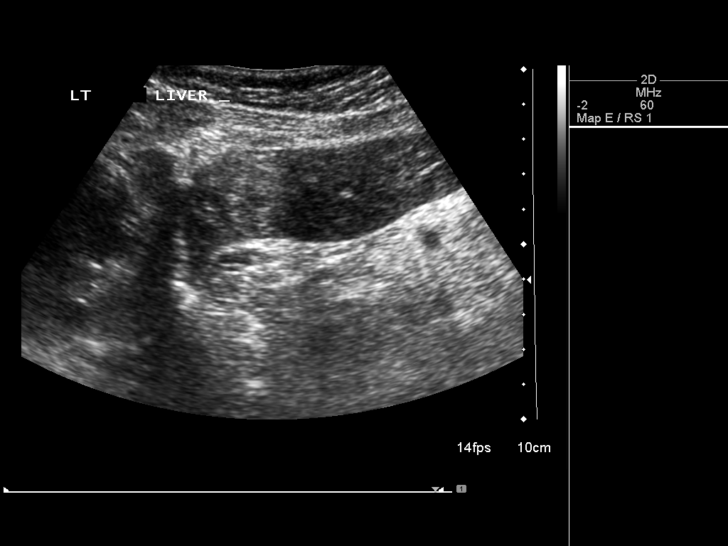
[im 29/86]
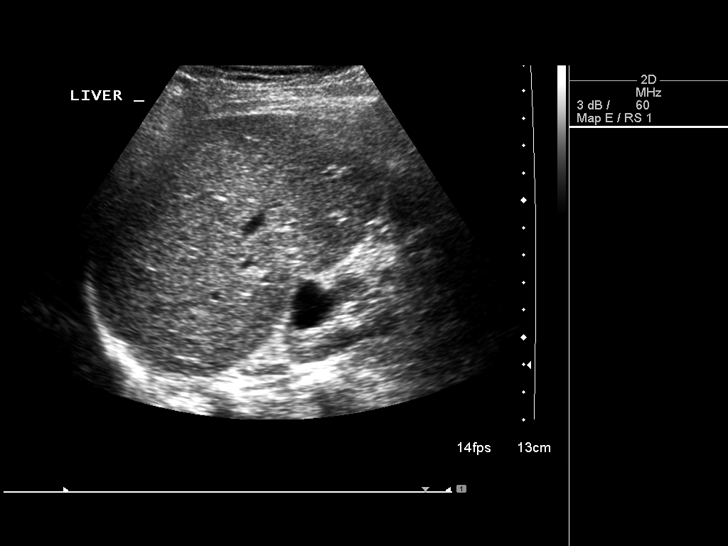
[im 36/86]
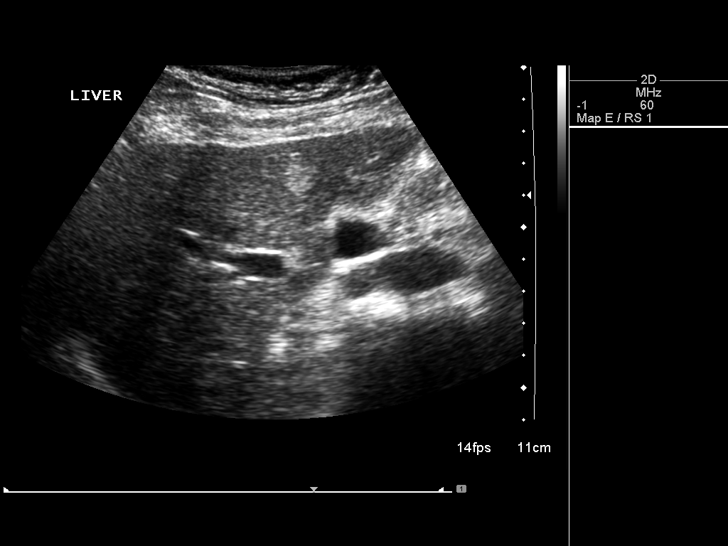
[im 43/86]
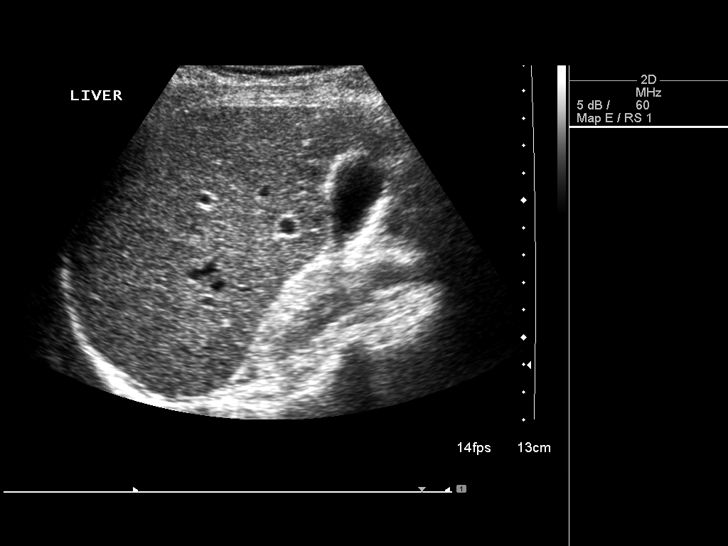
[im 50/86]
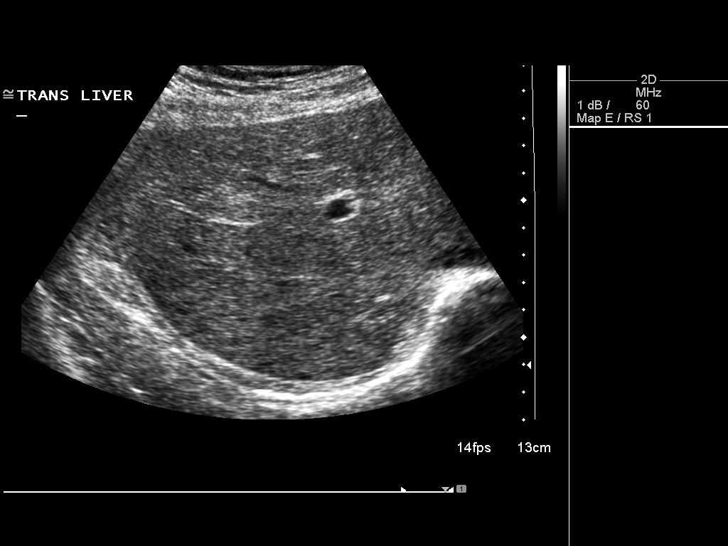
[im 57/86]
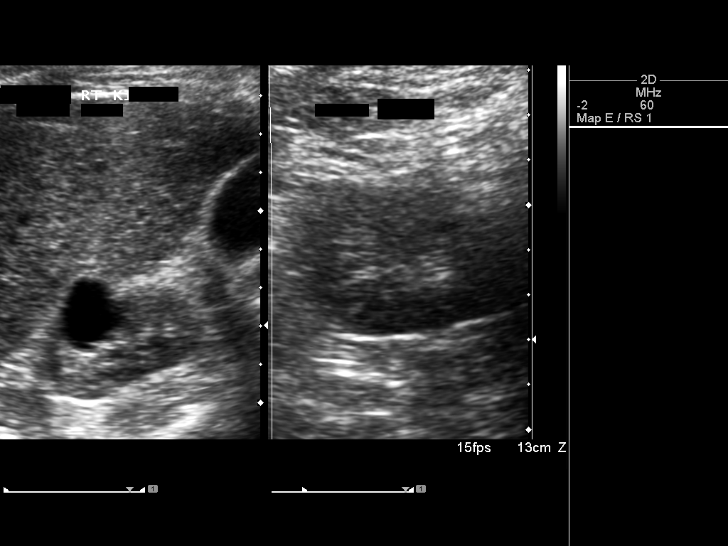
[im 64/86]
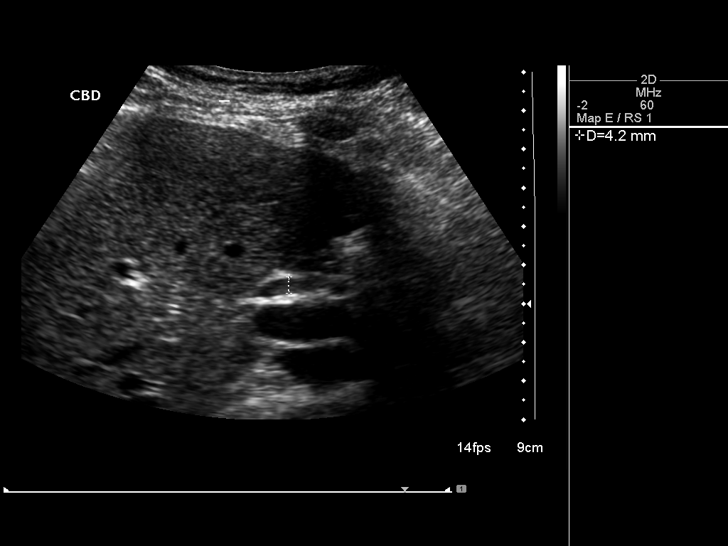
[im 71/86]
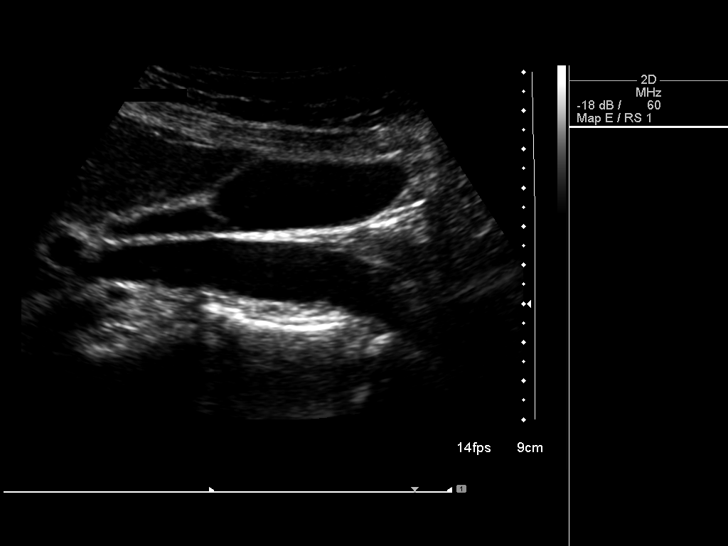
[im 78/86]
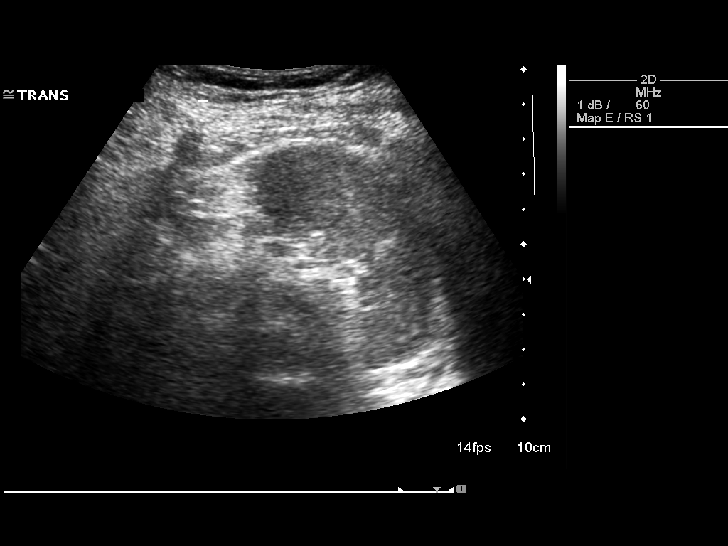
[im 86/86]
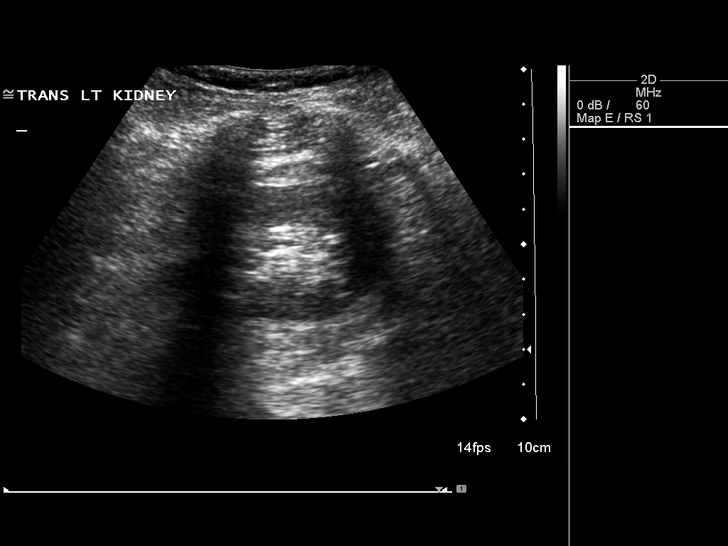

[13 of 25 positions shown; findings below may reference images not displayed]

FINDINGS: Gallbladder:

No gallstones or wall thickening visualized. No sonographic Murphy
sign noted.

Common bile duct:

Diameter: Normal at 4.2 mm

Liver:

There are 4 well-circumscribed hyperechoic lesions within the liver.
In addition to hyperechogenicity, the lesions demonstrate through
transmission. Two within the left hepatic lobe and two in the right
hepatic lobe. The largest measures 4 cm in the left lower hepatic
lobe. Largest right hepatic lobe measures 3 cm. These lesions have
imaging characteristics consistent with benign hemangiomas.

IVC:

No abnormality visualized.

Pancreas:

Visualized portion unremarkable.

Spleen:

Size and appearance within normal limits.

Right Kidney:

Length: 9.7 cm . Anechoic cyst in the upper pole with through
transmission.

Left Kidney:

Length: 10.2 cm. Echogenicity within normal limits. No mass or
hydronephrosis visualized.

Abdominal aorta:

No aneurysm visualized.

Other findings:

Ascites
IMPRESSION: 1. Four hyperechoic lesions in the liver have imaging
characteristics consistent with benign hemangiomas.
2. Simple cyst of the right kidney.

## 2016-06-11 ENCOUNTER — Other Ambulatory Visit: Payer: Self-pay

## 2016-06-11 DIAGNOSIS — C50211 Malignant neoplasm of upper-inner quadrant of right female breast: Secondary | ICD-10-CM

## 2016-06-12 ENCOUNTER — Other Ambulatory Visit (HOSPITAL_BASED_OUTPATIENT_CLINIC_OR_DEPARTMENT_OTHER): Payer: Medicare Other

## 2016-06-12 ENCOUNTER — Encounter: Payer: Self-pay | Admitting: Hematology and Oncology

## 2016-06-12 ENCOUNTER — Ambulatory Visit (HOSPITAL_BASED_OUTPATIENT_CLINIC_OR_DEPARTMENT_OTHER): Payer: Medicare Other

## 2016-06-12 ENCOUNTER — Ambulatory Visit (HOSPITAL_BASED_OUTPATIENT_CLINIC_OR_DEPARTMENT_OTHER): Payer: Medicare Other | Admitting: Hematology and Oncology

## 2016-06-12 DIAGNOSIS — M810 Age-related osteoporosis without current pathological fracture: Secondary | ICD-10-CM

## 2016-06-12 DIAGNOSIS — C50211 Malignant neoplasm of upper-inner quadrant of right female breast: Secondary | ICD-10-CM

## 2016-06-12 DIAGNOSIS — Z170 Estrogen receptor positive status [ER+]: Secondary | ICD-10-CM

## 2016-06-12 LAB — COMPREHENSIVE METABOLIC PANEL
ALBUMIN: 3.7 g/dL (ref 3.5–5.0)
ALK PHOS: 59 U/L (ref 40–150)
ALT: 19 U/L (ref 0–55)
ANION GAP: 8 meq/L (ref 3–11)
AST: 15 U/L (ref 5–34)
BUN: 17.1 mg/dL (ref 7.0–26.0)
CALCIUM: 9.3 mg/dL (ref 8.4–10.4)
CHLORIDE: 108 meq/L (ref 98–109)
CO2: 28 mEq/L (ref 22–29)
Creatinine: 0.7 mg/dL (ref 0.6–1.1)
EGFR: 83 mL/min/{1.73_m2} — ABNORMAL LOW (ref >90)
Glucose: 82 mg/dl (ref 70–140)
POTASSIUM: 4.6 meq/L (ref 3.5–5.1)
Sodium: 144 mEq/L (ref 136–145)
Total Bilirubin: 0.41 mg/dL (ref 0.20–1.20)
Total Protein: 6.9 g/dL (ref 6.4–8.3)

## 2016-06-12 LAB — CBC WITH DIFFERENTIAL/PLATELET
BASO%: 0.4 % (ref 0.0–2.0)
BASOS ABS: 0 10*3/uL (ref 0.0–0.1)
EOS ABS: 0.1 10*3/uL (ref 0.0–0.5)
EOS%: 1.6 % (ref 0.0–7.0)
HEMATOCRIT: 40.1 % (ref 34.8–46.6)
HEMOGLOBIN: 13.5 g/dL (ref 11.6–15.9)
LYMPH#: 1.7 10*3/uL (ref 0.9–3.3)
LYMPH%: 26.3 % (ref 14.0–49.7)
MCH: 35.3 pg — AB (ref 25.1–34.0)
MCHC: 33.6 g/dL (ref 31.5–36.0)
MCV: 104.9 fL — AB (ref 79.5–101.0)
MONO#: 0.4 10*3/uL (ref 0.1–0.9)
MONO%: 6.8 % (ref 0.0–14.0)
NEUT#: 4.1 10*3/uL (ref 1.5–6.5)
NEUT%: 64.9 % (ref 38.4–76.8)
PLATELETS: 199 10*3/uL (ref 145–400)
RBC: 3.83 10*6/uL (ref 3.70–5.45)
RDW: 12.1 % (ref 11.2–14.5)
WBC: 6.3 10*3/uL (ref 3.9–10.3)

## 2016-06-12 MED ORDER — DENOSUMAB 60 MG/ML ~~LOC~~ SOLN
60.0000 mg | Freq: Once | SUBCUTANEOUS | Status: AC
Start: 2016-06-12 — End: 2016-06-12
  Administered 2016-06-12: 60 mg via SUBCUTANEOUS
  Filled 2016-06-12: qty 1

## 2016-06-12 MED ORDER — VENLAFAXINE HCL ER 37.5 MG PO CP24: ORAL_CAPSULE | ORAL | 3 refills | Status: DC

## 2016-06-12 MED ORDER — ANASTROZOLE 1 MG PO TABS: 1.0000 mg | ORAL_TABLET | Freq: Every day | ORAL | 3 refills | Status: DC

## 2016-06-12 NOTE — Assessment & Plan Note (Signed)
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  Anastrozole toxicities: Does not have any further neuropathy/myalgias related to prior tamoxifen therapy Occasional hot flashes well controlled with Effexor  Osteoporosis: Patient had T score of -2.5 and -2.6. On Prolia every 6 months, Return to clinic in 1 year for follow-up to coordinate with her prolia injection; she will come back in 6 months for Prolia injection.

## 2016-06-12 NOTE — Patient Instructions (Signed)
Denosumab injection  What is this medicine?  DENOSUMAB (den oh sue mab) slows bone breakdown. Prolia is used to treat osteoporosis in women after menopause and in men. Xgeva is used to prevent bone fractures and other bone problems caused by cancer bone metastases. Xgeva is also used to treat giant cell tumor of the bone.  This medicine may be used for other purposes; ask your health care provider or pharmacist if you have questions.  What should I tell my health care provider before I take this medicine?  They need to know if you have any of these conditions:  -dental disease  -eczema  -infection or history of infections  -kidney disease or on dialysis  -low blood calcium or vitamin D  -malabsorption syndrome  -scheduled to have surgery or tooth extraction  -taking medicine that contains denosumab  -thyroid or parathyroid disease  -an unusual reaction to denosumab, other medicines, foods, dyes, or preservatives  -pregnant or trying to get pregnant  -breast-feeding  How should I use this medicine?  This medicine is for injection under the skin. It is given by a health care professional in a hospital or clinic setting.  If you are getting Prolia, a special MedGuide will be given to you by the pharmacist with each prescription and refill. Be sure to read this information carefully each time.  For Prolia, talk to your pediatrician regarding the use of this medicine in children. Special care may be needed. For Xgeva, talk to your pediatrician regarding the use of this medicine in children. While this drug may be prescribed for children as young as 13 years for selected conditions, precautions do apply.  Overdosage: If you think you have taken too much of this medicine contact a poison control center or emergency room at once.  NOTE: This medicine is only for you. Do not share this medicine with others.  What if I miss a dose?  It is important not to miss your dose. Call your doctor or health care professional if you are  unable to keep an appointment.  What may interact with this medicine?  Do not take this medicine with any of the following medications:  -other medicines containing denosumab  This medicine may also interact with the following medications:  -medicines that suppress the immune system  -medicines that treat cancer  -steroid medicines like prednisone or cortisone  This list may not describe all possible interactions. Give your health care provider a list of all the medicines, herbs, non-prescription drugs, or dietary supplements you use. Also tell them if you smoke, drink alcohol, or use illegal drugs. Some items may interact with your medicine.  What should I watch for while using this medicine?  Visit your doctor or health care professional for regular checks on your progress. Your doctor or health care professional may order blood tests and other tests to see how you are doing.  Call your doctor or health care professional if you get a cold or other infection while receiving this medicine. Do not treat yourself. This medicine may decrease your body's ability to fight infection.  You should make sure you get enough calcium and vitamin D while you are taking this medicine, unless your doctor tells you not to. Discuss the foods you eat and the vitamins you take with your health care professional.  See your dentist regularly. Brush and floss your teeth as directed. Before you have any dental work done, tell your dentist you are receiving this medicine.  Do   not become pregnant while taking this medicine or for 5 months after stopping it. Women should inform their doctor if they wish to become pregnant or think they might be pregnant. There is a potential for serious side effects to an unborn child. Talk to your health care professional or pharmacist for more information.  What side effects may I notice from receiving this medicine?  Side effects that you should report to your doctor or health care professional as soon as  possible:  -allergic reactions like skin rash, itching or hives, swelling of the face, lips, or tongue  -breathing problems  -chest pain  -fast, irregular heartbeat  -feeling faint or lightheaded, falls  -fever, chills, or any other sign of infection  -muscle spasms, tightening, or twitches  -numbness or tingling  -skin blisters or bumps, or is dry, peels, or red  -slow healing or unexplained pain in the mouth or jaw  -unusual bleeding or bruising  Side effects that usually do not require medical attention (Report these to your doctor or health care professional if they continue or are bothersome.):  -muscle pain  -stomach upset, gas  This list may not describe all possible side effects. Call your doctor for medical advice about side effects. You may report side effects to FDA at 1-800-FDA-1088.  Where should I keep my medicine?  This medicine is only given in a clinic, doctor's office, or other health care setting and will not be stored at home.  NOTE: This sheet is a summary. It may not cover all possible information. If you have questions about this medicine, talk to your doctor, pharmacist, or health care provider.      2016, Elsevier/Gold Standard. (2011-10-08 12:37:47)

## 2016-06-12 NOTE — Progress Notes (Signed)
Patient Care Team: Asencion Noble, MD as PCP - General (Internal Medicine)  DIAGNOSIS:  Encounter Diagnosis  Name Primary?  . Malignant neoplasm of upper-inner quadrant of right breast in female, estrogen receptor positive ([place])     SUMMARY OF ONCOLOGIC HISTORY:   Breast cancer of upper-inner quadrant of right female breast ([place])   12/11/2013 Breast MRI    Right breast upper inner quadrant 2.1 x 1.6 x 1.6 cm oval mass no abnormal lymph nodes      12/14/2013 Initial Diagnosis    Breast cancer of upper-inner quadrant of right female breast      12/23/2013 Surgery    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, 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, lymphovascular invasion is found, for sentinel lymph nodes negative ER 100% PR 100% HER-2 negative ratio 1.1, Ki-67 14%; Oncotype DX score of 15 (10% ROR)      02/15/2014 - 04/02/2014 Radiation Therapy    Adjuvant radiation therapy      04/06/2014 -  Anti-estrogen oral therapy    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 She is tolerating anastrozole much better than tamoxifen. She attributed tamoxifen to neuropathy. She denies any lumps or nodules in the breast. She has occasional hot flashes which are well controlled with Effexor.  REVIEW OF SYSTEMS:   Constitutional: Denies fevers, chills or abnormal weight loss Eyes: Denies blurriness of vision Ears, nose, mouth, throat, and face: Denies mucositis or sore throat Respiratory: Denies cough, dyspnea or wheezes Cardiovascular: Denies palpitation, chest discomfort Gastrointestinal:  Denies nausea, heartburn or change in bowel habits Skin: Denies abnormal skin rashes Lymphatics: Denies new  lymphadenopathy or easy bruising Neurological:Denies numbness, tingling or new weaknesses Behavioral/Psych: Mood is stable, no new changes  Extremities: No lower extremity edema Breast:  denies any pain or lumps or nodules in either breasts All other systems were reviewed with the patient and are negative.  I have reviewed the past medical history, past surgical history, social history and family history with the patient and they are unchanged from previous note.  ALLERGIES:  is allergic to latex.  MEDICATIONS:  Current Outpatient Prescriptions  Medication Sig Dispense Refill  . ALPRAZolam (XANAX) 1 MG tablet Take 1 tablet by mouth as needed.    Marland Kitchen anastrozole (ARIMIDEX) 1 MG tablet Take 1 tablet (1 mg total) by mouth daily. 90 tablet 3  . denosumab (PROLIA) 60 MG/ML SOLN injection Inject 60 mg into the skin every 6 (six) months. Administer in upper arm, thigh, or abdomen    . fish oil-omega-3 fatty acids 1000 MG capsule Take 1,000 g by mouth daily.      . Multiple Vitamin (MULTIVITAMIN) capsule Take 1 capsule by mouth daily.      . penicillin v potassium (VEETID) 500 MG tablet Take 1 tablet by mouth 4 (four) times daily.  0  . tretinoin (RETIN-A) 0.1 % cream     . venlafaxine XR (EFFEXOR-XR) 37.5 MG 24 hr capsule Take 1 capsule by mouth  daily with breakfast 90 capsule 3  . Vitamin D, Ergocalciferol, (DRISDOL) 50000 UNITS CAPS Take 50,000 Units by mouth every 30 (thirty) days.     No current facility-administered medications for this visit.     PHYSICAL  EXAMINATION: ECOG PERFORMANCE STATUS: 1 - Symptomatic but completely ambulatory  Vitals:   06/12/16 0951  BP: (!) 147/76  Pulse: 81  Resp: 18  Temp: 98 F (36.7 C)   Filed Weights   06/12/16 0951  Weight: 135 lb 3.2 oz (61.3 kg)    GENERAL:alert, no distress and comfortable SKIN: skin color, texture, turgor are normal, no rashes or significant lesions EYES: normal, Conjunctiva are pink and non-injected, sclera  clear OROPHARYNX:no exudate, no erythema and lips, buccal mucosa, and tongue normal  NECK: supple, thyroid normal size, non-tender, without nodularity LYMPH:  no palpable lymphadenopathy in the cervical, axillary or inguinal LUNGS: clear to auscultation and percussion with normal breathing effort HEART: regular rate & rhythm and no murmurs and no lower extremity edema ABDOMEN:abdomen soft, non-tender and normal bowel sounds MUSCULOSKELETAL:no cyanosis of digits and no clubbing  NEURO: alert & oriented x 3 with fluent speech, no focal motor/sensory deficits EXTREMITIES: No lower extremity edema   LABORATORY DATA:  I have reviewed the data as listed   Chemistry      Component Value Date/Time   NA 140 12/13/2015 1009   K 4.4 12/13/2015 1009   CL 103 12/22/2013 1430   CO2 29 12/13/2015 1009   BUN 18.7 12/13/2015 1009   CREATININE 0.7 12/13/2015 1009      Component Value Date/Time   CALCIUM 9.5 12/13/2015 1009   ALKPHOS 67 12/13/2015 1009   AST 20 12/13/2015 1009   ALT 23 12/13/2015 1009   BILITOT 0.33 12/13/2015 1009       Lab Results  Component Value Date   WBC 6.3 06/12/2016   HGB 13.5 06/12/2016   HCT 40.1 06/12/2016   MCV 104.9 (H) 06/12/2016   PLT 199 06/12/2016   NEUTROABS 4.1 06/12/2016    ASSESSMENT & PLAN:  Breast cancer of upper-inner quadrant of right female breast 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  Anastrozole toxicities: Does not have any further neuropathy/myalgias related to prior tamoxifen therapy Occasional hot flashes well controlled with Effexor  Osteoporosis: Patient had T score of -2.5 and -2.6. On Prolia every 6  months, Bone density does August 2017 showed slight worsening of the left hip bone density.  Return to clinic in 1 year for follow-up to coordinate with her prolia injection; she will come back in 6 months for Prolia injection.  I spent 25 minutes talking to the patient of which more than half was spent in counseling and coordination of care.  No orders of the defined types were placed in this encounter.  The patient has a good understanding of the overall plan. she agrees with it. she will call with any problems that may develop before the next visit here.   Rulon Eisenmenger, MD 06/12/16

## 2016-12-10 ENCOUNTER — Other Ambulatory Visit: Payer: Self-pay

## 2016-12-10 DIAGNOSIS — C50211 Malignant neoplasm of upper-inner quadrant of right female breast: Secondary | ICD-10-CM

## 2016-12-10 DIAGNOSIS — Z170 Estrogen receptor positive status [ER+]: Principal | ICD-10-CM

## 2016-12-10 DIAGNOSIS — M810 Age-related osteoporosis without current pathological fracture: Secondary | ICD-10-CM

## 2016-12-11 ENCOUNTER — Other Ambulatory Visit (HOSPITAL_BASED_OUTPATIENT_CLINIC_OR_DEPARTMENT_OTHER): Payer: Medicare Other

## 2016-12-11 ENCOUNTER — Ambulatory Visit (HOSPITAL_BASED_OUTPATIENT_CLINIC_OR_DEPARTMENT_OTHER): Payer: Medicare Other

## 2016-12-11 VITALS — BP 140/69 | HR 74 | Temp 97.9°F | Resp 18

## 2016-12-11 DIAGNOSIS — C50211 Malignant neoplasm of upper-inner quadrant of right female breast: Secondary | ICD-10-CM | POA: Diagnosis not present

## 2016-12-11 DIAGNOSIS — M810 Age-related osteoporosis without current pathological fracture: Secondary | ICD-10-CM | POA: Diagnosis not present

## 2016-12-11 DIAGNOSIS — Z170 Estrogen receptor positive status [ER+]: Principal | ICD-10-CM

## 2016-12-11 LAB — COMPREHENSIVE METABOLIC PANEL
ALT: 19 U/L (ref 0–55)
AST: 17 U/L (ref 5–34)
Albumin: 3.6 g/dL (ref 3.5–5.0)
Alkaline Phosphatase: 56 U/L (ref 40–150)
Anion Gap: 6 mEq/L (ref 3–11)
BILIRUBIN TOTAL: 0.43 mg/dL (ref 0.20–1.20)
BUN: 17.3 mg/dL (ref 7.0–26.0)
CO2: 29 meq/L (ref 22–29)
CREATININE: 0.8 mg/dL (ref 0.6–1.1)
Calcium: 9.7 mg/dL (ref 8.4–10.4)
Chloride: 106 mEq/L (ref 98–109)
EGFR: 77 mL/min/{1.73_m2} — ABNORMAL LOW (ref >90)
GLUCOSE: 63 mg/dL — AB (ref 70–140)
Potassium: 4.4 mEq/L (ref 3.5–5.1)
SODIUM: 141 meq/L (ref 136–145)
TOTAL PROTEIN: 6.9 g/dL (ref 6.4–8.3)

## 2016-12-11 LAB — CBC WITH DIFFERENTIAL/PLATELET
BASO%: 0.6 % (ref 0.0–2.0)
BASOS ABS: 0 10*3/uL (ref 0.0–0.1)
EOS ABS: 0.1 10*3/uL (ref 0.0–0.5)
EOS%: 2 % (ref 0.0–7.0)
HCT: 39.7 % (ref 34.8–46.6)
HGB: 13.4 g/dL (ref 11.6–15.9)
LYMPH%: 39.6 % (ref 14.0–49.7)
MCH: 35.4 pg — ABNORMAL HIGH (ref 25.1–34.0)
MCHC: 33.9 g/dL (ref 31.5–36.0)
MCV: 104.4 fL — ABNORMAL HIGH (ref 79.5–101.0)
MONO#: 0.3 10*3/uL (ref 0.1–0.9)
MONO%: 8 % (ref 0.0–14.0)
NEUT%: 49.8 % (ref 38.4–76.8)
NEUTROS ABS: 2.1 10*3/uL (ref 1.5–6.5)
PLATELETS: 211 10*3/uL (ref 145–400)
RBC: 3.8 10*6/uL (ref 3.70–5.45)
RDW: 12.1 % (ref 11.2–14.5)
WBC: 4.3 10*3/uL (ref 3.9–10.3)
lymph#: 1.7 10*3/uL (ref 0.9–3.3)

## 2016-12-11 MED ORDER — DENOSUMAB 60 MG/ML ~~LOC~~ SOLN
60.0000 mg | Freq: Once | SUBCUTANEOUS | Status: AC
  Administered 2016-12-11: 60 mg via SUBCUTANEOUS
  Filled 2016-12-11: qty 1

## 2016-12-11 NOTE — Patient Instructions (Signed)
Denosumab injection  What is this medicine?  DENOSUMAB (den oh sue mab) slows bone breakdown. Prolia is used to treat osteoporosis in women after menopause and in men. Xgeva is used to prevent bone fractures and other bone problems caused by cancer bone metastases. Xgeva is also used to treat giant cell tumor of the bone.  This medicine may be used for other purposes; ask your health care provider or pharmacist if you have questions.  What should I tell my health care provider before I take this medicine?  They need to know if you have any of these conditions:  -dental disease  -eczema  -infection or history of infections  -kidney disease or on dialysis  -low blood calcium or vitamin D  -malabsorption syndrome  -scheduled to have surgery or tooth extraction  -taking medicine that contains denosumab  -thyroid or parathyroid disease  -an unusual reaction to denosumab, other medicines, foods, dyes, or preservatives  -pregnant or trying to get pregnant  -breast-feeding  How should I use this medicine?  This medicine is for injection under the skin. It is given by a health care professional in a hospital or clinic setting.  If you are getting Prolia, a special MedGuide will be given to you by the pharmacist with each prescription and refill. Be sure to read this information carefully each time.  For Prolia, talk to your pediatrician regarding the use of this medicine in children. Special care may be needed. For Xgeva, talk to your pediatrician regarding the use of this medicine in children. While this drug may be prescribed for children as young as 13 years for selected conditions, precautions do apply.  Overdosage: If you think you have taken too much of this medicine contact a poison control center or emergency room at once.  NOTE: This medicine is only for you. Do not share this medicine with others.  What if I miss a dose?  It is important not to miss your dose. Call your doctor or health care professional if you are  unable to keep an appointment.  What may interact with this medicine?  Do not take this medicine with any of the following medications:  -other medicines containing denosumab  This medicine may also interact with the following medications:  -medicines that suppress the immune system  -medicines that treat cancer  -steroid medicines like prednisone or cortisone  This list may not describe all possible interactions. Give your health care provider a list of all the medicines, herbs, non-prescription drugs, or dietary supplements you use. Also tell them if you smoke, drink alcohol, or use illegal drugs. Some items may interact with your medicine.  What should I watch for while using this medicine?  Visit your doctor or health care professional for regular checks on your progress. Your doctor or health care professional may order blood tests and other tests to see how you are doing.  Call your doctor or health care professional if you get a cold or other infection while receiving this medicine. Do not treat yourself. This medicine may decrease your body's ability to fight infection.  You should make sure you get enough calcium and vitamin D while you are taking this medicine, unless your doctor tells you not to. Discuss the foods you eat and the vitamins you take with your health care professional.  See your dentist regularly. Brush and floss your teeth as directed. Before you have any dental work done, tell your dentist you are receiving this medicine.  Do   not become pregnant while taking this medicine or for 5 months after stopping it. Women should inform their doctor if they wish to become pregnant or think they might be pregnant. There is a potential for serious side effects to an unborn child. Talk to your health care professional or pharmacist for more information.  What side effects may I notice from receiving this medicine?  Side effects that you should report to your doctor or health care professional as soon as  possible:  -allergic reactions like skin rash, itching or hives, swelling of the face, lips, or tongue  -breathing problems  -chest pain  -fast, irregular heartbeat  -feeling faint or lightheaded, falls  -fever, chills, or any other sign of infection  -muscle spasms, tightening, or twitches  -numbness or tingling  -skin blisters or bumps, or is dry, peels, or red  -slow healing or unexplained pain in the mouth or jaw  -unusual bleeding or bruising  Side effects that usually do not require medical attention (Report these to your doctor or health care professional if they continue or are bothersome.):  -muscle pain  -stomach upset, gas  This list may not describe all possible side effects. Call your doctor for medical advice about side effects. You may report side effects to FDA at 1-800-FDA-1088.  Where should I keep my medicine?  This medicine is only given in a clinic, doctor's office, or other health care setting and will not be stored at home.  NOTE: This sheet is a summary. It may not cover all possible information. If you have questions about this medicine, talk to your doctor, pharmacist, or health care provider.      2016, Elsevier/Gold Standard. (2011-10-08 12:37:47)

## 2016-12-19 ENCOUNTER — Ambulatory Visit: Payer: Medicare Other | Admitting: Obstetrics and Gynecology

## 2016-12-26 ENCOUNTER — Telehealth: Payer: Self-pay | Admitting: *Deleted

## 2016-12-26 NOTE — Telephone Encounter (Signed)
Patient in 04 recall for 11/2016. Patient needs follow up breat imaging. Please contact patient regarding scheduling

## 2016-12-31 ENCOUNTER — Other Ambulatory Visit: Payer: Self-pay | Admitting: Obstetrics and Gynecology

## 2016-12-31 DIAGNOSIS — Z9889 Other specified postprocedural states: Secondary | ICD-10-CM

## 2017-01-02 NOTE — Telephone Encounter (Signed)
Routed to [employer].

## 2017-01-02 NOTE — Telephone Encounter (Signed)
Patient is scheduled for DX Bil.MMG on 01-14-17 at [facility].

## 2017-01-14 ENCOUNTER — Ambulatory Visit
Admission: RE | Admit: 2017-01-14 | Discharge: 2017-01-14 | Disposition: A | Discharge status: Home / Self Care | Payer: Medicare Other | Source: Ambulatory Visit | Attending: Obstetrics and Gynecology | Admitting: Obstetrics and Gynecology

## 2017-01-14 DIAGNOSIS — Z9889 Other specified postprocedural states: Secondary | ICD-10-CM

## 2017-01-14 HISTORY — DX: Personal history of irradiation: Z92.3

## 2017-03-06 ENCOUNTER — Encounter: Payer: Self-pay | Admitting: Obstetrics and Gynecology

## 2017-03-06 ENCOUNTER — Other Ambulatory Visit: Payer: Self-pay

## 2017-03-06 ENCOUNTER — Other Ambulatory Visit (HOSPITAL_COMMUNITY)
Admission: RE | Admit: 2017-03-06 | Discharge: 2017-03-06 | Disposition: A | Discharge status: Home / Self Care | Payer: Medicare Other | Source: Ambulatory Visit | Attending: Obstetrics and Gynecology | Admitting: Obstetrics and Gynecology

## 2017-03-06 ENCOUNTER — Ambulatory Visit (INDEPENDENT_AMBULATORY_CARE_PROVIDER_SITE_OTHER): Payer: Medicare Other | Admitting: Obstetrics and Gynecology

## 2017-03-06 VITALS — BP 138/70 | HR 80 | Resp 16 | Ht 63.25 in | Wt 136.0 lb

## 2017-03-06 DIAGNOSIS — Z01419 Encounter for gynecological examination (general) (routine) without abnormal findings: Secondary | ICD-10-CM | POA: Insufficient documentation

## 2017-03-06 NOTE — Progress Notes (Signed)
71 y.o. G2P2 Married Caucasian female here for annual exam.    No vaginal bleeding or spotting.   On Arimidex.   Reduced her cholesterol through dietary changes.  ROS - weight gain and night time urination.   PCP:   Dr. Asencion Noble  Patient's last menstrual period was 04/23/1993.           Sexually active: No.  The current method of family planning is tubal ligation and post menopausal status.    Exercising: Yes.    cardio, weights, and line dancing Smoker:  no  Health Maintenance: Pap: 12-15-14 Neg History of abnormal Pap:  no MMG: 01-14-17 Hx Rt.lumpectomy for Br.CA/Density C Neg/Diag.mmg 24Yr./BiRads2:TBC Colonoscopy:  2013 normal with Dr. Brett Canales due 2023 BMD:   12-06-15  Result : Osteoporosis:TBC TDaP: PCP Gardasil:   no HIV:not done Hep C:not done Screening Labs: PCP takes care of labs and her oncology team also.    reports that she quit smoking about 43 years ago. She has a 2.50 pack-year smoking history. she has never used smokeless tobacco. She reports that she does not drink alcohol or use drugs.  Past Medical History:  Diagnosis Date  . Breast cancer, right breast [facility]) August 2015   lumpectomy and radiation therapy  . Fibroids 12/2008   history of  . Osteoporosis   . Personal history of radiation therapy   . Skin cancer    basal cell    Past Surgical History:  Procedure Laterality Date  . APPENDECTOMY    . BREAST SURGERY  2015   right lumpectomy for breast cancer  . COLONOSCOPY  10 years ago   dr Laural Golden  . TOE ARTHROSCOPY  2005   rt great toe spurs  . TONSILLECTOMY    . TUBAL LIGATION  1976  . VAGINAL DELIVERY     times 2    Current Outpatient Medications  Medication Sig Dispense Refill  . ALPRAZolam (XANAX) 1 MG tablet Take 1 tablet by mouth as needed.    Marland Kitchen anastrozole (ARIMIDEX) 1 MG tablet Take 1 tablet (1 mg total) by mouth daily. 90 tablet 3  . denosumab (PROLIA) 60 MG/ML SOLN injection Inject 60 mg into the skin every 6 (six) months.  Administer in upper arm, thigh, or abdomen    . fish oil-omega-3 fatty acids 1000 MG capsule Take 1,000 g by mouth daily.      . Multiple Vitamin (MULTIVITAMIN) capsule Take 1 capsule by mouth daily.      Marland Kitchen tretinoin (RETIN-A) 0.1 % cream     . venlafaxine XR (EFFEXOR-XR) 37.5 MG 24 hr capsule Take 1 capsule by mouth  daily with breakfast 90 capsule 3   No current facility-administered medications for this visit.     Family History  Problem Relation Age of Onset  . Diabetes Brother   . Heart failure Mother   . Aneurysm Mother        abdominal  . Cancer Maternal Grandfather     ROS:  Pertinent items are noted in HPI.  Otherwise, a comprehensive ROS was negative.  Exam:   BP 138/70 (BP Location: Right Arm, Patient Position: Sitting, Cuff Size: Normal)   Pulse 80   Resp 16   Ht 5' 3.25" (1.607 m)   Wt 136 lb (61.7 kg)   LMP 04/23/1993   BMI 23.90 kg/m     General appearance: alert, cooperative and appears stated age Head: Normocephalic, without obvious abnormality, atraumatic Neck: no adenopathy, supple, symmetrical, trachea midline and thyroid  normal to inspection and palpation Lungs: clear to auscultation bilaterally Breasts: right breast scar and left breast with normal appearance.  Bilateral breasts with no masses or tenderness, No nipple retraction or dimpling, No nipple discharge or bleeding, No axillary or supraclavicular adenopathy Heart: regular rate and rhythm Abdomen: soft, non-tender; no masses, no organomegaly Extremities: extremities normal, atraumatic, no cyanosis or edema Skin: Skin color, texture, turgor normal. No rashes or lesions Lymph nodes: Cervical, supraclavicular, and axillary nodes normal. No abnormal inguinal nodes palpated Neurologic: Grossly normal  Pelvic: External genitalia:  no lesions              Urethra:  normal appearing urethra with no masses, tenderness or lesions              Bartholins and Skenes: normal                 Vagina: normal  appearing vagina with normal color and discharge, no lesions              Cervix: no lesions              Pap taken: Yes.   Bimanual Exam:  Uterus:  normal size, contour, position, consistency, mobility, non-tender              Adnexa: no mass, fullness, tenderness              Rectal exam: Yes.  .  Confirms.              Anus:  normal sphincter tone, hemorrhoids noted.  Chaperone was present for exam.  Assessment:   Well woman visit with normal exam. Right breast cancer. Current Arimidex tx.  Osteoporosis.  On Prolia. Menopausal symptoms treated with Effexor.    Plan: Mammogram screening discussed. Recommended self breast awareness. Pap and HR HPV as above. Guidelines for Calcium, Vitamin D, regular exercise program including cardiovascular and weight bearing exercise.   Follow up annually and prn.   After visit summary provided.

## 2017-03-06 NOTE — Patient Instructions (Signed)

## 2017-03-07 LAB — CYTOLOGY - PAP: Diagnosis: NEGATIVE

## 2017-05-11 ENCOUNTER — Other Ambulatory Visit: Payer: Self-pay | Admitting: Hematology and Oncology

## 2017-05-11 DIAGNOSIS — Z170 Estrogen receptor positive status [ER+]: Principal | ICD-10-CM

## 2017-05-11 DIAGNOSIS — C50211 Malignant neoplasm of upper-inner quadrant of right female breast: Secondary | ICD-10-CM

## 2017-06-07 ENCOUNTER — Other Ambulatory Visit: Payer: Self-pay | Admitting: Hematology and Oncology

## 2017-06-10 ENCOUNTER — Other Ambulatory Visit: Payer: Self-pay | Admitting: *Deleted

## 2017-06-10 DIAGNOSIS — C50211 Malignant neoplasm of upper-inner quadrant of right female breast: Secondary | ICD-10-CM

## 2017-06-10 DIAGNOSIS — Z170 Estrogen receptor positive status [ER+]: Principal | ICD-10-CM

## 2017-06-11 ENCOUNTER — Telehealth: Payer: Self-pay | Admitting: Hematology and Oncology

## 2017-06-11 ENCOUNTER — Inpatient Hospital Stay: Payer: Medicare Other

## 2017-06-11 ENCOUNTER — Inpatient Hospital Stay: Payer: Medicare Other | Attending: Hematology and Oncology | Admitting: Hematology and Oncology

## 2017-06-11 DIAGNOSIS — M810 Age-related osteoporosis without current pathological fracture: Secondary | ICD-10-CM | POA: Insufficient documentation

## 2017-06-11 DIAGNOSIS — C50211 Malignant neoplasm of upper-inner quadrant of right female breast: Secondary | ICD-10-CM | POA: Diagnosis not present

## 2017-06-11 DIAGNOSIS — Z79899 Other long term (current) drug therapy: Secondary | ICD-10-CM | POA: Diagnosis not present

## 2017-06-11 DIAGNOSIS — G629 Polyneuropathy, unspecified: Secondary | ICD-10-CM | POA: Diagnosis not present

## 2017-06-11 DIAGNOSIS — Z923 Personal history of irradiation: Secondary | ICD-10-CM | POA: Diagnosis not present

## 2017-06-11 DIAGNOSIS — Z170 Estrogen receptor positive status [ER+]: Principal | ICD-10-CM

## 2017-06-11 DIAGNOSIS — Z79811 Long term (current) use of aromatase inhibitors: Secondary | ICD-10-CM | POA: Insufficient documentation

## 2017-06-11 LAB — CBC WITH DIFFERENTIAL (CANCER CENTER ONLY)
BASOS ABS: 0 10*3/uL (ref 0.0–0.1)
BASOS PCT: 1 %
EOS ABS: 0.2 10*3/uL (ref 0.0–0.5)
Eosinophils Relative: 5 %
HCT: 39.9 % (ref 34.8–46.6)
HEMOGLOBIN: 13.4 g/dL (ref 11.6–15.9)
LYMPHS ABS: 1.8 10*3/uL (ref 0.9–3.3)
Lymphocytes Relative: 40 %
MCH: 35.2 pg — ABNORMAL HIGH (ref 25.1–34.0)
MCHC: 33.6 g/dL (ref 31.5–36.0)
MCV: 104.7 fL — ABNORMAL HIGH (ref 79.5–101.0)
Monocytes Absolute: 0.3 10*3/uL (ref 0.1–0.9)
Monocytes Relative: 7 %
NEUTROS PCT: 47 %
Neutro Abs: 2.1 10*3/uL (ref 1.5–6.5)
Platelet Count: 220 10*3/uL (ref 145–400)
RBC: 3.81 MIL/uL (ref 3.70–5.45)
RDW: 12.1 % (ref 11.2–14.5)
WBC: 4.4 10*3/uL (ref 3.9–10.3)

## 2017-06-11 LAB — CMP (CANCER CENTER ONLY)
ALT: 24 U/L (ref 0–55)
AST: 19 U/L (ref 5–34)
Albumin: 3.7 g/dL (ref 3.5–5.0)
Alkaline Phosphatase: 55 U/L (ref 40–150)
Anion gap: 6 (ref 3–11)
BUN: 18 mg/dL (ref 7–26)
CALCIUM: 9.5 mg/dL (ref 8.4–10.4)
CHLORIDE: 108 mmol/L (ref 98–109)
CO2: 27 mmol/L (ref 22–29)
Creatinine: 0.74 mg/dL (ref 0.60–1.10)
Glucose, Bld: 68 mg/dL — ABNORMAL LOW (ref 70–140)
Potassium: 4.4 mmol/L (ref 3.5–5.1)
Sodium: 141 mmol/L (ref 136–145)
TOTAL PROTEIN: 6.9 g/dL (ref 6.4–8.3)
Total Bilirubin: 0.4 mg/dL (ref 0.2–1.2)

## 2017-06-11 MED ORDER — ANASTROZOLE 1 MG PO TABS: 1.0000 mg | ORAL_TABLET | Freq: Every day | ORAL | 3 refills | Status: DC

## 2017-06-11 MED ORDER — DENOSUMAB 60 MG/ML ~~LOC~~ SOLN
60.0000 mg | Freq: Once | SUBCUTANEOUS | Status: AC
  Administered 2017-06-11: 60 mg via SUBCUTANEOUS

## 2017-06-11 NOTE — Patient Instructions (Signed)
Denosumab injection  What is this medicine?  DENOSUMAB (den oh sue mab) slows bone breakdown. Prolia is used to treat osteoporosis in women after menopause and in men. Xgeva is used to prevent bone fractures and other bone problems caused by cancer bone metastases. Xgeva is also used to treat giant cell tumor of the bone.  This medicine may be used for other purposes; ask your health care provider or pharmacist if you have questions.  What should I tell my health care provider before I take this medicine?  They need to know if you have any of these conditions:  -dental disease  -eczema  -infection or history of infections  -kidney disease or on dialysis  -low blood calcium or vitamin D  -malabsorption syndrome  -scheduled to have surgery or tooth extraction  -taking medicine that contains denosumab  -thyroid or parathyroid disease  -an unusual reaction to denosumab, other medicines, foods, dyes, or preservatives  -pregnant or trying to get pregnant  -breast-feeding  How should I use this medicine?  This medicine is for injection under the skin. It is given by a health care professional in a hospital or clinic setting.  If you are getting Prolia, a special MedGuide will be given to you by the pharmacist with each prescription and refill. Be sure to read this information carefully each time.  For Prolia, talk to your pediatrician regarding the use of this medicine in children. Special care may be needed. For Xgeva, talk to your pediatrician regarding the use of this medicine in children. While this drug may be prescribed for children as young as 13 years for selected conditions, precautions do apply.  Overdosage: If you think you have taken too much of this medicine contact a poison control center or emergency room at once.  NOTE: This medicine is only for you. Do not share this medicine with others.  What if I miss a dose?  It is important not to miss your dose. Call your doctor or health care professional if you are  unable to keep an appointment.  What may interact with this medicine?  Do not take this medicine with any of the following medications:  -other medicines containing denosumab  This medicine may also interact with the following medications:  -medicines that suppress the immune system  -medicines that treat cancer  -steroid medicines like prednisone or cortisone  This list may not describe all possible interactions. Give your health care provider a list of all the medicines, herbs, non-prescription drugs, or dietary supplements you use. Also tell them if you smoke, drink alcohol, or use illegal drugs. Some items may interact with your medicine.  What should I watch for while using this medicine?  Visit your doctor or health care professional for regular checks on your progress. Your doctor or health care professional may order blood tests and other tests to see how you are doing.  Call your doctor or health care professional if you get a cold or other infection while receiving this medicine. Do not treat yourself. This medicine may decrease your body's ability to fight infection.  You should make sure you get enough calcium and vitamin D while you are taking this medicine, unless your doctor tells you not to. Discuss the foods you eat and the vitamins you take with your health care professional.  See your dentist regularly. Brush and floss your teeth as directed. Before you have any dental work done, tell your dentist you are receiving this medicine.  Do   not become pregnant while taking this medicine or for 5 months after stopping it. Women should inform their doctor if they wish to become pregnant or think they might be pregnant. There is a potential for serious side effects to an unborn child. Talk to your health care professional or pharmacist for more information.  What side effects may I notice from receiving this medicine?  Side effects that you should report to your doctor or health care professional as soon as  possible:  -allergic reactions like skin rash, itching or hives, swelling of the face, lips, or tongue  -breathing problems  -chest pain  -fast, irregular heartbeat  -feeling faint or lightheaded, falls  -fever, chills, or any other sign of infection  -muscle spasms, tightening, or twitches  -numbness or tingling  -skin blisters or bumps, or is dry, peels, or red  -slow healing or unexplained pain in the mouth or jaw  -unusual bleeding or bruising  Side effects that usually do not require medical attention (Report these to your doctor or health care professional if they continue or are bothersome.):  -muscle pain  -stomach upset, gas  This list may not describe all possible side effects. Call your doctor for medical advice about side effects. You may report side effects to FDA at 1-800-FDA-1088.  Where should I keep my medicine?  This medicine is only given in a clinic, doctor's office, or other health care setting and will not be stored at home.  NOTE: This sheet is a summary. It may not cover all possible information. If you have questions about this medicine, talk to your doctor, pharmacist, or health care provider.      2016, Elsevier/Gold Standard. (2011-10-08 12:37:47)

## 2017-06-11 NOTE — Telephone Encounter (Signed)
Gave patient AVs and calendar of upcoming February and August appointments.

## 2017-06-11 NOTE — Assessment & Plan Note (Addendum)
Right breast invasive ductal carcinoma1.4 cm T1 C. N0 M0 stage IA ER 100% by mouth 100% HER-2 negative Ki-67 14% grade 1, Oncotype DX recurrence score 13 (ROR 10%)  Treatment summary: Tamoxifen 20 mg daily started 01/22/2014 changed to anastrozole October 2016 due to progressively worsening neuropathy And leg swelling which she attributed to tamoxifen treatment.  Anastrozole toxicities: Does not have any further neuropathy/myalgias related to prior tamoxifen therapy Occasional hot flashes well controlled with Effexor  Osteoporosis: Patient had T score of -2.5 and -2.6. On Prolia every 6 months, Bone density does August 2017 showed slight worsening of the left hip bone density.  Surveillance: 1.  Breast exam 06/11/2017: 2. mammogram: 01/14/2017: Benign breast density category C  Return to clinic in 1 year for follow-up to coordinate with her prolia injection; she will come back in 6 months for Prolia injection.

## 2017-06-11 NOTE — Progress Notes (Signed)
Patient Care Team: Asencion Noble, MD as PCP - General (Internal Medicine)  DIAGNOSIS:  Encounter Diagnosis  Name Primary?  . Malignant neoplasm of upper-inner quadrant of right breast in female, estrogen receptor positive ([place])     SUMMARY OF ONCOLOGIC HISTORY:   Breast cancer of upper-inner quadrant of right female breast ([place])   12/11/2013 Breast MRI    Right breast upper inner quadrant 2.1 x 1.6 x 1.6 cm oval mass no abnormal lymph nodes      12/14/2013 Initial Diagnosis    Breast cancer of upper-inner quadrant of right female breast      12/23/2013 Surgery    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, 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, lymphovascular invasion is found, for sentinel lymph nodes negative ER 100% PR 100% HER-2 negative ratio 1.1, Ki-67 14%; Oncotype DX score of 15 (10% ROR)      02/15/2014 - 04/02/2014 Radiation Therapy    Adjuvant radiation therapy      04/06/2014 -  Anti-estrogen oral therapy    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  If she is tolerating anastrozole fairly well.  She did have hot flashes previously but they have improved with Effexor.  She denies any pain lumps or nodules in the breast.  REVIEW OF SYSTEMS:   Constitutional: Denies fevers, chills or abnormal weight loss Eyes: Denies blurriness of vision Ears, nose, mouth, throat, and face: Denies mucositis or sore throat Respiratory: Denies cough, dyspnea or wheezes Cardiovascular: Denies palpitation, chest discomfort Gastrointestinal:  Denies nausea, heartburn or change in bowel habits Skin: Denies  abnormal skin rashes Lymphatics: Denies new lymphadenopathy or easy bruising Neurological:Denies numbness, tingling or new weaknesses Behavioral/Psych: Mood is stable, no new changes  Extremities: No lower extremity edema Breast:  denies any pain or lumps or nodules in either breasts All other systems were reviewed with the patient and are negative.  I have reviewed the past medical history, past surgical history, social history and family history with the patient and they are unchanged from previous note.  ALLERGIES:  is allergic to latex.  MEDICATIONS:  Current Outpatient Medications  Medication Sig Dispense Refill  . ALPRAZolam (XANAX) 1 MG tablet Take 1 tablet by mouth as needed.    Marland Kitchen anastrozole (ARIMIDEX) 1 MG tablet TAKE 1 TABLET BY MOUTH  DAILY 90 tablet 3  . denosumab (PROLIA) 60 MG/ML SOLN injection Inject 60 mg into the skin every 6 (six) months. Administer in upper arm, thigh, or abdomen    . fish oil-omega-3 fatty acids 1000 MG capsule Take 1,000 g by mouth daily.      . Multiple Vitamin (MULTIVITAMIN) capsule Take 1 capsule by mouth daily.      Marland Kitchen tretinoin (RETIN-A) 0.1 % cream     . venlafaxine XR (EFFEXOR-XR) 37.5 MG 24 hr capsule TAKE 1 CAPSULE BY MOUTH  DAILY WITH BREAKFAST 90 capsule 3   No current facility-administered medications for this visit.     PHYSICAL EXAMINATION: ECOG PERFORMANCE STATUS: 1 - Symptomatic but completely ambulatory  Vitals:   06/11/17 1004  BP: (!) 154/79  Pulse: 79  Resp: 18  Temp: (!) 97.5 F (  36.4 C)  SpO2: 100%   Filed Weights   06/11/17 1004  Weight: 138 lb 14.4 oz (63 kg)    GENERAL:alert, no distress and comfortable SKIN: skin color, texture, turgor are normal, no rashes or significant lesions EYES: normal, Conjunctiva are pink and non-injected, sclera clear OROPHARYNX:no exudate, no erythema and lips, buccal mucosa, and tongue normal  NECK: supple, thyroid normal size, non-tender, without nodularity LYMPH:  no palpable  lymphadenopathy in the cervical, axillary or inguinal LUNGS: clear to auscultation and percussion with normal breathing effort HEART: regular rate & rhythm and no murmurs and no lower extremity edema ABDOMEN:abdomen soft, non-tender and normal bowel sounds MUSCULOSKELETAL:no cyanosis of digits and no clubbing  NEURO: alert & oriented x 3 with fluent speech, no focal motor/sensory deficits EXTREMITIES: No lower extremity edema BREAST: No palpable masses or nodules in either right or left breasts. No palpable axillary supraclavicular or infraclavicular adenopathy no breast tenderness or nipple discharge. (exam performed in the presence of a chaperone)  LABORATORY DATA:  I have reviewed the data as listed CMP Latest Ref Rng & Units 12/11/2016 06/12/2016 12/13/2015  Glucose 70 - 140 mg/dl 63(L) 82 94  BUN 7.0 - 26.0 mg/dL 17.3 17.1 18.7  Creatinine 0.6 - 1.1 mg/dL 0.8 0.7 0.7  Sodium 136 - 145 mEq/L 141 144 140  Potassium 3.5 - 5.1 mEq/L 4.4 4.6 4.4  Chloride 96 - 112 mEq/L - - -  CO2 22 - 29 mEq/L 29 28 29  Calcium 8.4 - 10.4 mg/dL 9.7 9.3 9.5  Total Protein 6.4 - 8.3 g/dL 6.9 6.9 7.0  Total Bilirubin 0.20 - 1.20 mg/dL 0.43 0.41 0.33  Alkaline Phos 40 - 150 U/L 56 59 67  AST 5 - 34 U/L 17 15 20  ALT 0 - 55 U/L 19 19 23    Lab Results  Component Value Date   WBC 4.4 06/11/2017   HGB 13.4 12/11/2016   HCT 39.9 06/11/2017   MCV 104.7 (H) 06/11/2017   PLT 220 06/11/2017   NEUTROABS 2.1 06/11/2017    ASSESSMENT & PLAN:  Breast cancer of upper-inner quadrant of right female breast Right breast invasive ductal carcinoma1.4 cm T1 C. N0 M0 stage IA ER 100% by mouth 100% HER-2 negative Ki-67 14% grade 1, Oncotype DX recurrence score 13 (ROR 10%)  Treatment summary: Tamoxifen 20 mg daily started 01/22/2014 changed to anastrozole October 2016 due to progressively worsening neuropathy And leg swelling which she attributed to tamoxifen treatment.  Anastrozole toxicities: Does not have any  further neuropathy/myalgias related to prior tamoxifen therapy Occasional hot flashes well controlled with Effexor  Osteoporosis: Patient had T score of -2.5 and -2.6. On Prolia every 6 months, Bone density does August 2017 showed slight worsening of the left hip bone density.  Surveillance: 1.  Breast exam 06/11/2017: No palpable lumps or nodules of concern 2. mammogram: 01/14/2017: Benign breast density category C  Return to clinic in 1 year for follow-up to coordinate with her prolia injection; she will come back in 6 months for Prolia injection.  I spent 25 minutes talking to the patient of which more than half was spent in counseling and coordination of care.  No orders of the defined types were placed in this encounter.  The patient has a good understanding of the overall plan. she agrees with it. she will call with any problems that may develop before the next visit here.   Viinay K Gudena, MD 06/11/17    

## 2017-08-20 ENCOUNTER — Telehealth: Payer: Self-pay

## 2017-08-20 NOTE — Telephone Encounter (Signed)
Left VM for patient regarding rescheduling August 20th appt due to being out of town. Patient can reschedule prolia injection and labs week prior or following vacation.  Cyndia Bent  RN

## 2017-08-21 ENCOUNTER — Telehealth: Payer: Self-pay | Admitting: Hematology and Oncology

## 2017-08-21 NOTE — Telephone Encounter (Signed)
Mailed patient calendar of upcoming august appointments per 5/1 sch message.

## 2017-12-05 ENCOUNTER — Telehealth: Payer: Self-pay | Admitting: Obstetrics and Gynecology

## 2017-12-05 ENCOUNTER — Other Ambulatory Visit: Payer: Self-pay | Admitting: Obstetrics and Gynecology

## 2017-12-05 DIAGNOSIS — M810 Age-related osteoporosis without current pathological fracture: Secondary | ICD-10-CM

## 2017-12-05 DIAGNOSIS — Z1231 Encounter for screening mammogram for malignant neoplasm of breast: Secondary | ICD-10-CM

## 2017-12-05 NOTE — Telephone Encounter (Signed)
Per review of Epic, last BMD at [facility] on 12/06/15, ordered by Dr. Quincy Simmonds. Osteoporosis, repeat 23yrs.   Order placed for BMD at [facility]. Call returned to patient. Advised order placed. Patient will contact [facility] directly to schedule.   Routing to provider for final review. Patient is agreeable to disposition. Will close encounter.

## 2017-12-05 NOTE — Telephone Encounter (Signed)
Patient requesting order for Bone Density.

## 2017-12-10 ENCOUNTER — Ambulatory Visit: Payer: Medicare Other

## 2017-12-10 ENCOUNTER — Other Ambulatory Visit: Payer: Medicare Other

## 2017-12-16 ENCOUNTER — Other Ambulatory Visit: Payer: Self-pay

## 2017-12-16 DIAGNOSIS — Z170 Estrogen receptor positive status [ER+]: Principal | ICD-10-CM

## 2017-12-16 DIAGNOSIS — C50211 Malignant neoplasm of upper-inner quadrant of right female breast: Secondary | ICD-10-CM

## 2017-12-17 ENCOUNTER — Inpatient Hospital Stay: Payer: Medicare Other | Attending: Hematology and Oncology

## 2017-12-17 ENCOUNTER — Inpatient Hospital Stay: Payer: Medicare Other

## 2017-12-17 VITALS — BP 128/85 | HR 80 | Temp 97.9°F | Resp 17

## 2017-12-17 DIAGNOSIS — M810 Age-related osteoporosis without current pathological fracture: Secondary | ICD-10-CM | POA: Diagnosis not present

## 2017-12-17 DIAGNOSIS — C50211 Malignant neoplasm of upper-inner quadrant of right female breast: Secondary | ICD-10-CM

## 2017-12-17 DIAGNOSIS — Z79899 Other long term (current) drug therapy: Secondary | ICD-10-CM | POA: Diagnosis not present

## 2017-12-17 DIAGNOSIS — Z170 Estrogen receptor positive status [ER+]: Secondary | ICD-10-CM

## 2017-12-17 LAB — CBC WITH DIFFERENTIAL (CANCER CENTER ONLY)
Basophils Absolute: 0 10*3/uL (ref 0.0–0.1)
Basophils Relative: 1 %
EOS ABS: 0.1 10*3/uL (ref 0.0–0.5)
Eosinophils Relative: 2 %
HEMATOCRIT: 39.9 % (ref 34.8–46.6)
HEMOGLOBIN: 13.3 g/dL (ref 11.6–15.9)
LYMPHS ABS: 1.6 10*3/uL (ref 0.9–3.3)
LYMPHS PCT: 37 %
MCH: 34.4 pg — AB (ref 25.1–34.0)
MCHC: 33.2 g/dL (ref 31.5–36.0)
MCV: 103.5 fL — AB (ref 79.5–101.0)
Monocytes Absolute: 0.3 10*3/uL (ref 0.1–0.9)
Monocytes Relative: 8 %
NEUTROS ABS: 2.2 10*3/uL (ref 1.5–6.5)
NEUTROS PCT: 52 %
Platelet Count: 216 10*3/uL (ref 145–400)
RBC: 3.85 MIL/uL (ref 3.70–5.45)
RDW: 12.4 % (ref 11.2–14.5)
WBC Count: 4.3 10*3/uL (ref 3.9–10.3)

## 2017-12-17 LAB — CMP (CANCER CENTER ONLY)
ALK PHOS: 54 U/L (ref 38–126)
ALT: 20 U/L (ref 0–44)
ANION GAP: 7 (ref 5–15)
AST: 19 U/L (ref 15–41)
Albumin: 3.8 g/dL (ref 3.5–5.0)
BILIRUBIN TOTAL: 0.3 mg/dL (ref 0.3–1.2)
BUN: 15 mg/dL (ref 8–23)
CALCIUM: 9.6 mg/dL (ref 8.9–10.3)
CO2: 28 mmol/L (ref 22–32)
CREATININE: 0.77 mg/dL (ref 0.44–1.00)
Chloride: 109 mmol/L (ref 98–111)
GFR, Estimated: >60 mL/min (ref >60)
Glucose, Bld: 80 mg/dL (ref 70–99)
Potassium: 4.3 mmol/L (ref 3.5–5.1)
SODIUM: 144 mmol/L (ref 135–145)
TOTAL PROTEIN: 6.9 g/dL (ref 6.5–8.1)

## 2017-12-17 MED ORDER — DENOSUMAB 60 MG/ML ~~LOC~~ SOSY
60.0000 mg | PREFILLED_SYRINGE | Freq: Once | SUBCUTANEOUS | Status: AC
  Administered 2017-12-17: 60 mg via SUBCUTANEOUS

## 2017-12-17 MED ORDER — DENOSUMAB 60 MG/ML ~~LOC~~ SOLN: 60.0000 mg | Freq: Once | SUBCUTANEOUS | Status: DC

## 2017-12-17 NOTE — Patient Instructions (Signed)
Denosumab injection  What is this medicine?  DENOSUMAB (den oh sue mab) slows bone breakdown. Prolia is used to treat osteoporosis in women after menopause and in men. Xgeva is used to prevent bone fractures and other bone problems caused by cancer bone metastases. Xgeva is also used to treat giant cell tumor of the bone.  This medicine may be used for other purposes; ask your health care provider or pharmacist if you have questions.  What should I tell my health care provider before I take this medicine?  They need to know if you have any of these conditions:  -dental disease  -eczema  -infection or history of infections  -kidney disease or on dialysis  -low blood calcium or vitamin D  -malabsorption syndrome  -scheduled to have surgery or tooth extraction  -taking medicine that contains denosumab  -thyroid or parathyroid disease  -an unusual reaction to denosumab, other medicines, foods, dyes, or preservatives  -pregnant or trying to get pregnant  -breast-feeding  How should I use this medicine?  This medicine is for injection under the skin. It is given by a health care professional in a hospital or clinic setting.  If you are getting Prolia, a special MedGuide will be given to you by the pharmacist with each prescription and refill. Be sure to read this information carefully each time.  For Prolia, talk to your pediatrician regarding the use of this medicine in children. Special care may be needed. For Xgeva, talk to your pediatrician regarding the use of this medicine in children. While this drug may be prescribed for children as young as 13 years for selected conditions, precautions do apply.  Overdosage: If you think you have taken too much of this medicine contact a poison control center or emergency room at once.  NOTE: This medicine is only for you. Do not share this medicine with others.  What if I miss a dose?  It is important not to miss your dose. Call your doctor or health care professional if you are  unable to keep an appointment.  What may interact with this medicine?  Do not take this medicine with any of the following medications:  -other medicines containing denosumab  This medicine may also interact with the following medications:  -medicines that suppress the immune system  -medicines that treat cancer  -steroid medicines like prednisone or cortisone  This list may not describe all possible interactions. Give your health care provider a list of all the medicines, herbs, non-prescription drugs, or dietary supplements you use. Also tell them if you smoke, drink alcohol, or use illegal drugs. Some items may interact with your medicine.  What should I watch for while using this medicine?  Visit your doctor or health care professional for regular checks on your progress. Your doctor or health care professional may order blood tests and other tests to see how you are doing.  Call your doctor or health care professional if you get a cold or other infection while receiving this medicine. Do not treat yourself. This medicine may decrease your body's ability to fight infection.  You should make sure you get enough calcium and vitamin D while you are taking this medicine, unless your doctor tells you not to. Discuss the foods you eat and the vitamins you take with your health care professional.  See your dentist regularly. Brush and floss your teeth as directed. Before you have any dental work done, tell your dentist you are receiving this medicine.  Do   not become pregnant while taking this medicine or for 5 months after stopping it. Women should inform their doctor if they wish to become pregnant or think they might be pregnant. There is a potential for serious side effects to an unborn child. Talk to your health care professional or pharmacist for more information.  What side effects may I notice from receiving this medicine?  Side effects that you should report to your doctor or health care professional as soon as  possible:  -allergic reactions like skin rash, itching or hives, swelling of the face, lips, or tongue  -breathing problems  -chest pain  -fast, irregular heartbeat  -feeling faint or lightheaded, falls  -fever, chills, or any other sign of infection  -muscle spasms, tightening, or twitches  -numbness or tingling  -skin blisters or bumps, or is dry, peels, or red  -slow healing or unexplained pain in the mouth or jaw  -unusual bleeding or bruising  Side effects that usually do not require medical attention (Report these to your doctor or health care professional if they continue or are bothersome.):  -muscle pain  -stomach upset, gas  This list may not describe all possible side effects. Call your doctor for medical advice about side effects. You may report side effects to FDA at 1-800-FDA-1088.  Where should I keep my medicine?  This medicine is only given in a clinic, doctor's office, or other health care setting and will not be stored at home.  NOTE: This sheet is a summary. It may not cover all possible information. If you have questions about this medicine, talk to your doctor, pharmacist, or health care provider.      2016, Elsevier/Gold Standard. (2011-10-08 12:37:47)

## 2018-01-13 ENCOUNTER — Other Ambulatory Visit: Payer: Self-pay | Admitting: Obstetrics and Gynecology

## 2018-01-13 DIAGNOSIS — Z853 Personal history of malignant neoplasm of breast: Secondary | ICD-10-CM

## 2018-01-15 ENCOUNTER — Ambulatory Visit
Admission: RE | Admit: 2018-01-15 | Discharge: 2018-01-15 | Disposition: A | Discharge status: Home / Self Care | Payer: Medicare Other | Source: Ambulatory Visit | Attending: Obstetrics and Gynecology | Admitting: Obstetrics and Gynecology

## 2018-01-15 DIAGNOSIS — Z853 Personal history of malignant neoplasm of breast: Secondary | ICD-10-CM

## 2018-01-23 ENCOUNTER — Ambulatory Visit
Admission: RE | Admit: 2018-01-23 | Discharge: 2018-01-23 | Disposition: A | Discharge status: Home / Self Care | Payer: Medicare Other | Source: Ambulatory Visit | Attending: Obstetrics and Gynecology | Admitting: Obstetrics and Gynecology

## 2018-01-23 DIAGNOSIS — M810 Age-related osteoporosis without current pathological fracture: Secondary | ICD-10-CM

## 2018-03-31 NOTE — Progress Notes (Signed)
72 y.o. G2P2 Married Caucasian female here for annual exam.    Gained 10 pounds.   Denies vaginal bleeding or spotting.  No bladder or bowel function problems.   Renovating an apt.   PCP: Asencion Noble, Md    Patient's last menstrual period was 04/23/1993.           Sexually active: No. female The current method of family planning is tubal ligation/postmenopausal.    Exercising: Yes.    cardio, weights and line dancing. Smoker:  no  Health Maintenance: Pap: 03-06-17 Neg, 12-15-14 Neg History of abnormal Pap:  no MMG: 01-15-18 Diag.Bil./Neg/density C/BiRads2/Diag.Bil.52yr Colonoscopy: 2013 normal with Dr. Brett Canales due 2023 BMD: 01-23-18 Result : Osteoporosis TDaP: PCP Gardasil:   no HIV: no Hep C: no Screening Labs:  Hb today: PCP Flu vaccine:  Done.   reports that she quit smoking about 44 years ago. She has a 2.50 pack-year smoking history. She has never used smokeless tobacco. She reports that she does not drink alcohol or use drugs.  Past Medical History:  Diagnosis Date  . Breast cancer, right breast [facility]) August 2015   lumpectomy and radiation therapy  . Fibroids 12/2008   history of  . Osteoporosis   . Personal history of radiation therapy   . Skin cancer    basal cell    Past Surgical History:  Procedure Laterality Date  . APPENDECTOMY    . BREAST SURGERY  2015   right lumpectomy for breast cancer  . COLONOSCOPY  10 years ago   dr Laural Golden  . COLONOSCOPY  12/07/2010   Procedure: COLONOSCOPY;  Surgeon: Rogene Houston, MD;  Location: [facility];  Service: Endoscopy;  Laterality: N/A;  . TOE ARTHROSCOPY  2005   rt great toe spurs  . TONSILLECTOMY    . TUBAL LIGATION  1976  . VAGINAL DELIVERY     times 2    Current Outpatient Medications  Medication Sig Dispense Refill  . ALPRAZolam (XANAX) 1 MG tablet Take 1 tablet by mouth as needed.    Marland Kitchen anastrozole (ARIMIDEX) 1 MG tablet Take 1 tablet (1 mg total) by mouth daily. 90 tablet 3  . denosumab (PROLIA) 60  MG/ML SOLN injection Inject 60 mg into the skin every 6 (six) months. Administer in upper arm, thigh, or abdomen    . fish oil-omega-3 fatty acids 1000 MG capsule Take 2 g by mouth daily.     . Multiple Vitamin (MULTIVITAMIN) capsule Take 1 capsule by mouth daily.      Marland Kitchen tretinoin (RETIN-A) 0.1 % cream     . venlafaxine XR (EFFEXOR-XR) 37.5 MG 24 hr capsule TAKE 1 CAPSULE BY MOUTH  DAILY WITH BREAKFAST 90 capsule 3   No current facility-administered medications for this visit.     Family History  Problem Relation Age of Onset  . Diabetes Brother   . Heart failure Mother   . Aneurysm Mother        abdominal  . Cancer Maternal Grandfather     Review of Systems  Hematological: Bruises/bleeds easily.  All other systems reviewed and are negative.   Exam:   BP 112/66 (BP Location: Right Arm, Patient Position: Sitting, Cuff Size: Normal)   Pulse 80   Resp 16   Ht 5\' 3"  (1.6 m)   Wt 136 lb 12.8 oz (62.1 kg)   LMP 04/23/1993   BMI 24.23 kg/m     General appearance: alert, cooperative and appears stated age Head: Normocephalic, without obvious abnormality,  atraumatic Neck: no adenopathy, supple, symmetrical, trachea midline and thyroid normal to inspection and palpation Lungs: clear to auscultation bilaterally Breasts: normal appearance, no masses or tenderness, No nipple retraction or dimpling, No nipple discharge or bleeding, No axillary or supraclavicular adenopathy Heart: regular rate and rhythm Abdomen: soft, non-tender; no masses, no organomegaly Extremities: extremities normal, atraumatic, no cyanosis or edema Skin: Skin color, texture, turgor normal. No rashes or lesions Lymph nodes: Cervical, supraclavicular, and axillary nodes normal. No abnormal inguinal nodes palpated Neurologic: Grossly normal  Pelvic: External genitalia:  no lesions              Urethra:  normal appearing urethra with no masses, tenderness or lesions              Bartholins and Skenes: normal                  Vagina: normal appearing vagina with normal color and discharge, no lesions              Cervix: no lesions              Pap taken: No. Bimanual Exam:  Uterus:  normal size, contour, position, consistency, mobility, non-tender              Adnexa: no mass, fullness, tenderness              Rectal exam: Yes.  .  Confirms.              Anus:  normal sphincter tone, no lesions  Chaperone was present for exam.  Assessment:   Well woman visit with normal exam. Right breast cancer. Current Arimidex tx.  Osteoporosis. On Prolia. Menopausal symptoms treated with Effexor.  Plan: Mammogram screening. Recommended self breast awareness. Pap and HR HPV as above. Guidelines for Calcium, Vitamin D, regular exercise program including cardiovascular and weight bearing exercise. I will take over her Rx for Effexor when she runs out.  I was previously prescribing this for her.  Prolia through oncology.  Follow up annually and prn.   After visit summary provided.

## 2018-04-02 ENCOUNTER — Ambulatory Visit (INDEPENDENT_AMBULATORY_CARE_PROVIDER_SITE_OTHER): Payer: Medicare Other | Admitting: Obstetrics and Gynecology

## 2018-04-02 ENCOUNTER — Other Ambulatory Visit: Payer: Self-pay

## 2018-04-02 ENCOUNTER — Encounter: Payer: Self-pay | Admitting: Obstetrics and Gynecology

## 2018-04-02 VITALS — BP 112/66 | HR 80 | Resp 16 | Ht 63.0 in | Wt 136.8 lb

## 2018-04-02 DIAGNOSIS — Z01419 Encounter for gynecological examination (general) (routine) without abnormal findings: Secondary | ICD-10-CM | POA: Diagnosis not present

## 2018-04-02 NOTE — Patient Instructions (Signed)

## 2018-05-21 ENCOUNTER — Other Ambulatory Visit: Payer: Self-pay | Admitting: Hematology and Oncology

## 2018-05-21 DIAGNOSIS — Z170 Estrogen receptor positive status [ER+]: Principal | ICD-10-CM

## 2018-05-21 DIAGNOSIS — C50211 Malignant neoplasm of upper-inner quadrant of right female breast: Secondary | ICD-10-CM

## 2018-06-04 NOTE — Progress Notes (Signed)
Patient Care Team: Asencion Noble, MD as PCP - General (Internal Medicine)  DIAGNOSIS:    ICD-10-CM   1. Malignant neoplasm of upper-inner quadrant of right breast in female, estrogen receptor positive ([place]) C50.211    Z17.0     SUMMARY OF ONCOLOGIC HISTORY:   Breast cancer of upper-inner quadrant of right female breast ([place])   12/11/2013 Breast MRI    Right breast upper inner quadrant 2.1 x 1.6 x 1.6 cm oval mass no abnormal lymph nodes    12/14/2013 Initial Diagnosis    Breast cancer of upper-inner quadrant of right female breast    12/23/2013 Surgery    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, 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, lymphovascular invasion is found, for sentinel lymph nodes negative ER 100% PR 100% HER-2 negative ratio 1.1, Ki-67 14%; Oncotype DX score of 15 (10% ROR)    02/15/2014 - 04/02/2014 Radiation Therapy    Adjuvant radiation therapy    04/06/2014 -  Anti-estrogen oral therapy    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 with above-mentioned history of right breast cancer treated with lumpectomy and radiation and is currently on anastrozole therapy because tamoxifen caused neuropathy. I last saw the patient one year ago. Her most recent mammogram from 01/15/18 showed no evidence of malignancy bilaterally. Her most recent bone density scan from 01/23/18 showed a T-score of -2.7. She presents to the clinic alone today. She notes mild, tolerable hot flashes. She denies any muscle aches or pain in her breasts. She denies any side effects of Prolia injections. She has been exercising regularly and line dances every week. She reviewed her medication list with me.   REVIEW OF SYSTEMS:   Constitutional: Denies fevers, chills or abnormal weight loss (+) hot flashes Eyes: Denies blurriness of vision Ears, nose,  mouth, throat, and face: Denies mucositis or sore throat Respiratory: Denies cough, dyspnea or wheezes Cardiovascular: Denies palpitation, chest discomfort Gastrointestinal: Denies nausea, heartburn or change in bowel habits Skin: Denies abnormal skin rashes Lymphatics: Denies new lymphadenopathy or easy bruising Neurological: Denies numbness, tingling or new weaknesses Behavioral/Psych: Mood is stable, no new changes  Extremities: No lower extremity edema Breast: denies any pain or lumps or nodules in either breasts All other systems were reviewed with the patient and are negative.  I have reviewed the past medical history, past surgical history, social history and family history with the patient and they are unchanged from previous note.  ALLERGIES:  is allergic to latex.  MEDICATIONS:  Current Outpatient Medications  Medication Sig Dispense Refill  . ALPRAZolam (XANAX) 1 MG tablet Take 1 tablet by mouth as needed.    Marland Kitchen anastrozole (ARIMIDEX) 1 MG tablet TAKE 1 TABLET BY MOUTH  DAILY 90 tablet 3  . denosumab (PROLIA) 60 MG/ML SOLN injection Inject 60 mg into the skin every 6 (six) months. Administer in upper arm, thigh, or abdomen    . fish oil-omega-3 fatty acids 1000 MG capsule Take 2 g by mouth daily.     . Multiple Vitamin (MULTIVITAMIN) capsule Take 1 capsule by mouth daily.      Marland Kitchen tretinoin (RETIN-A) 0.1 % cream     . venlafaxine XR (EFFEXOR-XR) 37.5 MG 24 hr capsule TAKE 1 CAPSULE BY MOUTH  DAILY WITH BREAKFAST 90 capsule 3   No current facility-administered medications for this  visit.     PHYSICAL EXAMINATION: ECOG PERFORMANCE STATUS: 1 - Symptomatic but completely ambulatory  Vitals:   06/10/18 1003  BP: (!) 147/82  Pulse: 75  Resp: 18  Temp: 98.4 F (36.9 C)  SpO2: 100%   Filed Weights   06/10/18 1003  Weight: 139 lb 6.4 oz (63.2 kg)    GENERAL: alert, no distress and comfortable SKIN: skin color, texture, turgor are normal, no rashes or significant  lesions EYES: normal, Conjunctiva are pink and non-injected, sclera clear OROPHARYNX: no exudate, no erythema and lips, buccal mucosa, and tongue normal  NECK: supple, thyroid normal size, non-tender, without nodularity LYMPH: no palpable lymphadenopathy in the cervical, axillary or inguinal LUNGS: clear to auscultation and percussion with normal breathing effort HEART: regular rate & rhythm and no murmurs and no lower extremity edema ABDOMEN: abdomen soft, non-tender and normal bowel sounds MUSCULOSKELETAL: no cyanosis of digits and no clubbing  NEURO: alert & oriented x 3 with fluent speech, no focal motor/sensory deficits EXTREMITIES: No lower extremity edema BREAST: No palpable masses or nodules in either right or left breasts. No palpable axillary supraclavicular or infraclavicular adenopathy no breast tenderness or nipple discharge. (exam performed in the presence of a chaperone)   LABORATORY DATA:  I have reviewed the data as listed CMP Latest Ref Rng & Units 12/17/2017 06/11/2017 12/11/2016  Glucose 70 - 99 mg/dL 80 68(L) 63(L)  BUN 8 - 23 mg/dL 15 18 17.3  Creatinine 0.44 - 1.00 mg/dL 0.77 0.74 0.8  Sodium 135 - 145 mmol/L 144 141 141  Potassium 3.5 - 5.1 mmol/L 4.3 4.4 4.4  Chloride 98 - 111 mmol/L 109 108 -  CO2 22 - 32 mmol/L _0 Calcium 8.9 - 10.3 mg/dL 9.6 9.5 9.7  Total Protein 6.5 - 8.1 g/dL 6.9 6.9 6.9  Total Bilirubin 0.3 - 1.2 mg/dL 0.3 0.4 0.43  Alkaline Phos 38 - 126 U/L 54 55 56  AST 15 - 41 U/L _1 ALT 0 - 44 U/L _2 Lab Results  Component Value Date   WBC 4.4 06/10/2018   HGB 13.5 06/10/2018   HCT 39.5 06/10/2018   MCV 102.3 (H) 06/10/2018   PLT 210 06/10/2018   NEUTROABS 2.0 06/10/2018    ASSESSMENT & PLAN:  Breast cancer of upper-inner quadrant of right female breast 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  Anastrozole toxicities: Does not have any further neuropathy/myalgias related to prior tamoxifen therapy Occasional hot flashes well controlled with Effexor  Osteoporosis: Patient had T score of -2.5 and -2.6. On Prolia every 6 months, Bone density 01/23/2018: T score -2.7  Surveillance: 1.  Breast exam 06/10/2018: No palpable lumps or nodules of concern 2. mammogram: 01/15/2018: Benign breast density category C  Return to clinic in1 yearfor follow-up to coordinate with her prolia injection    No orders of the defined types were placed in this encounter.  The patient has a good understanding of the overall plan. she agrees with it. she will call with any problems that may develop before the next visit here.  Nicholas Lose, MD 06/10/2018  Julious Oka Dorshimer am acting as scribe for Dr. Loleta Dicker  [place].  I have reviewed the above documentation for accuracy and completeness, and I agree with the above.

## 2018-06-09 ENCOUNTER — Other Ambulatory Visit: Payer: Self-pay

## 2018-06-09 DIAGNOSIS — Z170 Estrogen receptor positive status [ER+]: Principal | ICD-10-CM

## 2018-06-09 DIAGNOSIS — C50211 Malignant neoplasm of upper-inner quadrant of right female breast: Secondary | ICD-10-CM

## 2018-06-10 ENCOUNTER — Inpatient Hospital Stay: Payer: Medicare Other | Admitting: Hematology and Oncology

## 2018-06-10 ENCOUNTER — Telehealth: Payer: Self-pay | Admitting: Hematology and Oncology

## 2018-06-10 ENCOUNTER — Inpatient Hospital Stay: Payer: Medicare Other | Attending: Hematology and Oncology

## 2018-06-10 ENCOUNTER — Inpatient Hospital Stay: Payer: Medicare Other

## 2018-06-10 VITALS — BP 143/72 | HR 79 | Temp 98.1°F | Resp 18

## 2018-06-10 DIAGNOSIS — Z79811 Long term (current) use of aromatase inhibitors: Secondary | ICD-10-CM | POA: Diagnosis not present

## 2018-06-10 DIAGNOSIS — Z170 Estrogen receptor positive status [ER+]: Secondary | ICD-10-CM | POA: Insufficient documentation

## 2018-06-10 DIAGNOSIS — Z79899 Other long term (current) drug therapy: Secondary | ICD-10-CM | POA: Insufficient documentation

## 2018-06-10 DIAGNOSIS — Z923 Personal history of irradiation: Secondary | ICD-10-CM

## 2018-06-10 DIAGNOSIS — M810 Age-related osteoporosis without current pathological fracture: Secondary | ICD-10-CM

## 2018-06-10 DIAGNOSIS — C50211 Malignant neoplasm of upper-inner quadrant of right female breast: Secondary | ICD-10-CM | POA: Diagnosis present

## 2018-06-10 LAB — CMP (CANCER CENTER ONLY)
ALT: 23 U/L (ref 0–44)
AST: 18 U/L (ref 15–41)
Albumin: 3.8 g/dL (ref 3.5–5.0)
Alkaline Phosphatase: 58 U/L (ref 38–126)
Anion gap: 7 (ref 5–15)
BUN: 16 mg/dL (ref 8–23)
CO2: 27 mmol/L (ref 22–32)
Calcium: 9.2 mg/dL (ref 8.9–10.3)
Chloride: 107 mmol/L (ref 98–111)
Creatinine: 0.73 mg/dL (ref 0.44–1.00)
GFR, Est AFR Am: >60 mL/min (ref >60)
GFR, Estimated: >60 mL/min (ref >60)
Glucose, Bld: 70 mg/dL (ref 70–99)
Potassium: 4.4 mmol/L (ref 3.5–5.1)
Sodium: 141 mmol/L (ref 135–145)
Total Bilirubin: 0.4 mg/dL (ref 0.3–1.2)
Total Protein: 7.1 g/dL (ref 6.5–8.1)

## 2018-06-10 LAB — CBC WITH DIFFERENTIAL (CANCER CENTER ONLY)
Abs Immature Granulocytes: 0.01 10*3/uL (ref 0.00–0.07)
Basophils Absolute: 0.1 10*3/uL (ref 0.0–0.1)
Basophils Relative: 1 %
Eosinophils Absolute: 0.1 10*3/uL (ref 0.0–0.5)
Eosinophils Relative: 2 %
HCT: 39.5 % (ref 36.0–46.0)
Hemoglobin: 13.5 g/dL (ref 12.0–15.0)
Immature Granulocytes: 0 %
Lymphocytes Relative: 44 %
Lymphs Abs: 2 10*3/uL (ref 0.7–4.0)
MCH: 35 pg — AB (ref 26.0–34.0)
MCHC: 34.2 g/dL (ref 30.0–36.0)
MCV: 102.3 fL — ABNORMAL HIGH (ref 80.0–100.0)
Monocytes Absolute: 0.3 10*3/uL (ref 0.1–1.0)
Monocytes Relative: 7 %
Neutro Abs: 2 10*3/uL (ref 1.7–7.7)
Neutrophils Relative %: 46 %
Platelet Count: 210 10*3/uL (ref 150–400)
RBC: 3.86 MIL/uL — ABNORMAL LOW (ref 3.87–5.11)
RDW: 11.8 % (ref 11.5–15.5)
WBC Count: 4.4 10*3/uL (ref 4.0–10.5)
nRBC: 0 % (ref 0.0–0.2)

## 2018-06-10 MED ORDER — DENOSUMAB 60 MG/ML ~~LOC~~ SOSY
PREFILLED_SYRINGE | SUBCUTANEOUS | Status: AC
  Filled 2018-06-10: qty 1

## 2018-06-10 MED ORDER — DENOSUMAB 60 MG/ML ~~LOC~~ SOSY
60.0000 mg | PREFILLED_SYRINGE | Freq: Once | SUBCUTANEOUS | Status: AC
  Administered 2018-06-10: 60 mg via SUBCUTANEOUS

## 2018-06-10 NOTE — Patient Instructions (Signed)

## 2018-06-10 NOTE — Telephone Encounter (Signed)
Gave avs and calendar ° °

## 2018-06-10 NOTE — Assessment & Plan Note (Addendum)
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  Anastrozole toxicities: Does not have any further neuropathy/myalgias related to prior tamoxifen therapy Occasional hot flashes well controlled with Effexor  Osteoporosis: Patient had T score of -2.5 and -2.6. On Prolia every 6 months, Bone density 01/23/2018: T score -2.7  Surveillance: 1.  Breast exam 06/10/2018: No palpable lumps or nodules of concern 2. mammogram: 01/15/2018: Benign breast density category C  Return to clinic in1 yearfor follow-up to coordinate with her prolia injection

## 2018-07-15 ENCOUNTER — Other Ambulatory Visit: Payer: Self-pay | Admitting: Hematology and Oncology

## 2018-12-03 ENCOUNTER — Other Ambulatory Visit: Payer: Self-pay | Admitting: Hematology and Oncology

## 2018-12-03 ENCOUNTER — Telehealth: Payer: Self-pay | Admitting: Hematology and Oncology

## 2018-12-03 ENCOUNTER — Other Ambulatory Visit: Payer: Self-pay | Admitting: Adult Health

## 2018-12-03 NOTE — Telephone Encounter (Signed)
Called pt per 8/12 sch message - unable to reach pt - left  Message with new appt date and time

## 2018-12-04 NOTE — Assessment & Plan Note (Signed)
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  Anastrozole toxicities: Does not have any further neuropathy/myalgias related to prior tamoxifen therapy Occasional hot flashes well controlled with Effexor  Osteoporosis: Patient had T score of -2.5 and -2.6. On Prolia every 6 months, Bone density 01/23/2018: T score -2.7  Surveillance: 1.Breast exam 06/10/2018: No palpable lumps or nodules of concern 2.mammogram: 01/15/2018: Benign breast density category C  Return to clinic in1 yearfor follow-up to coordinate with her prolia injection.  She will need Prolia every 6 months.

## 2018-12-05 ENCOUNTER — Other Ambulatory Visit: Payer: Self-pay | Admitting: Hematology and Oncology

## 2018-12-05 ENCOUNTER — Telehealth: Payer: Self-pay | Admitting: Obstetrics and Gynecology

## 2018-12-05 DIAGNOSIS — Z853 Personal history of malignant neoplasm of breast: Secondary | ICD-10-CM

## 2018-12-05 DIAGNOSIS — Z9889 Other specified postprocedural states: Secondary | ICD-10-CM

## 2018-12-05 NOTE — Telephone Encounter (Signed)
Call to patient. Advised patient last BMD was 01-23-18. Patient states she was unsure the date- states she usually has them every 2 years. Receives prolia injections through oncology. Patient appreciative of phone call.   Routing to provider and will close encounter.

## 2018-12-05 NOTE — Telephone Encounter (Signed)
Patient want to check if she is due for a bone density this year.

## 2018-12-08 NOTE — Progress Notes (Signed)
Patient Care Team: Asencion Noble, MD as PCP - General (Internal Medicine)  DIAGNOSIS:    ICD-10-CM   1. Malignant neoplasm of upper-inner quadrant of right breast in female, estrogen receptor positive ([place])  C50.211    Z17.0     SUMMARY OF ONCOLOGIC HISTORY: Oncology History  Breast cancer of upper-inner quadrant of right female breast ([place])  12/11/2013 Breast MRI   Right breast upper inner quadrant 2.1 x 1.6 x 1.6 cm oval mass no abnormal lymph nodes   12/14/2013 Initial Diagnosis   Breast cancer of upper-inner quadrant of right female breast   12/23/2013 Surgery   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, 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, lymphovascular invasion is found, for sentinel lymph nodes negative ER 100% PR 100% HER-2 negative ratio 1.1, Ki-67 14%; Oncotype DX score of 15 (10% ROR)   02/15/2014 - 04/02/2014 Radiation Therapy   Adjuvant radiation therapy   04/06/2014 -  Anti-estrogen oral therapy   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 with above-mentioned history of right breast cancer treated with lumpectomy, radiation, and who is currently on anastrozole therapy because tamoxifen caused neuropathy. I last saw her 6 months ago. She presents to the clinic today for follow-up.   REVIEW OF SYSTEMS:   Constitutional: Denies fevers, chills or abnormal weight loss Eyes: Denies blurriness of vision Ears, nose, mouth, throat, and face: Denies mucositis or sore throat Respiratory: Denies cough, dyspnea or wheezes Cardiovascular: Denies palpitation, chest discomfort Gastrointestinal: Denies nausea, heartburn or change in bowel habits Skin: Denies abnormal skin rashes Lymphatics: Denies new lymphadenopathy or easy bruising Neurological: Denies numbness, tingling or new weaknesses  Behavioral/Psych: Mood is stable, no new changes  Extremities: No lower extremity edema Breast: denies any pain or lumps or nodules in either breasts All other systems were reviewed with the patient and are negative.  I have reviewed the past medical history, past surgical history, social history and family history with the patient and they are unchanged from previous note.  ALLERGIES:  is allergic to latex.  MEDICATIONS:  Current Outpatient Medications  Medication Sig Dispense Refill  . ALPRAZolam (XANAX) 1 MG tablet Take 1 tablet by mouth as needed.    Marland Kitchen anastrozole (ARIMIDEX) 1 MG tablet TAKE 1 TABLET BY MOUTH  DAILY 90 tablet 3  . denosumab (PROLIA) 60 MG/ML SOLN injection Inject 60 mg into the skin every 6 (six) months. Administer in upper arm, thigh, or abdomen    . fish oil-omega-3 fatty acids 1000 MG capsule Take 2 g by mouth daily.     . Multiple Vitamin (MULTIVITAMIN) capsule Take 1 capsule by mouth daily.      Marland Kitchen tretinoin (RETIN-A) 0.1 % cream     . venlafaxine XR (EFFEXOR-XR) 37.5 MG 24 hr capsule TAKE 1 CAPSULE BY MOUTH  DAILY WITH BREAKFAST 90 capsule 3   No current facility-administered medications for this visit.     PHYSICAL EXAMINATION: ECOG PERFORMANCE STATUS: 1 - Symptomatic but completely ambulatory  Vitals:   12/09/18 1522  BP: (!) 144/68  Pulse: 76  Resp: 20  Temp: (!) 97.4 F (36.3 C)  SpO2: 100%   Filed Weights   12/09/18 1522  Weight: 138 lb (62.6 kg)    GENERAL: alert, no distress and comfortable SKIN: skin color, texture, turgor are normal, no  rashes or significant lesions EYES: normal, Conjunctiva are pink and non-injected, sclera clear OROPHARYNX: no exudate, no erythema and lips, buccal mucosa, and tongue normal  NECK: supple, thyroid normal size, non-tender, without nodularity LYMPH: no palpable lymphadenopathy in the cervical, axillary or inguinal LUNGS: clear to auscultation and percussion with normal breathing effort HEART: regular  rate & rhythm and no murmurs and no lower extremity edema ABDOMEN: abdomen soft, non-tender and normal bowel sounds MUSCULOSKELETAL: no cyanosis of digits and no clubbing  NEURO: alert & oriented x 3 with fluent speech, no focal motor/sensory deficits EXTREMITIES: No lower extremity edema  LABORATORY DATA:  I have reviewed the data as listed CMP Latest Ref Rng & Units 06/10/2018 12/17/2017 06/11/2017  Glucose 70 - 99 mg/dL 70 80 68(L)  BUN 8 - 23 mg/dL _0 Creatinine 0.44 - 1.00 mg/dL 0.73 0.77 0.74  Sodium 135 - 145 mmol/L 141 144 141  Potassium 3.5 - 5.1 mmol/L 4.4 4.3 4.4  Chloride 98 - 111 mmol/L 107 109 108  CO2 22 - 32 mmol/L _1 Calcium 8.9 - 10.3 mg/dL 9.2 9.6 9.5  Total Protein 6.5 - 8.1 g/dL 7.1 6.9 6.9  Total Bilirubin 0.3 - 1.2 mg/dL 0.4 0.3 0.4  Alkaline Phos 38 - 126 U/L 58 54 55  AST 15 - 41 U/L _2 ALT 0 - 44 U/L _3 Lab Results  Component Value Date   WBC 7.8 12/09/2018   HGB 12.6 12/09/2018   HCT 36.9 12/09/2018   MCV 101.9 (H) 12/09/2018   PLT 222 12/09/2018   NEUTROABS 4.8 12/09/2018    ASSESSMENT & PLAN:  Breast cancer of upper-inner quadrant of right female breast 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 Plan to treat till Dec 2022  Anastrozole toxicities: Does not have any further neuropathy/myalgias related to prior tamoxifen therapy Occasional hot flashes well controlled with Effexor  Osteoporosis: Patient had T score of -2.5 and -2.6. On Prolia every 6 months, Bone density 01/23/2018: T score -2.7  Surveillance: 1.Breast exam 06/10/2018: No palpable lumps or nodules of concern 2.mammogram:  01/15/2018: Benign breast density category C  Return to clinic in1 yearfor follow-up to coordinate with her prolia injection.  She will need Prolia every 6 months.    No orders of the defined types were placed in this encounter.  The patient has a good understanding of the overall plan. she agrees with it. she will call with any problems that may develop before the next visit here.  Nicholas Lose, MD 12/09/2018  Julious Oka Dorshimer am acting as scribe for Dr. Nicholas Lose.  I have reviewed the above documentation for accuracy and completeness, and I agree with the above.

## 2018-12-09 ENCOUNTER — Inpatient Hospital Stay: Payer: Medicare Other | Attending: Hematology and Oncology

## 2018-12-09 ENCOUNTER — Inpatient Hospital Stay: Payer: Medicare Other

## 2018-12-09 ENCOUNTER — Other Ambulatory Visit: Payer: Self-pay

## 2018-12-09 ENCOUNTER — Inpatient Hospital Stay: Payer: Medicare Other | Admitting: Hematology and Oncology

## 2018-12-09 ENCOUNTER — Ambulatory Visit: Payer: Medicare Other

## 2018-12-09 DIAGNOSIS — Z79811 Long term (current) use of aromatase inhibitors: Secondary | ICD-10-CM | POA: Diagnosis not present

## 2018-12-09 DIAGNOSIS — Z170 Estrogen receptor positive status [ER+]: Secondary | ICD-10-CM | POA: Diagnosis not present

## 2018-12-09 DIAGNOSIS — M810 Age-related osteoporosis without current pathological fracture: Secondary | ICD-10-CM | POA: Diagnosis not present

## 2018-12-09 DIAGNOSIS — G629 Polyneuropathy, unspecified: Secondary | ICD-10-CM | POA: Diagnosis not present

## 2018-12-09 DIAGNOSIS — R232 Flushing: Secondary | ICD-10-CM | POA: Diagnosis not present

## 2018-12-09 DIAGNOSIS — Z79899 Other long term (current) drug therapy: Secondary | ICD-10-CM | POA: Diagnosis not present

## 2018-12-09 DIAGNOSIS — Z923 Personal history of irradiation: Secondary | ICD-10-CM | POA: Insufficient documentation

## 2018-12-09 DIAGNOSIS — C50211 Malignant neoplasm of upper-inner quadrant of right female breast: Secondary | ICD-10-CM

## 2018-12-09 LAB — CMP (CANCER CENTER ONLY)
ALT: 18 U/L (ref 0–44)
AST: 15 U/L (ref 15–41)
Albumin: 3.8 g/dL (ref 3.5–5.0)
Alkaline Phosphatase: 49 U/L (ref 38–126)
Anion gap: 10 (ref 5–15)
BUN: 20 mg/dL (ref 8–23)
CO2: 23 mmol/L (ref 22–32)
Calcium: 9.1 mg/dL (ref 8.9–10.3)
Chloride: 108 mmol/L (ref 98–111)
Creatinine: 0.77 mg/dL (ref 0.44–1.00)
GFR, Est AFR Am: >60 mL/min (ref >60)
GFR, Estimated: >60 mL/min (ref >60)
Glucose, Bld: 103 mg/dL — ABNORMAL HIGH (ref 70–99)
Potassium: 4.2 mmol/L (ref 3.5–5.1)
Sodium: 141 mmol/L (ref 135–145)
Total Bilirubin: 0.2 mg/dL — ABNORMAL LOW (ref 0.3–1.2)
Total Protein: 6.9 g/dL (ref 6.5–8.1)

## 2018-12-09 LAB — CBC WITH DIFFERENTIAL (CANCER CENTER ONLY)
Abs Immature Granulocytes: 0.01 10*3/uL (ref 0.00–0.07)
Basophils Absolute: 0 10*3/uL (ref 0.0–0.1)
Basophils Relative: 1 %
Eosinophils Absolute: 0.2 10*3/uL (ref 0.0–0.5)
Eosinophils Relative: 3 %
HCT: 36.9 % (ref 36.0–46.0)
Hemoglobin: 12.6 g/dL (ref 12.0–15.0)
Immature Granulocytes: 0 %
Lymphocytes Relative: 28 %
Lymphs Abs: 2.2 10*3/uL (ref 0.7–4.0)
MCH: 34.8 pg — ABNORMAL HIGH (ref 26.0–34.0)
MCHC: 34.1 g/dL (ref 30.0–36.0)
MCV: 101.9 fL — ABNORMAL HIGH (ref 80.0–100.0)
Monocytes Absolute: 0.5 10*3/uL (ref 0.1–1.0)
Monocytes Relative: 7 %
Neutro Abs: 4.8 10*3/uL (ref 1.7–7.7)
Neutrophils Relative %: 61 %
Platelet Count: 222 10*3/uL (ref 150–400)
RBC: 3.62 MIL/uL — ABNORMAL LOW (ref 3.87–5.11)
RDW: 11.8 % (ref 11.5–15.5)
WBC Count: 7.8 10*3/uL (ref 4.0–10.5)
nRBC: 0 % (ref 0.0–0.2)

## 2018-12-09 MED ORDER — SODIUM CHLORIDE 0.9 % IV SOLN
Freq: Once | INTRAVENOUS | Status: AC
  Administered 2018-12-09: 16:00:00 via INTRAVENOUS
  Filled 2018-12-09: qty 250

## 2018-12-09 MED ORDER — ZOLEDRONIC ACID 4 MG/100ML IV SOLN
4.0000 mg | Freq: Once | INTRAVENOUS | Status: AC
  Administered 2018-12-09: 4 mg via INTRAVENOUS
  Filled 2018-12-09: qty 100

## 2018-12-09 NOTE — Patient Instructions (Signed)
Zoledronic Acid injection (Hypercalcemia, Oncology) What is this medicine? ZOLEDRONIC ACID (ZOE le dron ik AS id) lowers the amount of calcium loss from bone. It is used to treat too much calcium in your blood from cancer. It is also used to prevent complications of cancer that has spread to the bone. This medicine may be used for other purposes; ask your health care provider or pharmacist if you have questions. COMMON BRAND NAME(S): Zometa What should I tell my health care provider before I take this medicine? They need to know if you have any of these conditions:  aspirin-sensitive asthma  cancer, especially if you are receiving medicines used to treat cancer  dental disease or wear dentures  infection  kidney disease  receiving corticosteroids like dexamethasone or prednisone  an unusual or allergic reaction to zoledronic acid, other medicines, foods, dyes, or preservatives  pregnant or trying to get pregnant  breast-feeding How should I use this medicine? This medicine is for infusion into a vein. It is given by a health care professional in a hospital or clinic setting. Talk to your pediatrician regarding the use of this medicine in children. Special care may be needed. Overdosage: If you think you have taken too much of this medicine contact a poison control center or emergency room at once. NOTE: This medicine is only for you. Do not share this medicine with others. What if I miss a dose? It is important not to miss your dose. Call your doctor or health care professional if you are unable to keep an appointment. What may interact with this medicine?  certain antibiotics given by injection  NSAIDs, medicines for pain and inflammation, like ibuprofen or naproxen  some diuretics like bumetanide, furosemide  teriparatide  thalidomide This list may not describe all possible interactions. Give your health care provider a list of all the medicines, herbs, non-prescription  drugs, or dietary supplements you use. Also tell them if you smoke, drink alcohol, or use illegal drugs. Some items may interact with your medicine. What should I watch for while using this medicine? Visit your doctor or health care professional for regular checkups. It may be some time before you see the benefit from this medicine. Do not stop taking your medicine unless your doctor tells you to. Your doctor may order blood tests or other tests to see how you are doing. Women should inform their doctor if they wish to become pregnant or think they might be pregnant. There is a potential for serious side effects to an unborn child. Talk to your health care professional or pharmacist for more information. You should make sure that you get enough calcium and vitamin D while you are taking this medicine. Discuss the foods you eat and the vitamins you take with your health care professional. Some people who take this medicine have severe bone, joint, and/or muscle pain. This medicine may also increase your risk for jaw problems or a broken thigh bone. Tell your doctor right away if you have severe pain in your jaw, bones, joints, or muscles. Tell your doctor if you have any pain that does not go away or that gets worse. Tell your dentist and dental surgeon that you are taking this medicine. You should not have major dental surgery while on this medicine. See your dentist to have a dental exam and fix any dental problems before starting this medicine. Take good care of your teeth while on this medicine. Make sure you see your dentist for regular follow-up   appointments. What side effects may I notice from receiving this medicine? Side effects that you should report to your doctor or health care professional as soon as possible:  allergic reactions like skin rash, itching or hives, swelling of the face, lips, or tongue  anxiety, confusion, or depression  breathing problems  changes in vision  eye  pain  feeling faint or lightheaded, falls  jaw pain, especially after dental work  mouth sores  muscle cramps, stiffness, or weakness  redness, blistering, peeling or loosening of the skin, including inside the mouth  trouble passing urine or change in the amount of urine Side effects that usually do not require medical attention (report to your doctor or health care professional if they continue or are bothersome):  bone, joint, or muscle pain  constipation  diarrhea  fever  hair loss  irritation at site where injected  loss of appetite  nausea, vomiting  stomach upset  trouble sleeping  trouble swallowing  weak or tired This list may not describe all possible side effects. Call your doctor for medical advice about side effects. You may report side effects to FDA at 1-800-FDA-1088. Where should I keep my medicine? This drug is given in a hospital or clinic and will not be stored at home. NOTE: This sheet is a summary. It may not cover all possible information. If you have questions about this medicine, talk to your doctor, pharmacist, or health care provider.  2020 Elsevier/Gold Standard (2013-09-05 14:19:39)  

## 2018-12-11 ENCOUNTER — Telehealth: Payer: Self-pay | Admitting: Hematology and Oncology

## 2018-12-11 NOTE — Telephone Encounter (Signed)
I talk with patient regarding schedule  

## 2019-01-20 ENCOUNTER — Ambulatory Visit
Admission: RE | Admit: 2019-01-20 | Discharge: 2019-01-20 | Disposition: A | Discharge status: Home / Self Care | Payer: Medicare Other | Source: Ambulatory Visit | Attending: Hematology and Oncology | Admitting: Hematology and Oncology

## 2019-01-20 ENCOUNTER — Other Ambulatory Visit: Payer: Self-pay

## 2019-01-20 DIAGNOSIS — Z853 Personal history of malignant neoplasm of breast: Secondary | ICD-10-CM

## 2019-01-20 DIAGNOSIS — Z9889 Other specified postprocedural states: Secondary | ICD-10-CM

## 2019-04-07 ENCOUNTER — Other Ambulatory Visit: Payer: Self-pay

## 2019-04-07 NOTE — Progress Notes (Signed)
73 y.o. G2P2 Married Caucasian female here for annual exam.    Denies vaginal bleeding, vaginal discharge, or pelvic pain.   PCP: Asencion Noble, MD    Patient's last menstrual period was 04/23/1993.           Sexually active: No.  The current method of family planning is tubal ligation.    Exercising: No.  some walking Smoker:  no  Health Maintenance: Pap: 03-06-17 Neg, 12-15-14 Neg History of abnormal Pap:  no MMG: 01-20-19 S/P Rt.lumpectomy--Diag.Bil./Neg/density C/BiRads2/screening 52yr. Colonoscopy: 2013 normal;next 2023 BMD:   01-23-18  Result:  Osteoporosis.  Receiving Reclast.  TDaP:  PCP Gardasil:   no HIV:no Hep C:no Screening Labs:  PCP.  Flu vaccine:  Completed.    reports that she quit smoking about 45 years ago. She has a 2.50 pack-year smoking history. She has never used smokeless tobacco. She reports that she does not drink alcohol or use drugs.  Past Medical History:  Diagnosis Date  . Breast cancer, right breast [facility]) August 2015   lumpectomy and radiation therapy  . Fibroids 12/2008   history of  . Osteoporosis   . Personal history of radiation therapy   . Skin cancer    basal cell    Past Surgical History:  Procedure Laterality Date  . APPENDECTOMY    . BREAST EXCISIONAL BIOPSY Right   . BREAST LUMPECTOMY Right 2015  . BREAST SURGERY  2015   right lumpectomy for breast cancer  . COLONOSCOPY  10 years ago   dr Laural Golden  . COLONOSCOPY  12/07/2010   Procedure: COLONOSCOPY;  Surgeon: Rogene Houston, MD;  [facility];  Service: Endoscopy;  Laterality: N/A;  . TOE ARTHROSCOPY  2005   rt great toe spurs  . TONSILLECTOMY    . TUBAL LIGATION  1976  . VAGINAL DELIVERY     times 2    Current Outpatient Medications  Medication Sig Dispense Refill  . ALPRAZolam (XANAX) 1 MG tablet Take 1 tablet by mouth as needed.    Marland Kitchen anastrozole (ARIMIDEX) 1 MG tablet TAKE 1 TABLET BY MOUTH  DAILY 90 tablet 3  . fish oil-omega-3 fatty acids 1000 MG capsule  Take 2 g by mouth daily.     . Multiple Vitamin (MULTIVITAMIN) capsule Take 1 capsule by mouth daily.      Marland Kitchen tretinoin (RETIN-A) 0.1 % cream     . venlafaxine XR (EFFEXOR-XR) 37.5 MG 24 hr capsule TAKE 1 CAPSULE BY MOUTH  DAILY WITH BREAKFAST 90 capsule 3   No current facility-administered medications for this visit.    Family History  Problem Relation Age of Onset  . Diabetes Brother   . Heart failure Mother   . Aneurysm Mother        abdominal  . Cancer Maternal Grandfather     Review of Systems  All other systems reviewed and are negative.   Exam:   BP 138/84   Pulse 84   Temp (!) 97 F (36.1 C) (Temporal)   Resp 14   Ht 5' 3.5" (1.613 m)   Wt 138 lb (62.6 kg)   LMP 04/23/1993   BMI 24.06 kg/m     General appearance: alert, cooperative and appears stated age Head: normocephalic, without obvious abnormality, atraumatic Neck: no adenopathy, supple, symmetrical, trachea midline and thyroid normal to inspection and palpation Lungs: clear to auscultation bilaterally Breasts: normal appearance, no masses or tenderness, No nipple retraction or dimpling, No nipple discharge or bleeding, No axillary  adenopathy Heart: regular rate and rhythm Abdomen: soft, non-tender; no masses, no organomegaly Extremities: extremities normal, atraumatic, no cyanosis or edema Skin: skin color, texture, turgor normal. No rashes or lesions Lymph nodes: cervical, supraclavicular, and axillary nodes normal. Neurologic: grossly normal  Pelvic: External genitalia:  no lesions              No abnormal inguinal nodes palpated.              Urethra:  normal appearing urethra with no masses, tenderness or lesions              Bartholins and Skenes: normal                 Vagina: normal appearing vagina with normal color and discharge, no lesions              Cervix: no lesions              Pap taken: Yes.   Bimanual Exam:  Uterus:  normal size, contour, position, consistency, mobility, non-tender               Adnexa: no mass, fullness, tenderness              Rectal exam: Yes.  .  Confirms.              Anus:  normal sphincter tone, no lesions  Chaperone was present for exam.  Assessment:   Well woman visit with normal exam. Right breast cancer. Current Arimidex tx.  Osteoporosis. On Reclast. Menopausal symptoms treated with Effexor.  Plan: Mammogram screening discussed. Self breast awareness reviewed. Pap and HR HPV as above. Guidelines for Calcium, Vitamin D, regular exercise program including cardiovascular and weight bearing exercise.   Follow up annually and prn.   After visit summary provided.

## 2019-04-08 ENCOUNTER — Ambulatory Visit (INDEPENDENT_AMBULATORY_CARE_PROVIDER_SITE_OTHER): Payer: Medicare Other | Admitting: Obstetrics and Gynecology

## 2019-04-08 ENCOUNTER — Encounter: Payer: Self-pay | Admitting: Obstetrics and Gynecology

## 2019-04-08 ENCOUNTER — Other Ambulatory Visit (HOSPITAL_COMMUNITY)
Admission: RE | Admit: 2019-04-08 | Discharge: 2019-04-08 | Disposition: A | Discharge status: Home / Self Care | Payer: Medicare Other | Source: Ambulatory Visit | Attending: Obstetrics and Gynecology | Admitting: Obstetrics and Gynecology

## 2019-04-08 VITALS — BP 138/84 | HR 84 | Temp 97.0°F | Resp 14 | Ht 63.5 in | Wt 138.0 lb

## 2019-04-08 DIAGNOSIS — Z01419 Encounter for gynecological examination (general) (routine) without abnormal findings: Secondary | ICD-10-CM

## 2019-04-08 NOTE — Patient Instructions (Signed)

## 2019-04-09 LAB — CYTOLOGY - PAP: Diagnosis: NEGATIVE

## 2019-04-23 ENCOUNTER — Other Ambulatory Visit: Payer: Self-pay | Admitting: Hematology and Oncology

## 2019-04-23 DIAGNOSIS — Z170 Estrogen receptor positive status [ER+]: Secondary | ICD-10-CM

## 2019-04-23 DIAGNOSIS — C50211 Malignant neoplasm of upper-inner quadrant of right female breast: Secondary | ICD-10-CM

## 2019-06-11 ENCOUNTER — Ambulatory Visit: Payer: Medicare Other

## 2019-06-11 ENCOUNTER — Inpatient Hospital Stay: Payer: Medicare PPO

## 2019-06-11 ENCOUNTER — Ambulatory Visit: Payer: Medicare Other | Admitting: Hematology and Oncology

## 2019-06-11 ENCOUNTER — Ambulatory Visit: Payer: Self-pay

## 2019-06-17 ENCOUNTER — Inpatient Hospital Stay: Payer: Medicare PPO | Attending: Hematology and Oncology

## 2019-06-17 ENCOUNTER — Inpatient Hospital Stay: Payer: Medicare PPO

## 2019-06-17 ENCOUNTER — Other Ambulatory Visit: Payer: Self-pay

## 2019-06-17 VITALS — BP 147/81 | HR 71 | Temp 98.2°F | Resp 18

## 2019-06-17 DIAGNOSIS — C50411 Malignant neoplasm of upper-outer quadrant of right female breast: Secondary | ICD-10-CM | POA: Diagnosis not present

## 2019-06-17 DIAGNOSIS — M810 Age-related osteoporosis without current pathological fracture: Secondary | ICD-10-CM

## 2019-06-17 DIAGNOSIS — Z170 Estrogen receptor positive status [ER+]: Secondary | ICD-10-CM | POA: Diagnosis not present

## 2019-06-17 DIAGNOSIS — Z79899 Other long term (current) drug therapy: Secondary | ICD-10-CM | POA: Insufficient documentation

## 2019-06-17 LAB — BASIC METABOLIC PANEL - CANCER CENTER ONLY
Anion gap: 11 (ref 5–15)
BUN: 22 mg/dL (ref 8–23)
CO2: 25 mmol/L (ref 22–32)
Calcium: 9.5 mg/dL (ref 8.9–10.3)
Chloride: 107 mmol/L (ref 98–111)
Creatinine: 0.74 mg/dL (ref 0.44–1.00)
GFR, Est AFR Am: >60 mL/min (ref >60)
GFR, Estimated: >60 mL/min (ref >60)
Glucose, Bld: 87 mg/dL (ref 70–99)
Potassium: 4.4 mmol/L (ref 3.5–5.1)
Sodium: 143 mmol/L (ref 135–145)

## 2019-06-17 MED ORDER — SODIUM CHLORIDE 0.9 % IV SOLN
Freq: Once | INTRAVENOUS | Status: AC
  Filled 2019-06-17: qty 250

## 2019-06-17 MED ORDER — ZOLEDRONIC ACID 4 MG/100ML IV SOLN
INTRAVENOUS | Status: AC
  Filled 2019-06-17: qty 100

## 2019-06-17 MED ORDER — ZOLEDRONIC ACID 4 MG/100ML IV SOLN
4.0000 mg | Freq: Once | INTRAVENOUS | Status: AC
  Administered 2019-06-17: 4 mg via INTRAVENOUS

## 2019-06-17 NOTE — Patient Instructions (Signed)
Zoledronic Acid injection (Hypercalcemia, Oncology) What is this medicine? ZOLEDRONIC ACID (ZOE le dron ik AS id) lowers the amount of calcium loss from bone. It is used to treat too much calcium in your blood from cancer. It is also used to prevent complications of cancer that has spread to the bone. This medicine may be used for other purposes; ask your health care provider or pharmacist if you have questions. COMMON BRAND NAME(S): Zometa What should I tell my health care provider before I take this medicine? They need to know if you have any of these conditions:  aspirin-sensitive asthma  cancer, especially if you are receiving medicines used to treat cancer  dental disease or wear dentures  infection  kidney disease  receiving corticosteroids like dexamethasone or prednisone  an unusual or allergic reaction to zoledronic acid, other medicines, foods, dyes, or preservatives  pregnant or trying to get pregnant  breast-feeding How should I use this medicine? This medicine is for infusion into a vein. It is given by a health care professional in a hospital or clinic setting. Talk to your pediatrician regarding the use of this medicine in children. Special care may be needed. Overdosage: If you think you have taken too much of this medicine contact a poison control center or emergency room at once. NOTE: This medicine is only for you. Do not share this medicine with others. What if I miss a dose? It is important not to miss your dose. Call your doctor or health care professional if you are unable to keep an appointment. What may interact with this medicine?  certain antibiotics given by injection  NSAIDs, medicines for pain and inflammation, like ibuprofen or naproxen  some diuretics like bumetanide, furosemide  teriparatide  thalidomide This list may not describe all possible interactions. Give your health care provider a list of all the medicines, herbs, non-prescription  drugs, or dietary supplements you use. Also tell them if you smoke, drink alcohol, or use illegal drugs. Some items may interact with your medicine. What should I watch for while using this medicine? Visit your doctor or health care professional for regular checkups. It may be some time before you see the benefit from this medicine. Do not stop taking your medicine unless your doctor tells you to. Your doctor may order blood tests or other tests to see how you are doing. Women should inform their doctor if they wish to become pregnant or think they might be pregnant. There is a potential for serious side effects to an unborn child. Talk to your health care professional or pharmacist for more information. You should make sure that you get enough calcium and vitamin D while you are taking this medicine. Discuss the foods you eat and the vitamins you take with your health care professional. Some people who take this medicine have severe bone, joint, and/or muscle pain. This medicine may also increase your risk for jaw problems or a broken thigh bone. Tell your doctor right away if you have severe pain in your jaw, bones, joints, or muscles. Tell your doctor if you have any pain that does not go away or that gets worse. Tell your dentist and dental surgeon that you are taking this medicine. You should not have major dental surgery while on this medicine. See your dentist to have a dental exam and fix any dental problems before starting this medicine. Take good care of your teeth while on this medicine. Make sure you see your dentist for regular follow-up   appointments. What side effects may I notice from receiving this medicine? Side effects that you should report to your doctor or health care professional as soon as possible:  allergic reactions like skin rash, itching or hives, swelling of the face, lips, or tongue  anxiety, confusion, or depression  breathing problems  changes in vision  eye  pain  feeling faint or lightheaded, falls  jaw pain, especially after dental work  mouth sores  muscle cramps, stiffness, or weakness  redness, blistering, peeling or loosening of the skin, including inside the mouth  trouble passing urine or change in the amount of urine Side effects that usually do not require medical attention (report to your doctor or health care professional if they continue or are bothersome):  bone, joint, or muscle pain  constipation  diarrhea  fever  hair loss  irritation at site where injected  loss of appetite  nausea, vomiting  stomach upset  trouble sleeping  trouble swallowing  weak or tired This list may not describe all possible side effects. Call your doctor for medical advice about side effects. You may report side effects to FDA at 1-800-FDA-1088. Where should I keep my medicine? This drug is given in a hospital or clinic and will not be stored at home. NOTE: This sheet is a summary. It may not cover all possible information. If you have questions about this medicine, talk to your doctor, pharmacist, or health care provider.  2020 Elsevier/Gold Standard (2013-09-05 14:19:39)  

## 2019-12-07 DIAGNOSIS — C50911 Malignant neoplasm of unspecified site of right female breast: Secondary | ICD-10-CM | POA: Diagnosis not present

## 2019-12-07 DIAGNOSIS — E785 Hyperlipidemia, unspecified: Secondary | ICD-10-CM | POA: Diagnosis not present

## 2019-12-07 DIAGNOSIS — G4700 Insomnia, unspecified: Secondary | ICD-10-CM | POA: Diagnosis not present

## 2019-12-07 DIAGNOSIS — Z79899 Other long term (current) drug therapy: Secondary | ICD-10-CM | POA: Diagnosis not present

## 2019-12-07 DIAGNOSIS — R7301 Impaired fasting glucose: Secondary | ICD-10-CM | POA: Diagnosis not present

## 2019-12-07 DIAGNOSIS — E875 Hyperkalemia: Secondary | ICD-10-CM | POA: Diagnosis not present

## 2019-12-07 DIAGNOSIS — M810 Age-related osteoporosis without current pathological fracture: Secondary | ICD-10-CM | POA: Diagnosis not present

## 2019-12-08 ENCOUNTER — Other Ambulatory Visit: Payer: Self-pay | Admitting: *Deleted

## 2019-12-08 DIAGNOSIS — M810 Age-related osteoporosis without current pathological fracture: Secondary | ICD-10-CM

## 2019-12-08 DIAGNOSIS — C50211 Malignant neoplasm of upper-inner quadrant of right female breast: Secondary | ICD-10-CM

## 2019-12-09 NOTE — Progress Notes (Signed)
Patient Care Team: Asencion Noble, MD as PCP - General (Internal Medicine)  DIAGNOSIS:    ICD-10-CM   1. Malignant neoplasm of upper-inner quadrant of right breast in female, estrogen receptor positive ([place])  C50.211    Z17.0     SUMMARY OF ONCOLOGIC HISTORY: Oncology History  Breast cancer of upper-inner quadrant of right female breast ([place])  12/11/2013 Breast MRI   Right breast upper inner quadrant 2.1 x 1.6 x 1.6 cm oval mass no abnormal lymph nodes   12/14/2013 Initial Diagnosis   Breast cancer of upper-inner quadrant of right female breast   12/23/2013 Surgery   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, 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, lymphovascular invasion is found, for sentinel lymph nodes negative ER 100% PR 100% HER-2 negative ratio 1.1, Ki-67 14%; Oncotype DX score of 15 (10% ROR)   02/15/2014 - 04/02/2014 Radiation Therapy   Adjuvant radiation therapy   04/06/2014 -  Anti-estrogen oral therapy   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 with above-mentioned history of right breast cancer treated with lumpectomy, radiation,and whois currently on anastrozole therapy. Mammogram on 01/20/19 showed no evidence of malignancy bilaterally. She presents to the clinic today for follow-up.   She is tolerating anastrozole extremely well without any major problems or concerns.  Denies any lumps or nodules in the breast.  ALLERGIES:  is allergic to latex.  MEDICATIONS:  Current Outpatient Medications  Medication Sig Dispense Refill  . ALPRAZolam (XANAX) 1 MG tablet Take 1 tablet by mouth as needed.    Marland Kitchen anastrozole (ARIMIDEX) 1 MG tablet TAKE 1 TABLET BY MOUTH  DAILY 90 tablet 0  . fish oil-omega-3 fatty acids 1000 MG capsule Take 2 g by mouth daily.     . Multiple Vitamin (MULTIVITAMIN) capsule  Take 1 capsule by mouth daily.      Marland Kitchen tretinoin (RETIN-A) 0.1 % cream     . venlafaxine XR (EFFEXOR-XR) 37.5 MG 24 hr capsule TAKE 1 CAPSULE BY MOUTH  DAILY WITH BREAKFAST 90 capsule 3   No current facility-administered medications for this visit.    PHYSICAL EXAMINATION: ECOG PERFORMANCE STATUS: 1 - Symptomatic but completely ambulatory  There were no vitals filed for this visit. There were no vitals filed for this visit.  BREAST: No palpable masses or nodules in either right or left breasts. No palpable axillary supraclavicular or infraclavicular adenopathy no breast tenderness or nipple discharge. (exam performed in the presence of a chaperone)  LABORATORY DATA:  I have reviewed the data as listed CMP Latest Ref Rng & Units 06/17/2019 12/09/2018 06/10/2018  Glucose 70 - 99 mg/dL 87 103(H) 70  BUN 8 - 23 mg/dL '22 20 16  ' Creatinine 0.44 - 1.00 mg/dL 0.74 0.77 0.73  Sodium 135 - 145 mmol/L 143 141 141  Potassium 3.5 - 5.1 mmol/L 4.4 4.2 4.4  Chloride 98 - 111 mmol/L 107 108 107  CO2 22 - 32 mmol/L '25 23 27  ' Calcium 8.9 - 10.3 mg/dL 9.5 9.1 9.2  Total Protein 6.5 - 8.1 g/dL - 6.9 7.1  Total Bilirubin 0.3 - 1.2 mg/dL - 0.2(L) 0.4  Alkaline Phos 38 - 126 U/L - 49 58  AST 15 - 41 U/L - 15 18  ALT 0 - 44 U/L - 18 23    Lab Results  Component Value Date   WBC 7.8 12/09/2018   HGB 12.6 12/09/2018   HCT 36.9 12/09/2018   MCV 101.9 (H) 12/09/2018   PLT 222 12/09/2018   NEUTROABS 4.8 12/09/2018    ASSESSMENT & PLAN:  Breast cancer of upper-inner quadrant of right female breast 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 Plan to treat till Dec 2022  Anastrozole  toxicities: Does not have any further neuropathy/myalgias related to prior tamoxifen therapy Occasional hot flashes well controlled with Effexor  Osteoporosis: Patient had T score of -2.5 and -2.6. On Prolia every 6 months, Bone density10/06/2017: T score -2.7 We discussed the fact that since she has been on Prolia for 6 years we should give her a break from Prolia for at least a couple of years to allow for bone remodeling. Therefore we discontinued Prolia today. She will be set up for a bone density in October. She will continue to take calcium supplementation and exercise regularly.  Surveillance: 1.Breast exam8/19/2021: No palpable lumps or nodules of concern 2.mammogram:01/20/2019: Benign breast density category C  Return to clinic in1 yearfor follow-up    No orders of the defined types were placed in this encounter.  The patient has a good understanding of the overall plan. she agrees with it. she will call with any problems that may develop before the next visit here.  Total time spent: 20 mins including face to face time and time spent for planning, charting and coordination of care  Nicholas Lose, MD 12/10/2019  I, Cloyde Reams Dorshimer, am acting as scribe for Dr. Nicholas Lose.  I have reviewed the above documentation for accuracy and completeness, and I agree with the above.

## 2019-12-10 ENCOUNTER — Inpatient Hospital Stay (HOSPITAL_BASED_OUTPATIENT_CLINIC_OR_DEPARTMENT_OTHER): Payer: Medicare PPO | Admitting: Hematology and Oncology

## 2019-12-10 ENCOUNTER — Inpatient Hospital Stay: Payer: Medicare PPO

## 2019-12-10 ENCOUNTER — Other Ambulatory Visit: Payer: Self-pay

## 2019-12-10 ENCOUNTER — Inpatient Hospital Stay: Payer: Medicare PPO | Attending: Hematology and Oncology

## 2019-12-10 DIAGNOSIS — C50211 Malignant neoplasm of upper-inner quadrant of right female breast: Secondary | ICD-10-CM | POA: Diagnosis not present

## 2019-12-10 DIAGNOSIS — Z170 Estrogen receptor positive status [ER+]: Secondary | ICD-10-CM

## 2019-12-10 LAB — CMP (CANCER CENTER ONLY)
ALT: 19 U/L (ref 0–44)
AST: 18 U/L (ref 15–41)
Albumin: 3.8 g/dL (ref 3.5–5.0)
Alkaline Phosphatase: 67 U/L (ref 38–126)
Anion gap: 6 (ref 5–15)
BUN: 17 mg/dL (ref 8–23)
CO2: 28 mmol/L (ref 22–32)
Calcium: 9.8 mg/dL (ref 8.9–10.3)
Chloride: 105 mmol/L (ref 98–111)
Creatinine: 0.78 mg/dL (ref 0.44–1.00)
GFR, Est AFR Am: >60 mL/min (ref >60)
GFR, Estimated: >60 mL/min (ref >60)
Glucose, Bld: 71 mg/dL (ref 70–99)
Potassium: 4.2 mmol/L (ref 3.5–5.1)
Sodium: 139 mmol/L (ref 135–145)
Total Bilirubin: 0.4 mg/dL (ref 0.3–1.2)
Total Protein: 7.1 g/dL (ref 6.5–8.1)

## 2019-12-10 LAB — CBC WITH DIFFERENTIAL (CANCER CENTER ONLY)
Abs Immature Granulocytes: 0.02 10*3/uL (ref 0.00–0.07)
Basophils Absolute: 0 10*3/uL (ref 0.0–0.1)
Basophils Relative: 1 %
Eosinophils Absolute: 0.1 10*3/uL (ref 0.0–0.5)
Eosinophils Relative: 2 %
HCT: 39.4 % (ref 36.0–46.0)
Hemoglobin: 13.3 g/dL (ref 12.0–15.0)
Immature Granulocytes: 1 %
Lymphocytes Relative: 40 %
Lymphs Abs: 1.7 10*3/uL (ref 0.7–4.0)
MCH: 34.7 pg — ABNORMAL HIGH (ref 26.0–34.0)
MCHC: 33.8 g/dL (ref 30.0–36.0)
MCV: 102.9 fL — ABNORMAL HIGH (ref 80.0–100.0)
Monocytes Absolute: 0.3 10*3/uL (ref 0.1–1.0)
Monocytes Relative: 8 %
Neutro Abs: 2.1 10*3/uL (ref 1.7–7.7)
Neutrophils Relative %: 48 %
Platelet Count: 211 10*3/uL (ref 150–400)
RBC: 3.83 MIL/uL — ABNORMAL LOW (ref 3.87–5.11)
RDW: 11.6 % (ref 11.5–15.5)
WBC Count: 4.2 10*3/uL (ref 4.0–10.5)
nRBC: 0 % (ref 0.0–0.2)

## 2019-12-10 MED ORDER — ANASTROZOLE 1 MG PO TABS: 1.0000 mg | ORAL_TABLET | Freq: Every day | ORAL | 3 refills | Status: DC

## 2019-12-10 MED ORDER — VENLAFAXINE HCL ER 37.5 MG PO CP24: ORAL_CAPSULE | ORAL | 3 refills | Status: DC

## 2019-12-10 NOTE — Assessment & Plan Note (Signed)
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 Plan to treat till Dec 2022  Anastrozole toxicities: Does not have any further neuropathy/myalgias related to prior tamoxifen therapy Occasional hot flashes well controlled with Effexor  Osteoporosis: Patient had T score of -2.5 and -2.6. On Prolia every 6 months, Bone density10/06/2017: T score -2.7  Surveillance: 1.Breast exam8/19/2021: No palpable lumps or nodules of concern 2.mammogram:01/20/2019: Benign breast density category C  Return to clinic in1 yearfor follow-up to coordinate with her prolia injection.  She will need Prolia every 6 months.

## 2019-12-14 ENCOUNTER — Other Ambulatory Visit: Payer: Self-pay | Admitting: Hematology and Oncology

## 2019-12-14 DIAGNOSIS — G4700 Insomnia, unspecified: Secondary | ICD-10-CM | POA: Diagnosis not present

## 2019-12-14 DIAGNOSIS — Z9889 Other specified postprocedural states: Secondary | ICD-10-CM

## 2019-12-14 DIAGNOSIS — Z0001 Encounter for general adult medical examination with abnormal findings: Secondary | ICD-10-CM | POA: Diagnosis not present

## 2019-12-14 DIAGNOSIS — Z130 Encounter for screening for diseases of the blood and blood-forming organs and certain disorders involving the immune mechanism: Secondary | ICD-10-CM | POA: Diagnosis not present

## 2019-12-14 DIAGNOSIS — M810 Age-related osteoporosis without current pathological fracture: Secondary | ICD-10-CM | POA: Diagnosis not present

## 2019-12-14 DIAGNOSIS — Z853 Personal history of malignant neoplasm of breast: Secondary | ICD-10-CM | POA: Diagnosis not present

## 2019-12-23 ENCOUNTER — Other Ambulatory Visit: Payer: Self-pay | Admitting: Hematology and Oncology

## 2019-12-23 DIAGNOSIS — Z170 Estrogen receptor positive status [ER+]: Secondary | ICD-10-CM

## 2019-12-24 ENCOUNTER — Other Ambulatory Visit: Payer: Self-pay | Admitting: *Deleted

## 2019-12-24 DIAGNOSIS — C50211 Malignant neoplasm of upper-inner quadrant of right female breast: Secondary | ICD-10-CM

## 2019-12-24 MED ORDER — ANASTROZOLE 1 MG PO TABS: 1.0000 mg | ORAL_TABLET | Freq: Every day | ORAL | 3 refills | Status: DC

## 2020-01-06 DIAGNOSIS — H43813 Vitreous degeneration, bilateral: Secondary | ICD-10-CM | POA: Diagnosis not present

## 2020-01-21 ENCOUNTER — Other Ambulatory Visit: Payer: Self-pay

## 2020-01-21 ENCOUNTER — Ambulatory Visit
Admission: RE | Admit: 2020-01-21 | Discharge: 2020-01-21 | Disposition: A | Discharge status: Home / Self Care | Payer: Medicare PPO | Source: Ambulatory Visit | Attending: Hematology and Oncology | Admitting: Hematology and Oncology

## 2020-01-21 DIAGNOSIS — Z853 Personal history of malignant neoplasm of breast: Secondary | ICD-10-CM

## 2020-01-21 DIAGNOSIS — R922 Inconclusive mammogram: Secondary | ICD-10-CM | POA: Diagnosis not present

## 2020-01-21 DIAGNOSIS — Z9889 Other specified postprocedural states: Secondary | ICD-10-CM

## 2020-02-11 DIAGNOSIS — L738 Other specified follicular disorders: Secondary | ICD-10-CM | POA: Diagnosis not present

## 2020-02-11 DIAGNOSIS — Z85828 Personal history of other malignant neoplasm of skin: Secondary | ICD-10-CM | POA: Diagnosis not present

## 2020-02-11 DIAGNOSIS — L739 Follicular disorder, unspecified: Secondary | ICD-10-CM | POA: Diagnosis not present

## 2020-02-11 DIAGNOSIS — L821 Other seborrheic keratosis: Secondary | ICD-10-CM | POA: Diagnosis not present

## 2020-02-11 DIAGNOSIS — D0471 Carcinoma in situ of skin of right lower limb, including hip: Secondary | ICD-10-CM | POA: Diagnosis not present

## 2020-02-11 DIAGNOSIS — D2262 Melanocytic nevi of left upper limb, including shoulder: Secondary | ICD-10-CM | POA: Diagnosis not present

## 2020-02-11 DIAGNOSIS — L570 Actinic keratosis: Secondary | ICD-10-CM | POA: Diagnosis not present

## 2020-02-11 DIAGNOSIS — D485 Neoplasm of uncertain behavior of skin: Secondary | ICD-10-CM | POA: Diagnosis not present

## 2020-02-12 DIAGNOSIS — Z23 Encounter for immunization: Secondary | ICD-10-CM | POA: Diagnosis not present

## 2020-03-07 ENCOUNTER — Other Ambulatory Visit: Payer: Self-pay | Admitting: *Deleted

## 2020-03-07 MED ORDER — VENLAFAXINE HCL ER 37.5 MG PO CP24: ORAL_CAPSULE | ORAL | 3 refills | Status: DC

## 2020-03-28 ENCOUNTER — Ambulatory Visit
Admission: RE | Admit: 2020-03-28 | Discharge: 2020-03-28 | Disposition: A | Discharge status: Home / Self Care | Payer: Medicare PPO | Source: Ambulatory Visit | Attending: Hematology and Oncology | Admitting: Hematology and Oncology

## 2020-03-28 ENCOUNTER — Other Ambulatory Visit: Payer: Self-pay

## 2020-03-28 DIAGNOSIS — M810 Age-related osteoporosis without current pathological fracture: Secondary | ICD-10-CM | POA: Diagnosis not present

## 2020-03-28 DIAGNOSIS — M85852 Other specified disorders of bone density and structure, left thigh: Secondary | ICD-10-CM | POA: Diagnosis not present

## 2020-03-28 DIAGNOSIS — Z170 Estrogen receptor positive status [ER+]: Secondary | ICD-10-CM

## 2020-03-28 DIAGNOSIS — Z780 Asymptomatic menopausal state: Secondary | ICD-10-CM | POA: Diagnosis not present

## 2020-04-13 ENCOUNTER — Telehealth: Payer: Self-pay | Admitting: *Deleted

## 2020-04-13 NOTE — Telephone Encounter (Signed)
Received call from pt stating she has stopped anastrozole due to bone density score being -2.9.  Pt requesting f/u with MD to discuss further options.  Apt scheduled and pt verbalized understanding of apt date and time.

## 2020-04-19 ENCOUNTER — Other Ambulatory Visit: Payer: Self-pay

## 2020-04-19 ENCOUNTER — Ambulatory Visit: Payer: Medicare PPO | Admitting: Obstetrics and Gynecology

## 2020-04-19 ENCOUNTER — Encounter: Payer: Self-pay | Admitting: Obstetrics and Gynecology

## 2020-04-19 VITALS — BP 142/70 | HR 72 | Ht 63.0 in | Wt 132.8 lb

## 2020-04-19 DIAGNOSIS — M810 Age-related osteoporosis without current pathological fracture: Secondary | ICD-10-CM

## 2020-04-19 DIAGNOSIS — Z1239 Encounter for other screening for malignant neoplasm of breast: Secondary | ICD-10-CM | POA: Diagnosis not present

## 2020-04-19 DIAGNOSIS — N951 Menopausal and female climacteric states: Secondary | ICD-10-CM

## 2020-04-19 DIAGNOSIS — E7800 Pure hypercholesterolemia, unspecified: Secondary | ICD-10-CM

## 2020-04-19 DIAGNOSIS — Z01419 Encounter for gynecological examination (general) (routine) without abnormal findings: Secondary | ICD-10-CM

## 2020-04-19 NOTE — Progress Notes (Signed)
74 y.o. G2P2 Married Caucasian female here for pelvic, pap and breast exam.    Denies vaginal bleeding or discharge.  She wants to discuss her bone density, 03/28/20. Spine T score -2.9 (was -2.6), left hip T score -2.5 (was -2.7).  She stopped her Anastrozole.  She had been on this for 6 years.  Her oncologist is not aware of this.  She will see him next week.  She is more concerned about her bone health.   Patient started Prolia when she started taking Anastrozole. Her insurance stopped paying for the Prolia, but this was very effective for her osteoporosis.  She received Zoledronic Acid in February, 2021. Took Fosamax years ago.  She does not remember why she stopped.   She taking Effexor XR for her hot flashes.  She feels better after stopping the Anastrozole.  Less hot flashes.  Stopped Xanax in August.   She is sleeping better now.   Received her Covid vaccine booster and her flu vaccine.   PCP:   [name], MD  Patient's last menstrual period was 04/23/1993.           Sexually active: Yes.    The current method of family planning is tubal ligation.    Exercising: Yes.    exercise classes, line dancing, silver sneakers Smoker:  no  Health Maintenance: Pap: 04-08-19 Neg, 03-06-17 Neg, 12-15-14 Neg History of abnormal Pap:  no MMG: 01-21-20 Diag.Bil./stable posttreatment changes right breast/Neg/density C/BiRads2 Colonoscopy: 2013 normal;next 2023 BMD: 03-28-20  Result :Osteoporosis of hip and spine TDaP:  PCP Gardasil:   no HIV:no Hep C:no Screening Labs:  PCP.   reports that she quit smoking about 46 years ago. She has a 2.50 pack-year smoking history. She has never used smokeless tobacco. She reports that she does not drink alcohol and does not use drugs.  Past Medical History:  Diagnosis Date  . Breast cancer, right breast [facility]) August 2015   lumpectomy and radiation therapy  . Fibroids 12/2008   history of  . Osteoporosis   . Personal history of radiation  therapy   . Skin cancer    basal cell    Past Surgical History:  Procedure Laterality Date  . APPENDECTOMY    . BREAST EXCISIONAL BIOPSY Right   . BREAST LUMPECTOMY Right 2015  . BREAST SURGERY  2015   right lumpectomy for breast cancer  . COLONOSCOPY  10 years ago   dr Karilyn Cota  . COLONOSCOPY  12/07/2010   Procedure: COLONOSCOPY;  Surgeon: Malissa Hippo, MD;  Location: [facility];  Service: Endoscopy;  Laterality: N/A;  . TOE ARTHROSCOPY  2005   rt great toe spurs  . TONSILLECTOMY    . TUBAL LIGATION  1976  . VAGINAL DELIVERY     times 2    Current Outpatient Medications  Medication Sig Dispense Refill  . CALCIUM PO Take by mouth. Takes a total of 1100mg  daily    . fish oil-omega-3 fatty acids 1000 MG capsule Take 2 g by mouth daily.    . Multiple Vitamin (MULTIVITAMIN) capsule Take 1 capsule by mouth daily.    . NON FORMULARY     . venlafaxine XR (EFFEXOR-XR) 37.5 MG 24 hr capsule Once daily 90 capsule 3   No current facility-administered medications for this visit.    Family History  Problem Relation Age of Onset  . Diabetes Brother   . Heart failure Mother   . Aneurysm Mother  abdominal  . Cancer Maternal Grandfather     Review of Systems  All other systems reviewed and are negative.   Exam:   BP (!) 142/70   Pulse 72   Ht 5\' 3"  (1.6 m)   Wt 132 lb 12.8 oz (60.2 kg)   LMP 04/23/1993   SpO2 100%   BMI 23.52 kg/m     General appearance: alert, cooperative and appears stated age Head: normocephalic, without obvious abnormality, atraumatic Neck: no adenopathy, supple, symmetrical, trachea midline and thyroid normal to inspection and palpation Lungs: clear to auscultation bilaterally Breasts: left - normal appearance, no masses or tenderness, No nipple retraction or dimpling, No nipple discharge or bleeding, No axillary adenopathy Right - periareolar scar, no masses or tenderness, No nipple retraction or dimpling, No nipple discharge or bleeding,  No axillary adenopathy Heart: regular rate and rhythm Abdomen: soft, non-tender; no masses, no organomegaly Extremities: extremities normal, atraumatic, no cyanosis or edema Skin: skin color, texture, turgor normal. No rashes or lesions Lymph nodes: cervical, supraclavicular, and axillary nodes normal. Neurologic: grossly normal  Pelvic: External genitalia:  no lesions              No abnormal inguinal nodes palpated.              Urethra:  normal appearing urethra with no masses, tenderness or lesions              Bartholins and Skenes: normal                 Vagina: normal appearing vagina with normal color and discharge, no lesions              Cervix: no lesions              Pap taken: No. Bimanual Exam:  Uterus:  normal size, contour, position, consistency, mobility, non-tender              Adnexa: no mass, fullness, tenderness              Rectal exam: Yes.  .  Confirms.              Anus:  normal sphincter tone, no lesions  Chaperone was present for exam.  Assessment:   Normal screening breast and pelvic exam.  Right breast cancer. Off Anastrozole.   Osteoporosis. On Zometa.  Menopausal symptoms treated with Effexor.Ok to continue.  Feels better off of Xanax.  Elevated LDL cholesterol.   Plan: Mammogram screening discussed. Self breast awareness reviewed. Pap in 2022.  Guidelines for Calcium, Vitamin D, regular exercise program including cardiovascular and weight bearing exercise. We discussed osteoporosis in detail.  Screening at least every 2 years, calcium and vit D requirements, fall prevention, and treatment options - Reclast, Prolia, and oral bisphosphonates.  She will see Dr. 2023 in near future and discuss a plan regarding stopping Anastrozole and potential infusion of Zometa.  We reviewed a low cholesterol/low fat diet.  Follow up annually and prn.   30 min  total time was spent for this patient encounter, including preparation, face-to-face counseling  with the patient, coordination of care, and documentation of the encounter.

## 2020-04-19 NOTE — Patient Instructions (Signed)
Osteoporosis  Osteoporosis is thinning and loss of density in your bones. Osteoporosis makes bones more brittle and fragile and more likely to break (fracture). Over time, osteoporosis can cause your bones to become so weak that they fracture after a minor fall. Bones in the hip, wrist, and spine are most likely to fracture due to osteoporosis. What are the causes? The exact cause of this condition is not known. What increases the risk? You may be at greater risk for osteoporosis if you:  Have a family history of the condition.  Have poor nutrition.  Use steroid medicines, such as prednisone.  Are female.  Are age 76 or older.  Smoke or have a history of smoking.  Are not physically active (are sedentary).  Are white (Caucasian) or of Asian descent.  Have a small body frame.  Take certain medicines, such as antiseizure medicines. What are the signs or symptoms? A fracture might be the first sign of osteoporosis, especially if the fracture results from a fall or injury that usually would not cause a bone to break. Other signs and symptoms include:  Pain in the neck or low back.  Stooped posture.  Loss of height. How is this diagnosed? This condition may be diagnosed based on:  Your medical history.  A physical exam.  A bone mineral density test, also called a DXA or DEXA test (dual-energy X-ray absorptiometry test). This test uses X-rays to measure the amount of minerals in your bones. How is this treated? The goal of treatment is to strengthen your bones and lower your risk for a fracture. Treatment may involve:  Making lifestyle changes, such as: ? Including foods with more calcium and vitamin D in your diet. ? Doing weight-bearing and muscle-strengthening exercises. ? Stopping tobacco use. ? Limiting alcohol intake.  Taking medicine to slow the process of bone loss or to increase bone density.  Taking daily supplements of calcium and vitamin D.  Taking  hormone replacement medicines, such as estrogen for women and testosterone for men.  Monitoring your levels of calcium and vitamin D. Follow these instructions at home:  Activity  Exercise as told by your health care provider. Ask your health care provider what exercises and activities are safe for you. You should do: ? Exercises that make you work against gravity (weight-bearing exercises), such as tai chi, yoga, or walking. ? Exercises to strengthen muscles, such as lifting weights. Lifestyle  Limit alcohol intake to no more than 1 drink a day for nonpregnant women and 2 drinks a day for men. One drink equals 12 oz of beer, 5 oz of wine, or 1 oz of hard liquor.  Do not use any products that contain nicotine or tobacco, such as cigarettes and e-cigarettes. If you need help quitting, ask your health care provider. Preventing falls  Use devices to help you move around (mobility aids) as needed, such as canes, walkers, scooters, or crutches.  Keep rooms well-lit and clutter-free.  Remove tripping hazards from walkways, including cords and throw rugs.  Install grab bars in bathrooms and safety rails on stairs.  Use rubber mats in the bathroom and other areas that are often wet or slippery.  Wear closed-toe shoes that fit well and support your feet. Wear shoes that have rubber soles or low heels.  Review your medicines with your health care provider. Some medicines can cause dizziness or changes in blood pressure, which can increase your risk of falling. General instructions  Include calcium and vitamin D in  your diet. Calcium is important for bone health, and vitamin D helps your body to absorb calcium. Good sources of calcium and vitamin D include: ? Certain fatty fish, such as salmon and tuna. ? Products that have calcium and vitamin D added to them (fortified products), such as fortified cereals. ? Egg yolks. ? Cheese. ? Liver.  Take over-the-counter and prescription medicines  only as told by your health care provider.  Keep all follow-up visits as told by your health care provider. This is important. Contact a health care provider if:  You have never been screened for osteoporosis and you are: ? A woman who is age 55 or older. ? A man who is age 20 or older. Get help right away if:  You fall or injure yourself. Summary  Osteoporosis is thinning and loss of density in your bones. This makes bones more brittle and fragile and more likely to break (fracture),even with minor falls.  The goal of treatment is to strengthen your bones and reduce your risk for a fracture.  Include calcium and vitamin D in your diet. Calcium is important for bone health, and vitamin D helps your body to absorb calcium.  Talk with your health care provider about screening for osteoporosis if you are a woman who is age 48 or older, or a man who is age 15 or older. This information is not intended to replace advice given to you by your health care provider. Make sure you discuss any questions you have with your health care provider. Document Revised: 03/22/2017 Document Reviewed: 02/01/2017 Elsevier Patient Education  2020 [employer].

## 2020-04-25 ENCOUNTER — Ambulatory Visit: Payer: Medicare Other | Admitting: Obstetrics and Gynecology

## 2020-04-26 NOTE — Progress Notes (Signed)
HEMATOLOGY-ONCOLOGY TELEPHONE VISIT PROGRESS NOTE  I connected with Kaitlyn Porter on 04/27/2020 at 10:00 AM EST by telephone and verified that I am speaking with the correct person using two identifiers.  I discussed the limitations, risks, security and privacy concerns of performing an evaluation and management service by telephone and the availability of in person appointments.  I also discussed with the patient that there may be a patient responsible charge related to this service. The patient expressed understanding and agreed to proceed.   History of Present Illness: Kaitlyn Porter is a 75 y.o. female with above-mentioned history of right breast cancer treated with lumpectomy,radiation,andwhois currently on anastrozole therapy.Mammogram on 01/21/20 showed no evidence of malignancy bilaterally. Bone density scan on 03/28/20 showed osteoporosis with a T-score of -2.9 at the [facility]. She presents to the clinic todayfor follow-up.  Oncology History  Breast cancer of upper-inner quadrant of right female breast ([place])  12/11/2013 Breast MRI   Right breast upper inner quadrant 2.1 x 1.6 x 1.6 cm oval mass no abnormal lymph nodes   12/14/2013 Initial Diagnosis   Breast cancer of upper-inner quadrant of right female breast   12/23/2013 Surgery   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, 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, lymphovascular invasion is found, for sentinel lymph nodes negative ER 100% PR 100% HER-2 negative ratio 1.1, Ki-67 14%; Oncotype DX score of 15 (10% ROR)   02/15/2014 - 04/02/2014 Radiation Therapy   Adjuvant radiation therapy   04/06/2014 -  Anti-estrogen oral therapy   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 Plan to treat till Dec 2022  Anastrozole toxicities: Does not have any further neuropathy/myalgias related to prior tamoxifen therapy Occasional hot flashes well controlled with Effexor Quit taking it because of Osteoporosis risks We will try to obtain Prolia for her and then she can resume antiestrogen therapy until December 2020.Marland Kitchen We discussed risks and benefits of tamoxifen as an alternative but because she was slightly intolerant to this previously we decided not to initiate it.  Osteoporosis: Patient had T score of -2.5 and -2.6 Bone density10/06/2017: T score -2.28 January 2020. T score -2.9 Plan to check it in 1 year (medicare allows it for patients to have rapid worsening of bone density or changes made to their treatment plans)  Will resume Prolia.  I will request for prior authorization and to schedule her injection. She will continue to take calcium supplementation and exercise regularly.  Surveillance: 1.Breast exam8/19/2021: No palpable lumps or nodules of concern 2.mammogram:01/20/2019: Benign breast density category C  Return to clinic inAugust 2022 for follow-up along with Prolia injection.     I discussed the assessment and treatment plan with the patient. The patient was provided an opportunity to ask questions and all were answered. The patient agreed with the plan and demonstrated an understanding of the instructions. The patient was advised to call back or seek an in-person evaluation if the symptoms worsen or if the condition fails to improve as  anticipated.   I provided 20 minutes of non-face-to-face time and for charting and coordination of care during this encounter.   Rulon Eisenmenger, MD 04/27/2020    I, Molly Dorshimer, am acting as scribe for Nicholas Lose, MD.  I have reviewed the above documentation for accuracy and completeness, and I agree with the above.

## 2020-04-27 ENCOUNTER — Inpatient Hospital Stay: Payer: Medicare PPO | Attending: Hematology and Oncology | Admitting: Hematology and Oncology

## 2020-04-27 DIAGNOSIS — Z79899 Other long term (current) drug therapy: Secondary | ICD-10-CM | POA: Insufficient documentation

## 2020-04-27 DIAGNOSIS — Z923 Personal history of irradiation: Secondary | ICD-10-CM | POA: Insufficient documentation

## 2020-04-27 DIAGNOSIS — C50211 Malignant neoplasm of upper-inner quadrant of right female breast: Secondary | ICD-10-CM | POA: Diagnosis not present

## 2020-04-27 DIAGNOSIS — Z170 Estrogen receptor positive status [ER+]: Secondary | ICD-10-CM | POA: Insufficient documentation

## 2020-04-27 DIAGNOSIS — M810 Age-related osteoporosis without current pathological fracture: Secondary | ICD-10-CM | POA: Insufficient documentation

## 2020-04-27 DIAGNOSIS — Z79811 Long term (current) use of aromatase inhibitors: Secondary | ICD-10-CM | POA: Insufficient documentation

## 2020-04-27 NOTE — Assessment & Plan Note (Signed)
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 Plan to treat till Dec 2022  Anastrozole toxicities: Does not have any further neuropathy/myalgias related to prior tamoxifen therapy Occasional hot flashes well controlled with Effexor Quit taking it because of Osteoporosis risks Recommend switching her to Tamoxifen  Tamoxifen Counseling: We discussed the risks and benefits of tamoxifen. These include but not limited to insomnia, hot flashes, mood changes, vaginal dryness, and weight gain. Although rare, serious side effects including endometrial cancer, risk of blood clots were also discussed. We strongly believe that the benefits far outweigh the risks. Patient understands these risks and consented to starting treatment.     Osteoporosis: Patient had T score of -2.5 and -2.6 Bone density10/06/2017: T score -2.28 January 2020. T score -2.9  Will resume Prolia She will continue to take calcium supplementation and exercise regularly.  Surveillance: 1.Breast exam8/19/2021: No palpable lumps or nodules of concern 2.mammogram:01/20/2019: Benign breast density category C  Return to clinic in1 yearfor follow-up  

## 2020-04-29 ENCOUNTER — Telehealth: Payer: Self-pay | Admitting: Hematology and Oncology

## 2020-04-29 NOTE — Telephone Encounter (Signed)
Scheduled per 1/5 los. Called and spoke with pt, confirmed 1/19 appts

## 2020-05-10 ENCOUNTER — Other Ambulatory Visit: Payer: Self-pay | Admitting: *Deleted

## 2020-05-10 DIAGNOSIS — Z170 Estrogen receptor positive status [ER+]: Secondary | ICD-10-CM

## 2020-05-11 ENCOUNTER — Inpatient Hospital Stay: Payer: Medicare PPO

## 2020-05-11 ENCOUNTER — Other Ambulatory Visit: Payer: Self-pay

## 2020-05-11 VITALS — BP 152/82 | HR 71 | Temp 98.4°F | Resp 16

## 2020-05-11 DIAGNOSIS — Z79899 Other long term (current) drug therapy: Secondary | ICD-10-CM | POA: Diagnosis not present

## 2020-05-11 DIAGNOSIS — C50211 Malignant neoplasm of upper-inner quadrant of right female breast: Secondary | ICD-10-CM | POA: Diagnosis not present

## 2020-05-11 DIAGNOSIS — Z79811 Long term (current) use of aromatase inhibitors: Secondary | ICD-10-CM | POA: Diagnosis not present

## 2020-05-11 DIAGNOSIS — Z170 Estrogen receptor positive status [ER+]: Secondary | ICD-10-CM

## 2020-05-11 DIAGNOSIS — Z923 Personal history of irradiation: Secondary | ICD-10-CM | POA: Diagnosis not present

## 2020-05-11 DIAGNOSIS — M810 Age-related osteoporosis without current pathological fracture: Secondary | ICD-10-CM

## 2020-05-11 LAB — CMP (CANCER CENTER ONLY)
ALT: 29 U/L (ref 0–44)
AST: 22 U/L (ref 15–41)
Albumin: 3.8 g/dL (ref 3.5–5.0)
Alkaline Phosphatase: 64 U/L (ref 38–126)
Anion gap: 6 (ref 5–15)
BUN: 20 mg/dL (ref 8–23)
CO2: 29 mmol/L (ref 22–32)
Calcium: 9.1 mg/dL (ref 8.9–10.3)
Chloride: 106 mmol/L (ref 98–111)
Creatinine: 0.81 mg/dL (ref 0.44–1.00)
GFR, Estimated: >60 mL/min (ref >60)
Glucose, Bld: 92 mg/dL (ref 70–99)
Potassium: 4.7 mmol/L (ref 3.5–5.1)
Sodium: 141 mmol/L (ref 135–145)
Total Bilirubin: 0.3 mg/dL (ref 0.3–1.2)
Total Protein: 7.1 g/dL (ref 6.5–8.1)

## 2020-05-11 LAB — CBC WITH DIFFERENTIAL (CANCER CENTER ONLY)
Abs Immature Granulocytes: 0.01 10*3/uL (ref 0.00–0.07)
Basophils Absolute: 0 10*3/uL (ref 0.0–0.1)
Basophils Relative: 1 %
Eosinophils Absolute: 0.1 10*3/uL (ref 0.0–0.5)
Eosinophils Relative: 1 %
HCT: 40.2 % (ref 36.0–46.0)
Hemoglobin: 13.5 g/dL (ref 12.0–15.0)
Immature Granulocytes: 0 %
Lymphocytes Relative: 33 %
Lymphs Abs: 1.9 10*3/uL (ref 0.7–4.0)
MCH: 34.6 pg — ABNORMAL HIGH (ref 26.0–34.0)
MCHC: 33.6 g/dL (ref 30.0–36.0)
MCV: 103.1 fL — ABNORMAL HIGH (ref 80.0–100.0)
Monocytes Absolute: 0.4 10*3/uL (ref 0.1–1.0)
Monocytes Relative: 7 %
Neutro Abs: 3.4 10*3/uL (ref 1.7–7.7)
Neutrophils Relative %: 58 %
Platelet Count: 238 10*3/uL (ref 150–400)
RBC: 3.9 MIL/uL (ref 3.87–5.11)
RDW: 11.9 % (ref 11.5–15.5)
WBC Count: 5.8 10*3/uL (ref 4.0–10.5)
nRBC: 0 % (ref 0.0–0.2)

## 2020-05-11 MED ORDER — DENOSUMAB 60 MG/ML ~~LOC~~ SOSY
60.0000 mg | PREFILLED_SYRINGE | Freq: Once | SUBCUTANEOUS | Status: AC
  Administered 2020-05-11: 60 mg via SUBCUTANEOUS

## 2020-05-11 MED ORDER — DENOSUMAB 60 MG/ML ~~LOC~~ SOSY
PREFILLED_SYRINGE | SUBCUTANEOUS | Status: AC
  Filled 2020-05-11: qty 1

## 2020-08-16 ENCOUNTER — Ambulatory Visit: Payer: Medicare Other | Admitting: Obstetrics and Gynecology

## 2020-10-05 ENCOUNTER — Telehealth: Payer: Self-pay

## 2020-10-05 NOTE — Telephone Encounter (Signed)
Dr.Silva patient called back to follow up from the below. Needs a order faxed to [facility] for "Lumpectomy product". I will have Dr.Silva sign and fax order to [phone] provided by patient.

## 2020-10-05 NOTE — Telephone Encounter (Signed)
Patient called stating she read online that Medicare will pay $30 on a bra for breast cancer survivors with lumpectomy/mastectomy and she would like a prescription for a bra. She has had lumpectomy.  She will be getting the bra from a specialty store on [address] that provides sizing and insert. I told her I thought that they have an order sheet that they can fax over to [place] for the patient and the MD will sign it and fax back.  She will call them and inquire and call me back if needs additional assistance.

## 2020-10-31 DIAGNOSIS — C50911 Malignant neoplasm of unspecified site of right female breast: Secondary | ICD-10-CM | POA: Diagnosis not present

## 2020-11-15 ENCOUNTER — Encounter (INDEPENDENT_AMBULATORY_CARE_PROVIDER_SITE_OTHER): Payer: Self-pay | Admitting: *Deleted

## 2020-11-15 DIAGNOSIS — C50911 Malignant neoplasm of unspecified site of right female breast: Secondary | ICD-10-CM | POA: Diagnosis not present

## 2020-11-17 DIAGNOSIS — C50911 Malignant neoplasm of unspecified site of right female breast: Secondary | ICD-10-CM | POA: Diagnosis not present

## 2020-12-08 ENCOUNTER — Other Ambulatory Visit: Payer: Self-pay

## 2020-12-08 DIAGNOSIS — M810 Age-related osteoporosis without current pathological fracture: Secondary | ICD-10-CM | POA: Diagnosis not present

## 2020-12-08 DIAGNOSIS — Z170 Estrogen receptor positive status [ER+]: Secondary | ICD-10-CM

## 2020-12-08 DIAGNOSIS — R7301 Impaired fasting glucose: Secondary | ICD-10-CM | POA: Diagnosis not present

## 2020-12-08 DIAGNOSIS — C50919 Malignant neoplasm of unspecified site of unspecified female breast: Secondary | ICD-10-CM | POA: Diagnosis not present

## 2020-12-08 DIAGNOSIS — E785 Hyperlipidemia, unspecified: Secondary | ICD-10-CM | POA: Diagnosis not present

## 2020-12-08 DIAGNOSIS — Z79899 Other long term (current) drug therapy: Secondary | ICD-10-CM | POA: Diagnosis not present

## 2020-12-08 DIAGNOSIS — G4700 Insomnia, unspecified: Secondary | ICD-10-CM | POA: Diagnosis not present

## 2020-12-08 NOTE — Progress Notes (Signed)
Patient Care Team: Asencion Noble, MD as PCP - General (Internal Medicine)  DIAGNOSIS:    ICD-10-CM   1. Malignant neoplasm of upper-inner quadrant of right breast in female, estrogen receptor positive ([place])  C50.211    Z17.0       SUMMARY OF ONCOLOGIC HISTORY: Oncology History  Breast cancer of upper-inner quadrant of right female breast ([place])  12/11/2013 Breast MRI   Right breast upper inner quadrant 2.1 x 1.6 x 1.6 cm oval mass no abnormal lymph nodes   12/14/2013 Initial Diagnosis   Breast cancer of upper-inner quadrant of right female breast   12/23/2013 Surgery   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, 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, lymphovascular invasion is found, for sentinel lymph nodes negative ER 100% PR 100% HER-2 negative ratio 1.1, Ki-67 14%; Oncotype DX score of 15 (10% ROR)   02/15/2014 - 04/02/2014 Radiation Therapy   Adjuvant radiation therapy   04/06/2014 -  Anti-estrogen oral therapy   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 with above-mentioned history of right breast cancer treated with lumpectomy, radiation, and who is currently on anastrozole therapy. She presents to the clinic today for follow-up.  She has completed 7 years of antiestrogen therapy and will discontinue it at this time.  Did not have any negative side effects from hormone therapy.  Denies any lumps or nodules in the breast.  ALLERGIES:  is allergic to latex.  MEDICATIONS:  Current Outpatient Medications  Medication Sig Dispense Refill   CALCIUM PO Take by mouth. Takes a total of 114m daily     fish oil-omega-3 fatty acids 1000 MG capsule Take 2 g by mouth daily.     Multiple Vitamin (MULTIVITAMIN) capsule Take 1 capsule by mouth daily.     NON FORMULARY      venlafaxine XR (EFFEXOR-XR) 37.5 MG 24 hr capsule Once daily 90  capsule 3   No current facility-administered medications for this visit.    PHYSICAL EXAMINATION: ECOG PERFORMANCE STATUS: 1 - Symptomatic but completely ambulatory  Vitals:   12/09/20 0956  BP: (!) 151/74  Pulse: 87  Resp: 18  Temp: 97.6 F (36.4 C)  SpO2: 100%   Filed Weights   12/09/20 0956  Weight: 137 lb 4.8 oz (62.3 kg)    BREAST: No palpable masses or nodules in either right or left breasts. No palpable axillary supraclavicular or infraclavicular adenopathy no breast tenderness or nipple discharge. (exam performed in the presence of a chaperone)  LABORATORY DATA:  I have reviewed the data as listed CMP Latest Ref Rng & Units 05/11/2020 12/10/2019 06/17/2019  Glucose 70 - 99 mg/dL 92 71 87  BUN 8 - 23 mg/dL '20 17 22  ' Creatinine 0.44 - 1.00 mg/dL 0.81 0.78 0.74  Sodium 135 - 145 mmol/L 141 139 143  Potassium 3.5 - 5.1 mmol/L 4.7 4.2 4.4  Chloride 98 - 111 mmol/L 106 105 107  CO2 22 - 32 mmol/L '29 28 25  ' Calcium 8.9 - 10.3 mg/dL 9.1 9.8 9.5  Total Protein 6.5 - 8.1 g/dL 7.1 7.1 -  Total Bilirubin 0.3 - 1.2 mg/dL 0.3 0.4 -  Alkaline Phos 38 - 126 U/L 64 67 -  AST 15 - 41 U/L 22 18 -  ALT 0 - 44 U/L 29 19 -    Lab Results  Component Value Date   WBC 6.3 12/09/2020   HGB 13.2 12/09/2020   HCT 38.7 12/09/2020   MCV 102.1 (H) 12/09/2020   PLT 235 12/09/2020   NEUTROABS 3.7 12/09/2020    ASSESSMENT & PLAN:  Breast cancer of upper-inner quadrant of right female breast 100% HER-2 negative Ki-67 14% grade 1, Oncotype DX recurrence score 13 (ROR 10%)   Treatment summary: Tamoxifen 20 mg daily started 01/22/2014 changed to anastrozole October 2016 due to progressively worsening neuropathy  Completed 7 years of antiestrogen therapy and therefore she will discontinue hormone therapy at this time.    Occasional hot flashes well controlled with Effexor   Osteoporosis:  Bone density 01/23/2018: T score -2.28 January 2020. T score -2.9 Resumed Prolia with calcium and  vitamin D To be rechecked in December 2023    Surveillance: 1.  Breast exam 12/10/2019: No palpable lumps or nodules of concern 2. mammogram: 01/21/2020: Benign breast density category C  Return to clinic in every 6 months for Prolia and once a year for follow-up with [place]    No orders of the defined types were placed in this encounter.  The patient has a good understanding of the overall plan. she agrees with it. she will call with any problems that may develop before the next visit here.  Total time spent: 20 mins including face to face time and time spent for planning, charting and coordination of care  Kaitlyn Eisenmenger, MD, MPH 12/09/2020  I, Kaitlyn Porter, am acting as scribe for Dr. Nicholas Lose.  I have reviewed the above documentation for accuracy and completeness, and I agree with the above.

## 2020-12-09 ENCOUNTER — Inpatient Hospital Stay: Payer: Medicare PPO

## 2020-12-09 ENCOUNTER — Inpatient Hospital Stay: Payer: Medicare PPO | Attending: Hematology and Oncology | Admitting: Hematology and Oncology

## 2020-12-09 ENCOUNTER — Other Ambulatory Visit: Payer: Self-pay

## 2020-12-09 DIAGNOSIS — Z170 Estrogen receptor positive status [ER+]: Secondary | ICD-10-CM | POA: Diagnosis not present

## 2020-12-09 DIAGNOSIS — C50211 Malignant neoplasm of upper-inner quadrant of right female breast: Secondary | ICD-10-CM

## 2020-12-09 DIAGNOSIS — M810 Age-related osteoporosis without current pathological fracture: Secondary | ICD-10-CM | POA: Insufficient documentation

## 2020-12-09 DIAGNOSIS — R232 Flushing: Secondary | ICD-10-CM | POA: Insufficient documentation

## 2020-12-09 DIAGNOSIS — Z853 Personal history of malignant neoplasm of breast: Secondary | ICD-10-CM | POA: Insufficient documentation

## 2020-12-09 DIAGNOSIS — Z79899 Other long term (current) drug therapy: Secondary | ICD-10-CM | POA: Diagnosis not present

## 2020-12-09 DIAGNOSIS — Z923 Personal history of irradiation: Secondary | ICD-10-CM | POA: Insufficient documentation

## 2020-12-09 LAB — CBC WITH DIFFERENTIAL (CANCER CENTER ONLY)
Abs Immature Granulocytes: 0.01 10*3/uL (ref 0.00–0.07)
Basophils Absolute: 0.1 10*3/uL (ref 0.0–0.1)
Basophils Relative: 1 %
Eosinophils Absolute: 0.2 10*3/uL (ref 0.0–0.5)
Eosinophils Relative: 4 %
HCT: 38.7 % (ref 36.0–46.0)
Hemoglobin: 13.2 g/dL (ref 12.0–15.0)
Immature Granulocytes: 0 %
Lymphocytes Relative: 29 %
Lymphs Abs: 1.8 10*3/uL (ref 0.7–4.0)
MCH: 34.8 pg — ABNORMAL HIGH (ref 26.0–34.0)
MCHC: 34.1 g/dL (ref 30.0–36.0)
MCV: 102.1 fL — ABNORMAL HIGH (ref 80.0–100.0)
Monocytes Absolute: 0.5 10*3/uL (ref 0.1–1.0)
Monocytes Relative: 8 %
Neutro Abs: 3.7 10*3/uL (ref 1.7–7.7)
Neutrophils Relative %: 58 %
Platelet Count: 235 10*3/uL (ref 150–400)
RBC: 3.79 MIL/uL — ABNORMAL LOW (ref 3.87–5.11)
RDW: 11.9 % (ref 11.5–15.5)
WBC Count: 6.3 10*3/uL (ref 4.0–10.5)
nRBC: 0 % (ref 0.0–0.2)

## 2020-12-09 LAB — CMP (CANCER CENTER ONLY)
ALT: 19 U/L (ref 0–44)
AST: 15 U/L (ref 15–41)
Albumin: 3.7 g/dL (ref 3.5–5.0)
Alkaline Phosphatase: 56 U/L (ref 38–126)
Anion gap: 10 (ref 5–15)
BUN: 18 mg/dL (ref 8–23)
CO2: 26 mmol/L (ref 22–32)
Calcium: 9.5 mg/dL (ref 8.9–10.3)
Chloride: 105 mmol/L (ref 98–111)
Creatinine: 0.83 mg/dL (ref 0.44–1.00)
GFR, Estimated: >60 mL/min (ref >60)
Glucose, Bld: 88 mg/dL (ref 70–99)
Potassium: 4.4 mmol/L (ref 3.5–5.1)
Sodium: 141 mmol/L (ref 135–145)
Total Bilirubin: 0.4 mg/dL (ref 0.3–1.2)
Total Protein: 7 g/dL (ref 6.5–8.1)

## 2020-12-09 MED ORDER — DENOSUMAB 60 MG/ML ~~LOC~~ SOSY
60.0000 mg | PREFILLED_SYRINGE | Freq: Once | SUBCUTANEOUS | Status: AC
  Administered 2020-12-09: 60 mg via SUBCUTANEOUS
  Filled 2020-12-09: qty 1

## 2020-12-09 NOTE — Assessment & Plan Note (Signed)
100% HER-2 negative Ki-67 14% grade 1, Oncotype DX recurrence score 13 (ROR 10%)  Treatment summary: Tamoxifen 20 mg daily started 01/22/2014 changed to anastrozole October 2016 due to progressively worsening neuropathyAnd leg swellingwhich she attributed to tamoxifen treatment.  Quit taking medication because of osteoporosis   Occasional hot flashes well controlled with Effexor  Osteoporosis:  Bone density10/06/2017: T score -2.28 January 2020. T score -2.9 Resumed Prolia with calcium and vitamin D To be rechecked in December 2022  Surveillance: 1.Breast exam8/19/2021: No palpable lumps or nodules of concern 2.mammogram:01/21/2020: Benign breast density category C  Return to clinic inevery 6 months for Prolia and once a year for follow-up with [place]

## 2020-12-15 DIAGNOSIS — M810 Age-related osteoporosis without current pathological fracture: Secondary | ICD-10-CM | POA: Diagnosis not present

## 2020-12-15 DIAGNOSIS — Z6823 Body mass index (BMI) 23.0-23.9, adult: Secondary | ICD-10-CM | POA: Diagnosis not present

## 2020-12-15 DIAGNOSIS — Z853 Personal history of malignant neoplasm of breast: Secondary | ICD-10-CM | POA: Diagnosis not present

## 2020-12-15 DIAGNOSIS — Z0000 Encounter for general adult medical examination without abnormal findings: Secondary | ICD-10-CM | POA: Diagnosis not present

## 2020-12-15 DIAGNOSIS — E785 Hyperlipidemia, unspecified: Secondary | ICD-10-CM | POA: Diagnosis not present

## 2020-12-22 ENCOUNTER — Other Ambulatory Visit: Payer: Self-pay | Admitting: Hematology and Oncology

## 2020-12-22 DIAGNOSIS — Z1231 Encounter for screening mammogram for malignant neoplasm of breast: Secondary | ICD-10-CM

## 2020-12-25 ENCOUNTER — Other Ambulatory Visit: Payer: Self-pay | Admitting: Hematology and Oncology

## 2020-12-27 ENCOUNTER — Encounter: Payer: Self-pay | Admitting: Hematology and Oncology

## 2021-02-01 ENCOUNTER — Ambulatory Visit: Payer: Medicare PPO

## 2021-02-06 ENCOUNTER — Encounter: Payer: Self-pay | Admitting: Hematology and Oncology

## 2021-02-06 ENCOUNTER — Ambulatory Visit
Admission: RE | Admit: 2021-02-06 | Discharge: 2021-02-06 | Disposition: A | Discharge status: Home / Self Care | Payer: Medicare PPO | Source: Ambulatory Visit | Attending: Hematology and Oncology | Admitting: Hematology and Oncology

## 2021-02-06 ENCOUNTER — Other Ambulatory Visit: Payer: Self-pay

## 2021-02-06 DIAGNOSIS — Z1231 Encounter for screening mammogram for malignant neoplasm of breast: Secondary | ICD-10-CM | POA: Diagnosis not present

## 2021-02-08 DIAGNOSIS — Z23 Encounter for immunization: Secondary | ICD-10-CM | POA: Diagnosis not present

## 2021-02-09 DIAGNOSIS — C44319 Basal cell carcinoma of skin of other parts of face: Secondary | ICD-10-CM | POA: Diagnosis not present

## 2021-02-09 DIAGNOSIS — L720 Epidermal cyst: Secondary | ICD-10-CM | POA: Diagnosis not present

## 2021-02-09 DIAGNOSIS — L570 Actinic keratosis: Secondary | ICD-10-CM | POA: Diagnosis not present

## 2021-02-09 DIAGNOSIS — C4401 Basal cell carcinoma of skin of lip: Secondary | ICD-10-CM | POA: Diagnosis not present

## 2021-02-09 DIAGNOSIS — Z85828 Personal history of other malignant neoplasm of skin: Secondary | ICD-10-CM | POA: Diagnosis not present

## 2021-02-09 DIAGNOSIS — L821 Other seborrheic keratosis: Secondary | ICD-10-CM | POA: Diagnosis not present

## 2021-02-09 DIAGNOSIS — C44719 Basal cell carcinoma of skin of left lower limb, including hip: Secondary | ICD-10-CM | POA: Diagnosis not present

## 2021-02-09 DIAGNOSIS — D485 Neoplasm of uncertain behavior of skin: Secondary | ICD-10-CM | POA: Diagnosis not present

## 2021-02-28 DIAGNOSIS — H25013 Cortical age-related cataract, bilateral: Secondary | ICD-10-CM | POA: Diagnosis not present

## 2021-04-05 ENCOUNTER — Encounter (INDEPENDENT_AMBULATORY_CARE_PROVIDER_SITE_OTHER): Payer: Self-pay

## 2021-04-05 ENCOUNTER — Telehealth (INDEPENDENT_AMBULATORY_CARE_PROVIDER_SITE_OTHER): Payer: Self-pay

## 2021-04-05 ENCOUNTER — Other Ambulatory Visit (INDEPENDENT_AMBULATORY_CARE_PROVIDER_SITE_OTHER): Payer: Self-pay

## 2021-04-05 DIAGNOSIS — Z1211 Encounter for screening for malignant neoplasm of colon: Secondary | ICD-10-CM

## 2021-04-05 MED ORDER — PEG 3350-KCL-NA BICARB-NACL 420 G PO SOLR: 4000.0000 mL | ORAL | 0 refills | Status: DC

## 2021-04-05 NOTE — Telephone Encounter (Signed)
Kaitlyn Porter, CMA  

## 2021-04-05 NOTE — Telephone Encounter (Signed)
Referring MD/PCP: Willey Blade  Procedure: Tcs   Reason/Indication:  Screening   Has patient had this procedure before?  Yes, 10 yrs ago  If so, when, by whom and where?    Is there a family history of colon cancer?  no  Who?  What age when diagnosed?    Is patient diabetic? If yes, Type 1 or Type 2   no      Does patient have prosthetic heart valve or mechanical valve?  no  Do you have a pacemaker/defibrillator?  no  Has patient ever had endocarditis/atrial fibrillation? no  Does patient use oxygen? no  Has patient had joint replacement within last 12 months?  no  Is patient constipated or do they take laxatives? no  Does patient have a history of alcohol/drug use?  no  Have you had a stroke/heart attack last 6 mths? no  Do you take medicine for weight loss?  no  For female patients,: do you still have your menstrual cycle? no  Is patient on blood thinner such as Coumadin, Plavix and/or Aspirin? no  Medications: venlafaxine 37.5 mg daily, l-lysine 1000 mg daily, fish oil 1400 mg daily, one a day vitamin, calcium bid  Allergies: nkda  Medication Adjustment per Dr Laural Golden nkda  Procedure date & time: Thursday 04/27/21 at 8:05

## 2021-04-27 ENCOUNTER — Encounter (HOSPITAL_COMMUNITY): Payer: Self-pay

## 2021-04-27 ENCOUNTER — Encounter (HOSPITAL_COMMUNITY): Payer: Self-pay | Admitting: Internal Medicine

## 2021-04-27 ENCOUNTER — Other Ambulatory Visit: Payer: Self-pay

## 2021-04-27 ENCOUNTER — Encounter (INDEPENDENT_AMBULATORY_CARE_PROVIDER_SITE_OTHER): Payer: Self-pay | Admitting: *Deleted

## 2021-04-27 ENCOUNTER — Encounter (HOSPITAL_COMMUNITY)
Admission: RE | Disposition: A | Discharge status: Home / Self Care | Payer: Self-pay | Source: Home / Self Care | Attending: Internal Medicine

## 2021-04-27 ENCOUNTER — Ambulatory Visit (HOSPITAL_COMMUNITY)
Admission: RE | Admit: 2021-04-27 | Discharge: 2021-04-27 | Disposition: A | Discharge status: Home / Self Care | Payer: Medicare PPO | Attending: Internal Medicine | Admitting: Internal Medicine

## 2021-04-27 DIAGNOSIS — Z1211 Encounter for screening for malignant neoplasm of colon: Secondary | ICD-10-CM | POA: Diagnosis not present

## 2021-04-27 DIAGNOSIS — Z85828 Personal history of other malignant neoplasm of skin: Secondary | ICD-10-CM | POA: Insufficient documentation

## 2021-04-27 DIAGNOSIS — Z853 Personal history of malignant neoplasm of breast: Secondary | ICD-10-CM | POA: Insufficient documentation

## 2021-04-27 DIAGNOSIS — K648 Other hemorrhoids: Secondary | ICD-10-CM | POA: Insufficient documentation

## 2021-04-27 DIAGNOSIS — K644 Residual hemorrhoidal skin tags: Secondary | ICD-10-CM | POA: Insufficient documentation

## 2021-04-27 HISTORY — PX: COLONOSCOPY: SHX5424

## 2021-04-27 LAB — HM COLONOSCOPY

## 2021-04-27 SURGERY — COLONOSCOPY
Anesthesia: Moderate Sedation

## 2021-04-27 MED ORDER — MIDAZOLAM HCL 5 MG/5ML IJ SOLN
INTRAMUSCULAR | Status: DC | PRN
Start: 2021-04-27 — End: 2021-04-27
  Administered 2021-04-27 (×2): 1 mg via INTRAVENOUS
  Administered 2021-04-27: 2 mg via INTRAVENOUS
  Administered 2021-04-27 (×2): 1 mg via INTRAVENOUS

## 2021-04-27 MED ORDER — SODIUM CHLORIDE 0.9 % IV SOLN: INTRAVENOUS | Status: DC

## 2021-04-27 MED ORDER — MEPERIDINE HCL 50 MG/ML IJ SOLN
INTRAMUSCULAR | Status: DC | PRN
  Administered 2021-04-27: 10 mg
  Administered 2021-04-27 (×2): 20 mg

## 2021-04-27 MED ORDER — MEPERIDINE HCL 50 MG/ML IJ SOLN
INTRAMUSCULAR | Status: AC
  Filled 2021-04-27: qty 1

## 2021-04-27 MED ORDER — MIDAZOLAM HCL 5 MG/5ML IJ SOLN
INTRAMUSCULAR | Status: AC
  Filled 2021-04-27: qty 10

## 2021-04-27 NOTE — Discharge Instructions (Addendum)
Resume usual medications and diet as before. No driving for 24 hours. No more screening colonoscopies.

## 2021-04-27 NOTE — Op Note (Signed)
[facility] Patient Name: Kaitlyn Porter Procedure Date: 04/27/2021 8:19 AM MRN: [id number] Date of Birth: December 30, 1945 Attending MD: Hildred Laser , MD CSN: [id number] Age: 76 Admit Type: Outpatient Procedure:                Colonoscopy Indications:              Screening for colorectal malignant neoplasm Providers:                Hildred Laser, MD, Janeece Riggers, RN, Hughie Closs,                            RN, Randa Spike, Technician Referring MD:             Asencion Noble, MD Medicines:                Meperidine 50 mg IV, Midazolam 6 mg IV Complications:            No immediate complications. Estimated Blood Loss:     Estimated blood loss: none. Procedure:                Pre-Anesthesia Assessment:                           - Prior to the procedure, a History and Physical                            was performed, and patient medications and                            allergies were reviewed. The patient's tolerance of                            previous anesthesia was also reviewed. The risks                            and benefits of the procedure and the sedation                            options and risks were discussed with the patient.                            All questions were answered, and informed consent                            was obtained. Prior Anticoagulants: The patient has                            taken no previous anticoagulant or antiplatelet                            agents. ASA Grade Assessment: II - A patient with                            mild systemic disease. After reviewing the risks  and benefits, the patient was deemed in                            satisfactory condition to undergo the procedure.                           After obtaining informed consent, the colonoscope                            was passed under direct vision. Throughout the                            procedure, the patient's blood pressure, pulse, and                             oxygen saturations were monitored continuously. The                            [id number]-[id number] ([id number]) scope was introduced through                            the anus and advanced to the the cecum, identified                            by appendiceal orifice and ileocecal valve. The                            colonoscopy was somewhat difficult due to                            significant looping. Successful completion of the                            procedure was aided by withdrawing the scope and                            replacing with the 'babyscope'. Scope In: 8:56:23 AM Scope Out: 9:25:52 AM Scope Withdrawal Time: 0 hours 4 minutes 12 seconds  Total Procedure Duration: 0 hours 29 minutes 29 seconds  Findings:      Skin tags were found on perianal exam.      The colon (entire examined portion) appeared normal.      External and internal hemorrhoids were found during retroflexion. The       hemorrhoids were small. Impression:               - Perianal skin tags found on perianal exam.                           - The entire examined colon is normal.                           - External and internal hemorrhoids.                           - No  specimens collected. Moderate Sedation:      Moderate (conscious) sedation was administered by the endoscopy nurse       and supervised by the endoscopist. The following parameters were       monitored: oxygen saturation, heart rate, blood pressure, CO2       capnography and response to care. Total physician intraservice time was       34 minutes. Recommendation:           - Patient has a contact number available for                            emergencies. The signs and symptoms of potential                            delayed complications were discussed with the                            patient. Return to normal activities tomorrow.                            Written discharge instructions were provided to the                             patient.                           - Resume previous diet today.                           - Continue present medications.                           - No repeat colonoscopy due to age. Procedure Code(s):        --- Professional ---                           [phone], Colonoscopy, flexible; diagnostic, including                            collection of specimen(s) by brushing or washing,                            when performed (separate procedure)                           99153, Moderate sedation; each additional 15                            minutes intraservice time                           G0500, Moderate sedation services provided by the                            same physician or other qualified health care  professional performing a gastrointestinal                            endoscopic service that sedation supports,                            requiring the presence of an independent trained                            observer to assist in the monitoring of the                            patient's level of consciousness and physiological                            status; initial 15 minutes of intra-service time;                            patient age 8 years or older (additional time may                            be reported with [phone], as appropriate) Diagnosis Code(s):        --- Professional ---                           Z12.11, Encounter for screening for malignant                            neoplasm of colon                           K64.8, Other hemorrhoids                           K64.4, Residual hemorrhoidal skin tags CPT copyright 2019 American Medical Association. All rights reserved. The codes documented in this report are preliminary and upon coder review may  be revised to meet current compliance requirements. Hildred Laser, MD Hildred Laser, MD 04/27/2021 9:32:03 AM This report has been signed electronically. Number  of Addenda: 0

## 2021-04-27 NOTE — H&P (Signed)
Kaitlyn Porter is an 76 y.o. female.   Chief Complaint: Patient is here for colonoscopy. HPI: Patient is 76 year old Caucasian female who is here for screening colonoscopy. Last exam was normal in August 2012.  She denies abdominal pain change in bowel habits or rectal bleeding.  Family history is negative for colon carcinoma. Personal history is significant for breast carcinoma and she remains in remission.  She also has has had basal cell skin carcinoma lesion removed. She does not take aspirin or NSAIDs.  Past Medical History:  Diagnosis Date   Breast cancer, right breast [facility]) August 2015   lumpectomy and radiation therapy   Fibroids 12/2008   history of   Osteoporosis    Personal history of radiation therapy    Skin cancer    basal cell    Past Surgical History:  Procedure Laterality Date   APPENDECTOMY     BREAST EXCISIONAL BIOPSY Right    BREAST LUMPECTOMY Right 2015   BREAST SURGERY  2015   right lumpectomy for breast cancer   COLONOSCOPY  10 years ago   dr Laural Golden   COLONOSCOPY  12/07/2010   Procedure: COLONOSCOPY;  Surgeon: Rogene Houston, MD;  Location: [facility];  Service: Endoscopy;  Laterality: N/A;   TOE ARTHROSCOPY  2005   rt great toe spurs   TONSILLECTOMY     TUBAL LIGATION  1976   VAGINAL DELIVERY     times 2    Family History  Problem Relation Age of Onset   Diabetes Brother    Heart failure Mother    Aneurysm Mother        abdominal   Cancer Maternal Grandfather    Social History:  reports that she quit smoking about 47 years ago. She has a 2.50 pack-year smoking history. She has never used smokeless tobacco. She reports that she does not drink alcohol and does not use drugs.  Allergies:  Allergies  Allergen Reactions   Latex Itching    Medications Prior to Admission  Medication Sig Dispense Refill   CALCIUM PO Take 1 tablet by mouth in the morning and at bedtime.     L-Lysine 1000 MG TABS Take 1,000 mg by mouth daily.     Multiple  Vitamin (MULTIVITAMIN) capsule Take 1 capsule by mouth daily.     Omega-3 Fatty Acids (FISH OIL ULTRA) 1400 MG CAPS Take 1,400 mg by mouth daily.     polyethylene glycol-electrolytes (TRILYTE) 420 g solution Take 4,000 mLs by mouth as directed. 4000 mL 0   venlafaxine XR (EFFEXOR-XR) 37.5 MG 24 hr capsule TAKE 1 CAPSULE EVERY DAY 90 capsule 3    No results found for this or any previous visit (from the past 48 hour(s)). No results found.  Review of Systems  Blood pressure (!) 153/94, temperature 97.9 F (36.6 C), temperature source Oral, resp. rate 16, height 5' 3.5" (1.613 m), weight 61.7 kg, last menstrual period 04/23/1993, SpO2 99 %. Physical Exam HENT:     Mouth/Throat:     Mouth: Mucous membranes are moist.     Pharynx: Oropharynx is clear.  Eyes:     General: No scleral icterus.    Conjunctiva/sclera: Conjunctivae normal.  Cardiovascular:     Rate and Rhythm: Normal rate and regular rhythm.     Heart sounds: Normal heart sounds. No murmur heard. Pulmonary:     Effort: Pulmonary effort is normal.     Breath sounds: Normal breath sounds.  Abdominal:  General: There is no distension.     Palpations: Abdomen is soft. There is no mass.     Tenderness: There is no abdominal tenderness.  Musculoskeletal:        General: No swelling.     Cervical back: Neck supple.  Lymphadenopathy:     Cervical: No cervical adenopathy.  Skin:    General: Skin is warm and dry.  Neurological:     Mental Status: She is alert.     Assessment/Plan  Average risk screening colonoscopy.  Hildred Laser, MD 04/27/2021, 8:44 AM

## 2021-05-03 ENCOUNTER — Encounter (HOSPITAL_COMMUNITY): Payer: Self-pay | Admitting: Internal Medicine

## 2021-06-09 ENCOUNTER — Other Ambulatory Visit: Payer: Self-pay

## 2021-06-09 DIAGNOSIS — C50211 Malignant neoplasm of upper-inner quadrant of right female breast: Secondary | ICD-10-CM

## 2021-06-09 DIAGNOSIS — M810 Age-related osteoporosis without current pathological fracture: Secondary | ICD-10-CM

## 2021-06-12 ENCOUNTER — Inpatient Hospital Stay: Payer: Medicare PPO

## 2021-06-12 ENCOUNTER — Inpatient Hospital Stay: Payer: Medicare PPO | Attending: Hematology and Oncology

## 2021-06-12 ENCOUNTER — Other Ambulatory Visit: Payer: Self-pay

## 2021-06-12 VITALS — BP 155/81 | HR 78 | Temp 98.4°F | Resp 16

## 2021-06-12 DIAGNOSIS — Z170 Estrogen receptor positive status [ER+]: Secondary | ICD-10-CM | POA: Insufficient documentation

## 2021-06-12 DIAGNOSIS — Z79899 Other long term (current) drug therapy: Secondary | ICD-10-CM | POA: Diagnosis not present

## 2021-06-12 DIAGNOSIS — Z79811 Long term (current) use of aromatase inhibitors: Secondary | ICD-10-CM | POA: Diagnosis not present

## 2021-06-12 DIAGNOSIS — M810 Age-related osteoporosis without current pathological fracture: Secondary | ICD-10-CM

## 2021-06-12 DIAGNOSIS — C50211 Malignant neoplasm of upper-inner quadrant of right female breast: Secondary | ICD-10-CM | POA: Diagnosis not present

## 2021-06-12 LAB — CBC WITH DIFFERENTIAL (CANCER CENTER ONLY)
Abs Immature Granulocytes: 0.01 10*3/uL (ref 0.00–0.07)
Basophils Absolute: 0.1 10*3/uL (ref 0.0–0.1)
Basophils Relative: 1 %
Eosinophils Absolute: 0.1 10*3/uL (ref 0.0–0.5)
Eosinophils Relative: 2 %
HCT: 40.9 % (ref 36.0–46.0)
Hemoglobin: 14 g/dL (ref 12.0–15.0)
Immature Granulocytes: 0 %
Lymphocytes Relative: 34 %
Lymphs Abs: 2.4 10*3/uL (ref 0.7–4.0)
MCH: 34.9 pg — ABNORMAL HIGH (ref 26.0–34.0)
MCHC: 34.2 g/dL (ref 30.0–36.0)
MCV: 102 fL — ABNORMAL HIGH (ref 80.0–100.0)
Monocytes Absolute: 0.4 10*3/uL (ref 0.1–1.0)
Monocytes Relative: 5 %
Neutro Abs: 4.1 10*3/uL (ref 1.7–7.7)
Neutrophils Relative %: 58 %
Platelet Count: 242 10*3/uL (ref 150–400)
RBC: 4.01 MIL/uL (ref 3.87–5.11)
RDW: 11.8 % (ref 11.5–15.5)
WBC Count: 7.1 10*3/uL (ref 4.0–10.5)
nRBC: 0 % (ref 0.0–0.2)

## 2021-06-12 LAB — CMP (CANCER CENTER ONLY)
ALT: 18 U/L (ref 0–44)
AST: 15 U/L (ref 15–41)
Albumin: 4.2 g/dL (ref 3.5–5.0)
Alkaline Phosphatase: 49 U/L (ref 38–126)
Anion gap: 5 (ref 5–15)
BUN: 22 mg/dL (ref 8–23)
CO2: 30 mmol/L (ref 22–32)
Calcium: 9.5 mg/dL (ref 8.9–10.3)
Chloride: 104 mmol/L (ref 98–111)
Creatinine: 0.84 mg/dL (ref 0.44–1.00)
GFR, Estimated: >60 mL/min (ref >60)
Glucose, Bld: 112 mg/dL — ABNORMAL HIGH (ref 70–99)
Potassium: 4.1 mmol/L (ref 3.5–5.1)
Sodium: 139 mmol/L (ref 135–145)
Total Bilirubin: 0.3 mg/dL (ref 0.3–1.2)
Total Protein: 7.2 g/dL (ref 6.5–8.1)

## 2021-06-12 MED ORDER — DENOSUMAB 60 MG/ML ~~LOC~~ SOSY
60.0000 mg | PREFILLED_SYRINGE | Freq: Once | SUBCUTANEOUS | Status: AC
  Administered 2021-06-12: 60 mg via SUBCUTANEOUS
  Filled 2021-06-12: qty 1

## 2021-11-27 DIAGNOSIS — C50911 Malignant neoplasm of unspecified site of right female breast: Secondary | ICD-10-CM | POA: Diagnosis not present

## 2021-12-07 NOTE — Progress Notes (Signed)
Patient Care Team: Asencion Noble, MD as PCP - General (Internal Medicine)  DIAGNOSIS:  Encounter Diagnoses  Name Primary?   Malignant neoplasm of upper-inner quadrant of right breast in female, estrogen receptor positive ([place])    Post-menopausal Yes    SUMMARY OF ONCOLOGIC HISTORY: Oncology History  Breast cancer of upper-inner quadrant of right female breast ([place])  12/11/2013 Breast MRI   Right breast upper inner quadrant 2.1 x 1.6 x 1.6 cm oval mass no abnormal lymph nodes   12/14/2013 Initial Diagnosis   Breast cancer of upper-inner quadrant of right female breast   12/23/2013 Surgery   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, 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, lymphovascular invasion is found, for sentinel lymph nodes negative ER 100% PR 100% HER-2 negative ratio 1.1, Ki-67 14%; Oncotype DX score of 15 (10% ROR)   02/15/2014 - 04/02/2014 Radiation Therapy   Adjuvant radiation therapy   04/06/2014 -  Anti-estrogen oral therapy   Tamoxifen 20 mg daily (developed neuropathy), switched to anastrozole 1 mg daily 01/24/2015     CHIEF COMPLIANT: Follow-up of right breast cancer on Prolia    INTERVAL HISTORY: Kaitlyn Porter is a 76  y.o. with above-mentioned history of right breast cancer treated with lumpectomy, radiation, and who is currently on Prolia. She states that she have not notice any difference from when she stopped the anastrozole. She is doing pretty good.   ALLERGIES:  is allergic to latex.  MEDICATIONS:  Current Outpatient Medications  Medication Sig Dispense Refill   CALCIUM PO Take 1 tablet by mouth in the morning and at bedtime.     L-Lysine 1000 MG TABS Take 1,000 mg by mouth daily.     Multiple Vitamin (MULTIVITAMIN) capsule Take 1 capsule by mouth daily.     Omega-3 Fatty Acids (FISH OIL ULTRA) 1400 MG CAPS Take 1,400 mg by mouth daily.     venlafaxine XR (EFFEXOR-XR) 37.5 MG 24 hr capsule TAKE 1 CAPSULE EVERY DAY 90 capsule 3   No current  facility-administered medications for this visit.   Facility-Administered Medications Ordered in Other Visits  Medication Dose Route Frequency Provider Last Rate Last Admin   denosumab (PROLIA) injection 60 mg  60 mg Subcutaneous Once Nicholas Lose, MD        PHYSICAL EXAMINATION: ECOG PERFORMANCE STATUS: 1 - Symptomatic but completely ambulatory  Vitals:   12/11/21 1152  BP: 133/80  Pulse: 77  Resp: 18  Temp: 97.9 F (36.6 C)  SpO2: 99%   Filed Weights   12/11/21 1152  Weight: 139 lb (63 kg)    BREAST: No palpable masses or nodules in either right or left breasts. No palpable axillary supraclavicular or infraclavicular adenopathy no breast tenderness or nipple discharge. (exam performed in the presence of a chaperone)  LABORATORY DATA:  I have reviewed the data as listed    Latest Ref Rng & Units 12/11/2021   11:35 AM 06/12/2021   12:48 PM 12/09/2020    9:40 AM  CMP  Glucose 70 - 99 mg/dL 95  112  88   BUN 8 - 23 mg/dL _0 Creatinine 0.44 - 1.00 mg/dL 0.82  0.84  0.83   Sodium 135 - 145 mmol/L 137  139  141   Potassium 3.5 - 5.1 mmol/L 4.6  4.1  4.4   Chloride 98 - 111 mmol/L 106  104  105   CO2 22 - 32 mmol/L 29  30  26  Calcium 8.9 - 10.3 mg/dL 9.6  9.5  9.5   Total Protein 6.5 - 8.1 g/dL 6.8  7.2  7.0   Total Bilirubin 0.3 - 1.2 mg/dL 0.4  0.3  0.4   Alkaline Phos 38 - 126 U/L 48  49  56   AST 15 - 41 U/L _0 ALT 0 - 44 U/L _1 Lab Results  Component Value Date   WBC 6.0 12/11/2021   HGB 13.6 12/11/2021   HCT 38.3 12/11/2021   MCV 100.5 (H) 12/11/2021   PLT 224 12/11/2021   NEUTROABS 3.5 12/11/2021    ASSESSMENT & PLAN:  Breast cancer of upper-inner quadrant of right female breast 100% HER-2 negative Ki-67 14% grade 1, Oncotype DX recurrence score 13 (ROR 10%)   Treatment summary: Tamoxifen 20 mg daily started 01/22/2014 changed to anastrozole October 2016 completed 2022    Occasional hot flashes well controlled with  Effexor   Osteoporosis:  Bone density 01/23/2018: T score -2.28 January 2020. T score -2.9 Resumed Prolia with calcium and vitamin D To be rechecked in December 2023    Surveillance: 1.  Breast exam 12/11/2021: No palpable lumps or nodules of concern 2. mammogram: 02/10/2021: Benign breast density category C   Return to clinic in every 6 months for Prolia and once a year for follow-up with [place]    Orders Placed This Encounter  Procedures   DG Bone Density    Standing Status:   Future    Standing Expiration Date:   12/11/2022    Scheduling Instructions:     Do it with her mammograms    Order Specific Question:   Reason for Exam (SYMPTOM  OR DIAGNOSIS REQUIRED)    Answer:   post menopausal osteoporosis on prolia    Order Specific Question:   Preferred imaging location?    Answer:   [facility]    Order Specific Question:   Release to patient    Answer:   Immediate   The patient has a good understanding of the overall plan. she agrees with it. she will call with any problems that may develop before the next visit here. Total time spent: 30 mins including face to face time and time spent for planning, charting and co-ordination of care   Harriette Ohara, MD 12/11/21    I Gardiner Coins am scribing for Dr. Lindi Adie  I have reviewed the above documentation for accuracy and completeness, and I agree with the above.

## 2021-12-08 ENCOUNTER — Other Ambulatory Visit: Payer: Self-pay | Admitting: *Deleted

## 2021-12-08 DIAGNOSIS — Z170 Estrogen receptor positive status [ER+]: Secondary | ICD-10-CM

## 2021-12-08 DIAGNOSIS — M810 Age-related osteoporosis without current pathological fracture: Secondary | ICD-10-CM

## 2021-12-11 ENCOUNTER — Inpatient Hospital Stay: Payer: Medicare PPO

## 2021-12-11 ENCOUNTER — Inpatient Hospital Stay: Payer: Medicare PPO | Attending: Hematology and Oncology

## 2021-12-11 ENCOUNTER — Other Ambulatory Visit: Payer: Self-pay

## 2021-12-11 ENCOUNTER — Inpatient Hospital Stay: Payer: Medicare PPO | Admitting: Hematology and Oncology

## 2021-12-11 VITALS — BP 133/80 | HR 77 | Temp 97.9°F | Resp 18 | Ht 63.5 in | Wt 139.0 lb

## 2021-12-11 DIAGNOSIS — Z170 Estrogen receptor positive status [ER+]: Secondary | ICD-10-CM | POA: Diagnosis not present

## 2021-12-11 DIAGNOSIS — M810 Age-related osteoporosis without current pathological fracture: Secondary | ICD-10-CM | POA: Insufficient documentation

## 2021-12-11 DIAGNOSIS — C50211 Malignant neoplasm of upper-inner quadrant of right female breast: Secondary | ICD-10-CM

## 2021-12-11 DIAGNOSIS — Z780 Asymptomatic menopausal state: Secondary | ICD-10-CM

## 2021-12-11 LAB — CBC WITH DIFFERENTIAL (CANCER CENTER ONLY)
Abs Immature Granulocytes: 0.01 10*3/uL (ref 0.00–0.07)
Basophils Absolute: 0 10*3/uL (ref 0.0–0.1)
Basophils Relative: 1 %
Eosinophils Absolute: 0.1 10*3/uL (ref 0.0–0.5)
Eosinophils Relative: 1 %
HCT: 38.3 % (ref 36.0–46.0)
Hemoglobin: 13.6 g/dL (ref 12.0–15.0)
Immature Granulocytes: 0 %
Lymphocytes Relative: 34 %
Lymphs Abs: 2 10*3/uL (ref 0.7–4.0)
MCH: 35.7 pg — ABNORMAL HIGH (ref 26.0–34.0)
MCHC: 35.5 g/dL (ref 30.0–36.0)
MCV: 100.5 fL — ABNORMAL HIGH (ref 80.0–100.0)
Monocytes Absolute: 0.4 10*3/uL (ref 0.1–1.0)
Monocytes Relative: 6 %
Neutro Abs: 3.5 10*3/uL (ref 1.7–7.7)
Neutrophils Relative %: 58 %
Platelet Count: 224 10*3/uL (ref 150–400)
RBC: 3.81 MIL/uL — ABNORMAL LOW (ref 3.87–5.11)
RDW: 11.8 % (ref 11.5–15.5)
WBC Count: 6 10*3/uL (ref 4.0–10.5)
nRBC: 0 % (ref 0.0–0.2)

## 2021-12-11 LAB — CMP (CANCER CENTER ONLY)
ALT: 15 U/L (ref 0–44)
AST: 13 U/L — ABNORMAL LOW (ref 15–41)
Albumin: 4.2 g/dL (ref 3.5–5.0)
Alkaline Phosphatase: 48 U/L (ref 38–126)
Anion gap: 2 — ABNORMAL LOW (ref 5–15)
BUN: 17 mg/dL (ref 8–23)
CO2: 29 mmol/L (ref 22–32)
Calcium: 9.6 mg/dL (ref 8.9–10.3)
Chloride: 106 mmol/L (ref 98–111)
Creatinine: 0.82 mg/dL (ref 0.44–1.00)
GFR, Estimated: >60 mL/min (ref >60)
Glucose, Bld: 95 mg/dL (ref 70–99)
Potassium: 4.6 mmol/L (ref 3.5–5.1)
Sodium: 137 mmol/L (ref 135–145)
Total Bilirubin: 0.4 mg/dL (ref 0.3–1.2)
Total Protein: 6.8 g/dL (ref 6.5–8.1)

## 2021-12-11 MED ORDER — DENOSUMAB 60 MG/ML ~~LOC~~ SOSY
60.0000 mg | PREFILLED_SYRINGE | Freq: Once | SUBCUTANEOUS | Status: AC
  Administered 2021-12-11: 60 mg via SUBCUTANEOUS
  Filled 2021-12-11: qty 1

## 2021-12-11 NOTE — Assessment & Plan Note (Addendum)
100% HER-2 negative Ki-67 14% grade 1, Oncotype DX recurrence score 13 (ROR 10%)  Treatment summary: Tamoxifen 20 mg daily started 01/22/2014 changed to anastrozole October 2016 completed 2022   Occasional hot flashes well controlled with Effexor  Osteoporosis:  Bone density10/06/2017: T score -2.28 January 2020.T score -2.9 Resumed Prolia with calcium and vitamin D To be rechecked in December 2023  Surveillance: 1.Breast exam8/21/2023: No palpable lumps or nodules of concern 2.mammogram:02/10/2021: Benign breast density category C  Return to clinic inevery 6 months for Prolia and once a year for follow-up with [place]

## 2021-12-12 DIAGNOSIS — Z79899 Other long term (current) drug therapy: Secondary | ICD-10-CM | POA: Diagnosis not present

## 2021-12-12 DIAGNOSIS — C50011 Malignant neoplasm of nipple and areola, right female breast: Secondary | ICD-10-CM | POA: Diagnosis not present

## 2021-12-12 DIAGNOSIS — M810 Age-related osteoporosis without current pathological fracture: Secondary | ICD-10-CM | POA: Diagnosis not present

## 2021-12-12 DIAGNOSIS — G4700 Insomnia, unspecified: Secondary | ICD-10-CM | POA: Diagnosis not present

## 2021-12-12 DIAGNOSIS — E785 Hyperlipidemia, unspecified: Secondary | ICD-10-CM | POA: Diagnosis not present

## 2021-12-14 DIAGNOSIS — M810 Age-related osteoporosis without current pathological fracture: Secondary | ICD-10-CM | POA: Diagnosis not present

## 2021-12-14 DIAGNOSIS — N950 Postmenopausal bleeding: Secondary | ICD-10-CM | POA: Diagnosis not present

## 2021-12-14 DIAGNOSIS — E785 Hyperlipidemia, unspecified: Secondary | ICD-10-CM | POA: Diagnosis not present

## 2021-12-14 DIAGNOSIS — Z853 Personal history of malignant neoplasm of breast: Secondary | ICD-10-CM | POA: Diagnosis not present

## 2021-12-14 DIAGNOSIS — Z0000 Encounter for general adult medical examination without abnormal findings: Secondary | ICD-10-CM | POA: Diagnosis not present

## 2021-12-14 DIAGNOSIS — Z6824 Body mass index (BMI) 24.0-24.9, adult: Secondary | ICD-10-CM | POA: Diagnosis not present

## 2022-01-12 ENCOUNTER — Other Ambulatory Visit: Payer: Self-pay | Admitting: Obstetrics and Gynecology

## 2022-01-12 ENCOUNTER — Other Ambulatory Visit: Payer: Self-pay | Admitting: Hematology and Oncology

## 2022-01-12 DIAGNOSIS — Z1231 Encounter for screening mammogram for malignant neoplasm of breast: Secondary | ICD-10-CM

## 2022-01-30 DIAGNOSIS — Z23 Encounter for immunization: Secondary | ICD-10-CM | POA: Diagnosis not present

## 2022-02-13 ENCOUNTER — Ambulatory Visit
Admission: RE | Admit: 2022-02-13 | Discharge: 2022-02-13 | Disposition: A | Discharge status: Home / Self Care | Payer: Medicare PPO | Source: Ambulatory Visit | Attending: Obstetrics and Gynecology | Admitting: Obstetrics and Gynecology

## 2022-02-13 DIAGNOSIS — Z1231 Encounter for screening mammogram for malignant neoplasm of breast: Secondary | ICD-10-CM | POA: Diagnosis not present

## 2022-02-14 DIAGNOSIS — Z85828 Personal history of other malignant neoplasm of skin: Secondary | ICD-10-CM | POA: Diagnosis not present

## 2022-02-14 DIAGNOSIS — D485 Neoplasm of uncertain behavior of skin: Secondary | ICD-10-CM | POA: Diagnosis not present

## 2022-02-14 DIAGNOSIS — D0461 Carcinoma in situ of skin of right upper limb, including shoulder: Secondary | ICD-10-CM | POA: Diagnosis not present

## 2022-02-14 DIAGNOSIS — D2261 Melanocytic nevi of right upper limb, including shoulder: Secondary | ICD-10-CM | POA: Diagnosis not present

## 2022-02-14 DIAGNOSIS — D225 Melanocytic nevi of trunk: Secondary | ICD-10-CM | POA: Diagnosis not present

## 2022-02-14 DIAGNOSIS — C44612 Basal cell carcinoma of skin of right upper limb, including shoulder: Secondary | ICD-10-CM | POA: Diagnosis not present

## 2022-02-14 DIAGNOSIS — L821 Other seborrheic keratosis: Secondary | ICD-10-CM | POA: Diagnosis not present

## 2022-02-14 DIAGNOSIS — L570 Actinic keratosis: Secondary | ICD-10-CM | POA: Diagnosis not present

## 2022-02-14 DIAGNOSIS — D044 Carcinoma in situ of skin of scalp and neck: Secondary | ICD-10-CM | POA: Diagnosis not present

## 2022-02-14 DIAGNOSIS — D2262 Melanocytic nevi of left upper limb, including shoulder: Secondary | ICD-10-CM | POA: Diagnosis not present

## 2022-03-01 DIAGNOSIS — H04123 Dry eye syndrome of bilateral lacrimal glands: Secondary | ICD-10-CM | POA: Diagnosis not present

## 2022-06-01 ENCOUNTER — Encounter: Payer: Self-pay | Admitting: Hematology and Oncology

## 2022-06-01 ENCOUNTER — Ambulatory Visit
Admission: RE | Admit: 2022-06-01 | Discharge: 2022-06-01 | Disposition: A | Discharge status: Home / Self Care | Payer: Medicare PPO | Source: Ambulatory Visit | Attending: Hematology and Oncology | Admitting: Hematology and Oncology

## 2022-06-01 ENCOUNTER — Telehealth: Payer: Self-pay

## 2022-06-01 DIAGNOSIS — Z780 Asymptomatic menopausal state: Secondary | ICD-10-CM

## 2022-06-01 DIAGNOSIS — M85852 Other specified disorders of bone density and structure, left thigh: Secondary | ICD-10-CM | POA: Diagnosis not present

## 2022-06-01 DIAGNOSIS — M810 Age-related osteoporosis without current pathological fracture: Secondary | ICD-10-CM | POA: Diagnosis not present

## 2022-06-01 NOTE — Telephone Encounter (Signed)
Called Pt and given below message, verified next appt 2/21 at 1100. Pt verbalized understanding with no further questions at this time.

## 2022-06-01 NOTE — Telephone Encounter (Signed)
-----   Message from Gardenia Phlegm, NP sent at 06/01/2022  1:55 PM EST ----- Osteoporosis still present but slightly improved please let patient know we want her to continue Prolia. ----- Message ----- From: Interface, Rad Results In Sent: 06/01/2022  12:01 PM EST To: Nicholas Lose, MD

## 2022-06-07 ENCOUNTER — Other Ambulatory Visit: Payer: Self-pay | Admitting: *Deleted

## 2022-06-07 ENCOUNTER — Telehealth: Payer: Self-pay | Admitting: Hematology and Oncology

## 2022-06-07 DIAGNOSIS — Z780 Asymptomatic menopausal state: Secondary | ICD-10-CM

## 2022-06-07 NOTE — Telephone Encounter (Signed)
Scheduled appointment per staff message. Patient is aware of the made appointment.

## 2022-06-11 ENCOUNTER — Other Ambulatory Visit: Payer: Self-pay

## 2022-06-11 DIAGNOSIS — C50211 Malignant neoplasm of upper-inner quadrant of right female breast: Secondary | ICD-10-CM

## 2022-06-13 ENCOUNTER — Inpatient Hospital Stay: Payer: Medicare PPO | Attending: Hematology and Oncology

## 2022-06-13 ENCOUNTER — Inpatient Hospital Stay: Payer: Medicare PPO

## 2022-06-13 ENCOUNTER — Other Ambulatory Visit: Payer: Self-pay | Admitting: Hematology and Oncology

## 2022-06-13 ENCOUNTER — Other Ambulatory Visit: Payer: Self-pay

## 2022-06-13 VITALS — BP 166/85 | HR 76 | Temp 98.5°F | Resp 16

## 2022-06-13 DIAGNOSIS — M810 Age-related osteoporosis without current pathological fracture: Secondary | ICD-10-CM | POA: Insufficient documentation

## 2022-06-13 DIAGNOSIS — Z853 Personal history of malignant neoplasm of breast: Secondary | ICD-10-CM | POA: Insufficient documentation

## 2022-06-13 DIAGNOSIS — Z79899 Other long term (current) drug therapy: Secondary | ICD-10-CM | POA: Insufficient documentation

## 2022-06-13 DIAGNOSIS — Z170 Estrogen receptor positive status [ER+]: Secondary | ICD-10-CM

## 2022-06-13 LAB — CBC WITH DIFFERENTIAL (CANCER CENTER ONLY)
Abs Immature Granulocytes: 0.01 10*3/uL (ref 0.00–0.07)
Basophils Absolute: 0.1 10*3/uL (ref 0.0–0.1)
Basophils Relative: 1 %
Eosinophils Absolute: 0.1 10*3/uL (ref 0.0–0.5)
Eosinophils Relative: 2 %
HCT: 40.5 % (ref 36.0–46.0)
Hemoglobin: 14 g/dL (ref 12.0–15.0)
Immature Granulocytes: 0 %
Lymphocytes Relative: 34 %
Lymphs Abs: 2 10*3/uL (ref 0.7–4.0)
MCH: 35.4 pg — ABNORMAL HIGH (ref 26.0–34.0)
MCHC: 34.6 g/dL (ref 30.0–36.0)
MCV: 102.3 fL — ABNORMAL HIGH (ref 80.0–100.0)
Monocytes Absolute: 0.4 10*3/uL (ref 0.1–1.0)
Monocytes Relative: 6 %
Neutro Abs: 3.4 10*3/uL (ref 1.7–7.7)
Neutrophils Relative %: 57 %
Platelet Count: 216 10*3/uL (ref 150–400)
RBC: 3.96 MIL/uL (ref 3.87–5.11)
RDW: 11.6 % (ref 11.5–15.5)
WBC Count: 5.9 10*3/uL (ref 4.0–10.5)
nRBC: 0 % (ref 0.0–0.2)

## 2022-06-13 LAB — CMP (CANCER CENTER ONLY)
ALT: 16 U/L (ref 0–44)
AST: 17 U/L (ref 15–41)
Albumin: 4.2 g/dL (ref 3.5–5.0)
Alkaline Phosphatase: 46 U/L (ref 38–126)
Anion gap: 5 (ref 5–15)
BUN: 17 mg/dL (ref 8–23)
CO2: 30 mmol/L (ref 22–32)
Calcium: 9.7 mg/dL (ref 8.9–10.3)
Chloride: 106 mmol/L (ref 98–111)
Creatinine: 0.76 mg/dL (ref 0.44–1.00)
GFR, Estimated: >60 mL/min (ref >60)
Glucose, Bld: 96 mg/dL (ref 70–99)
Potassium: 4.3 mmol/L (ref 3.5–5.1)
Sodium: 141 mmol/L (ref 135–145)
Total Bilirubin: 0.3 mg/dL (ref 0.3–1.2)
Total Protein: 7.1 g/dL (ref 6.5–8.1)

## 2022-06-13 MED ORDER — DENOSUMAB 60 MG/ML ~~LOC~~ SOSY
60.0000 mg | PREFILLED_SYRINGE | Freq: Once | SUBCUTANEOUS | Status: AC
  Administered 2022-06-13: 60 mg via SUBCUTANEOUS
  Filled 2022-06-13: qty 1

## 2022-06-13 NOTE — Patient Instructions (Signed)
Denosumab Injection (Osteoporosis) What is this medication? DENOSUMAB (den oh SUE mab) prevents and treats osteoporosis. It works by making your bones stronger and less likely to break (fracture). It is a monoclonal antibody. This medicine may be used for other purposes; ask your health care provider or pharmacist if you have questions. COMMON BRAND NAME(S): Prolia What should I tell my care team before I take this medication? They need to know if you have any of these conditions: Dental or gum disease, or plan to have dental surgery or a tooth pulled Infection Kidney disease Low levels of calcium or vitamin D in your blood On dialysis Poor nutrition Skin conditions Thyroid disease, or have had thyroid or parathyroid surgery Trouble absorbing minerals in your stomach or intestine An unusual or allergic reaction to denosumab, other medications, foods, dyes, or preservatives Pregnant or trying to get pregnant Breastfeeding How should I use this medication? This medication is injected under the skin. It is given by your care team in a hospital or clinic setting. A special MedGuide will be given to you before each treatment. Be sure to read this information carefully each time. Talk to your care team about the use of this medication in children. Special care may be needed. Overdosage: If you think you have taken too much of this medicine contact a poison control center or emergency room at once. NOTE: This medicine is only for you. Do not share this medicine with others. What if I miss a dose? Keep appointments for follow-up doses. It is important not to miss your dose. Call your care team if you are unable to keep an appointment. What may interact with this medication? Do not take this medication with any of the following: Other medications that contain denosumab This medication may also interact with the following: Medications that lower your chance of fighting infection Steroid  medications, such as prednisone or cortisone This list may not describe all possible interactions. Give your health care provider a list of all the medicines, herbs, non-prescription drugs, or dietary supplements you use. Also tell them if you smoke, drink alcohol, or use illegal drugs. Some items may interact with your medicine. What should I watch for while using this medication? Your condition will be monitored carefully while you are receiving this medication. You may need blood work while taking this medication. This medication may increase your risk of getting an infection. Call your care team for advice if you get a fever, chills, sore throat, or other symptoms of a cold or flu. Do not treat yourself. Try to avoid being around people who are sick. Tell your dentist and dental surgeon that you are taking this medication. You should not have major dental surgery while on this medication. See your dentist to have a dental exam and fix any dental problems before starting this medication. Take good care of your teeth while on this medication. Make sure you see your dentist for regular follow-up appointments. You should make sure you get enough calcium and vitamin D while you are taking this medication. Discuss the foods you eat and the vitamins you take with your care team. Talk to your care team if you are pregnant or think you might be pregnant. This medication can cause serious birth defects if taken during pregnancy and for 5 months after the last dose. You will need a negative pregnancy test before starting this medication. Contraception is recommended while taking this medication and for 5 months after the last dose. Your care   team can help you find the option that works for you. Talk to your care team before breastfeeding. Changes to your treatment plan may be needed. What side effects may I notice from receiving this medication? Side effects that you should report to your care team as soon as  possible: Allergic reactions--skin rash, itching, hives, swelling of the face, lips, tongue, or throat Infection--fever, chills, cough, sore throat, wounds that don't heal, pain or trouble when passing urine, general feeling of discomfort or being unwell Low calcium level--muscle pain or cramps, confusion, tingling, or numbness in the hands or feet Osteonecrosis of the jaw--pain, swelling, or redness in the mouth, numbness of the jaw, poor healing after dental work, unusual discharge from the mouth, visible bones in the mouth Severe bone, joint, or muscle pain Skin infection--skin redness, swelling, warmth, or pain Side effects that usually do not require medical attention (report these to your care team if they continue or are bothersome): Back pain Headache Joint pain Muscle pain Pain in the hands, arms, legs, or feet Runny or stuffy nose Sore throat This list may not describe all possible side effects. Call your doctor for medical advice about side effects. You may report side effects to FDA at 1-800-FDA-1088. Where should I keep my medication? This medication is given in a hospital or clinic. It will not be stored at home. NOTE: This sheet is a summary. It may not cover all possible information. If you have questions about this medicine, talk to your doctor, pharmacist, or health care provider.  2023 Elsevier/Gold Standard (2021-08-21 00:00:00)  

## 2022-06-14 DIAGNOSIS — E785 Hyperlipidemia, unspecified: Secondary | ICD-10-CM | POA: Diagnosis not present

## 2022-06-21 DIAGNOSIS — R079 Chest pain, unspecified: Secondary | ICD-10-CM | POA: Diagnosis not present

## 2022-06-21 DIAGNOSIS — E785 Hyperlipidemia, unspecified: Secondary | ICD-10-CM | POA: Diagnosis not present

## 2022-07-19 ENCOUNTER — Other Ambulatory Visit: Payer: Self-pay | Admitting: *Deleted

## 2022-07-19 ENCOUNTER — Other Ambulatory Visit: Payer: Self-pay

## 2022-07-19 MED ORDER — VENLAFAXINE HCL ER 37.5 MG PO CP24: ORAL_CAPSULE | ORAL | 3 refills | Status: DC

## 2022-07-19 NOTE — Telephone Encounter (Signed)
Patient called in voice mail stating that she wanted Dr. Quincy Simmonds to send new prescription for her Venlafaxine to [place].  Review of chart last visit was 04/09/2020. She had her mammogram 02/13/22 and was neg.  I called patient and she said that Dr. Lindi Adie had been prescribing the Venlafaxine but she wanted to get it back to Dr. Quincy Simmonds since it is for her hotflashes.  I explained we have not see her since 04/09/2020 and she was surprised and very certain she had been in since but chart did not reflect that.  She is willing to schedule a Pelvic/Breast exam visit and I sent message to appt desk to schedule her an appointment.

## 2022-07-19 NOTE — Telephone Encounter (Signed)
Patient informed Rx denied because she needs a visit.  Patient is scheduled for 08/16/22 for complete exam.  She said she does not have enough to last until her appt.

## 2022-07-19 NOTE — Telephone Encounter (Signed)
Per DPR access note on file left message in voice mail advising patient.

## 2022-07-19 NOTE — Telephone Encounter (Signed)
Dr. Lindi Adie or patient's PCP may order a short term prescription until she can return for an office visit here with me.

## 2022-08-02 ENCOUNTER — Encounter: Payer: Self-pay | Admitting: Internal Medicine

## 2022-08-02 ENCOUNTER — Ambulatory Visit: Payer: Medicare PPO | Attending: Internal Medicine | Admitting: Internal Medicine

## 2022-08-02 VITALS — BP 136/76 | HR 72 | Ht 63.0 in | Wt 136.0 lb

## 2022-08-02 DIAGNOSIS — R079 Chest pain, unspecified: Secondary | ICD-10-CM

## 2022-08-02 DIAGNOSIS — Z7982 Long term (current) use of aspirin: Secondary | ICD-10-CM

## 2022-08-02 DIAGNOSIS — E785 Hyperlipidemia, unspecified: Secondary | ICD-10-CM | POA: Insufficient documentation

## 2022-08-02 DIAGNOSIS — E7849 Other hyperlipidemia: Secondary | ICD-10-CM

## 2022-08-02 MED ORDER — NITROGLYCERIN 0.4 MG SL SUBL: 0.4000 mg | SUBLINGUAL_TABLET | SUBLINGUAL | 3 refills | Status: AC | PRN

## 2022-08-02 NOTE — Progress Notes (Signed)
77 y.o. G2P2 Married Caucasian female here for annual exam.    Dr. Pamelia Hoit has been prescribing her Effexor XR, which she started due to menopausal symptoms.  She takes the Effexor at night, and she sleeps better because of this.  Feels occasionally flushed.   Dr. Pamelia Hoit is also prescribing Prolia for her osteoporosis.  PCP:  Dr. Ouida Sills  Patient's last menstrual period was 04/23/1993.           Sexually active:  No. The current method of family planning is post menopausal status/BTL.    Exercising: Yes.     Exercise class 2 x a week, line dancing  Smoker:  former  Health Maintenance: Pap:  04/08/19 neg, 03/06/17 neg History of abnormal Pap:  no MMG:  02/13/22 Breast Density Cat C, BI-RADS CAT 1 neg Colonoscopy:  04/27/21 BMD:   06/01/22  Result  osteoporotic TDaP:  PCP Gardasil:   no HIV: n/a Hep C: n/a Screening Labs:  PCP.   reports that she quit smoking about 48 years ago. Her smoking use included cigarettes. She has a 2.50 pack-year smoking history. She has never used smokeless tobacco. She reports that she does not drink alcohol and does not use drugs.  Past Medical History:  Diagnosis Date   Breast cancer, right breast August 2015   lumpectomy and radiation therapy   Fibroids 12/2008   history of   Osteoporosis    Personal history of radiation therapy    Skin cancer    basal cell    Past Surgical History:  Procedure Laterality Date   APPENDECTOMY     BREAST EXCISIONAL BIOPSY Right    BREAST LUMPECTOMY Right 2015   BREAST SURGERY  2015   right lumpectomy for breast cancer   COLONOSCOPY  10 years ago   dr Karilyn Cota   COLONOSCOPY  12/07/2010   Procedure: COLONOSCOPY;  Surgeon: Malissa Hippo, MD;  [facility];  Service: Endoscopy;  Laterality: N/A;   COLONOSCOPY N/A 04/27/2021   Procedure: COLONOSCOPY;  Surgeon: Malissa Hippo, MD;  Location: [facility];  Service: Endoscopy;  Laterality: N/A;  8:05   TOE ARTHROSCOPY  2005   rt great toe spurs    TONSILLECTOMY     TUBAL LIGATION  1976   VAGINAL DELIVERY     times 2    Current Outpatient Medications  Medication Sig Dispense Refill   CALCIUM PO Take 1 tablet by mouth in the morning and at bedtime.     L-Lysine 1000 MG TABS Take 1,000 mg by mouth daily.     Multiple Vitamin (MULTIVITAMIN) capsule Take 1 capsule by mouth daily.     nitroGLYCERIN (NITROSTAT) 0.4 MG SL tablet Place 1 tablet (0.4 mg total) under the tongue every 5 (five) minutes as needed for chest pain. If a single episode of chest pain is not relieved by one tablet, the patient will try another within 5 minutes; and if this doesn't relieve the pain, the patient is instructed to call 911 for transportation to an emergency department. 25 tablet 3   Omega-3 Fatty Acids (FISH OIL ULTRA) 1400 MG CAPS Take 1,400 mg by mouth daily.     RESTASIS 0.05 % ophthalmic emulsion      rosuvastatin (CRESTOR) 10 MG tablet Take 10 mg by mouth at bedtime.     venlafaxine XR (EFFEXOR-XR) 37.5 MG 24 hr capsule TAKE 1 CAPSULE EVERY DAY 90 capsule 3   No current facility-administered medications for this visit.  Family History  Problem Relation Age of Onset   Diabetes Brother    Heart failure Mother    Aneurysm Mother        abdominal   Cancer Maternal Grandfather     Review of Systems  All other systems reviewed and are negative.   Exam:   BP 124/82 (BP Location: Right Arm, Patient Position: Sitting, Cuff Size: Normal)   Pulse 91   Ht 5' 3.5" (1.613 m)   Wt 134 lb (60.8 kg)   LMP 04/23/1993   SpO2 97%   BMI 23.36 kg/m     General appearance: alert, cooperative and appears stated age Head: normocephalic, without obvious abnormality, atraumatic Neck: no adenopathy, supple, symmetrical, trachea midline and thyroid normal to inspection and palpation Lungs: clear to auscultation bilaterally Breasts: normal appearance, no masses or tenderness, No nipple retraction or dimpling, No nipple discharge or bleeding, No axillary  adenopathy Heart: regular rate and rhythm Abdomen: soft, non-tender; no masses, no organomegaly Extremities: extremities normal, atraumatic, no cyanosis or edema Skin: skin color, texture, turgor normal. No rashes or lesions Lymph nodes: cervical, supraclavicular, and axillary nodes normal. Neurologic: grossly normal  Pelvic: External genitalia:  no lesions              No abnormal inguinal nodes palpated.              Urethra:  normal appearing urethra with no masses, tenderness or lesions              Bartholins and Skenes: normal                 Vagina: normal appearing vagina with normal color and discharge, no lesions              Cervix: no lesions              Pap taken: yes Bimanual Exam:  Uterus:  normal size, contour, position, consistency, mobility, non-tender              Adnexa: no mass, fullness, tenderness              Rectal exam: yes.  Confirms.              Anus:  normal sphincter tone, hemorrhoids noted.  Chaperone was present for exam:  Warren Lacy, CMA  Assessment:   Well woman visit with gynecologic exam. Right breast cancer. Off Anastrozole.   Osteoporosis.  On Prolia. Menopausal symptoms treated with Effexor.     Plan: Mammogram screening discussed. Self breast awareness reviewed. Pap and HR HPV collected.  Guidelines for Calcium, Vitamin D, regular exercise program including cardiovascular and weight bearing exercise. Follow up in 2 years for breast and pelvic exam and follow up prn.  After visit summary provided.

## 2022-08-02 NOTE — Patient Instructions (Signed)
Medication Instructions:  Your physician has recommended you make the following change in your medication:   -Stop Aspirin  - Start SL Nitro 0.4 mg tablets as needed for chest pain. The proper use and anticipated side effects of nitroglycerine has been carefully explained.  If a single episode of chest pain is not relieved by one tablet, the patient will try another within 5 minutes; and if this doesn't relieve the pain, the patient is instructed to call 911 for transportation to an emergency department.   *If you need a refill on your cardiac medications before your next appointment, please call your pharmacy*   Lab Work: None If you have labs (blood work) drawn today and your tests are completely normal, you will receive your results only by: MyChart Message (if you have MyChart) OR A paper copy in the mail If you have any lab test that is abnormal or we need to change your treatment, we will call you to review the results.   Testing/Procedures: Your physician has requested that you have a lexiscan myoview. For further information please visit [email or web address] Please follow instruction sheet, as given.    Follow-Up: At [facility], you and your health needs are our priority.  As part of our continuing mission to provide you with exceptional heart care, we have created designated Provider Care Teams.  These Care Teams include your primary Cardiologist (physician) and Advanced Practice Providers (APPs -  Physician Assistants and Nurse Practitioners) who all work together to provide you with the care you need, when you need it.  We recommend signing up for the patient portal called "MyChart".  Sign up information is provided on this After Visit Summary.  MyChart is used to connect with patients for Virtual Visits (Telemedicine).  Patients are able to view lab/test results, encounter notes, upcoming appointments, etc.  Non-urgent messages can be sent to your provider as well.    To learn more about what you can do with MyChart, go to [email or web address].    Your next appointment:   1 year(s)  Provider:   Luane School, MD    Other Instructions

## 2022-08-02 NOTE — Progress Notes (Signed)
Cardiology Office Note  Date: 08/02/2022   ID: MODIE WESS, DOB 1945/08/07, MRN [id number]  PCP:  [name], MD  Cardiologist:  Marjo Bicker, MD Electrophysiologist:  None   Reason for Office Visit: Evaluation of chest pain at the request of Dr. Ouida Sills   History of Present Illness: Kaitlyn Porter is a 77 y.o. female with no PMH was referred to cardiology clinic for evaluation of chest pain.  Patient had 3 episodes of chest pain in the last 1 year which woke her up from sleep and lasted for a few minutes.  On 2 occasions, chest pain woke her up from sleep but after stretching her upper arms, chest pain completely resolved. On the third occasion, chest pain woke her up from sleep with radiation to the left jaw which lasted for a few minutes.  Her mother had heart blockages and had chest pain radiating to the jaw and hence wanted to get her heart checked out.   Past Medical History:  Diagnosis Date   Breast cancer, right breast August 2015   lumpectomy and radiation therapy   Fibroids 12/2008   history of   Osteoporosis    Personal history of radiation therapy    Skin cancer    basal cell    Past Surgical History:  Procedure Laterality Date   APPENDECTOMY     BREAST EXCISIONAL BIOPSY Right    BREAST LUMPECTOMY Right 2015   BREAST SURGERY  2015   right lumpectomy for breast cancer   COLONOSCOPY  10 years ago   dr Karilyn Cota   COLONOSCOPY  12/07/2010   Procedure: COLONOSCOPY;  Surgeon: Malissa Hippo, MD;  [facility];  Service: Endoscopy;  Laterality: N/A;   COLONOSCOPY N/A 04/27/2021   Procedure: COLONOSCOPY;  Surgeon: Malissa Hippo, MD;  [facility];  Service: Endoscopy;  Laterality: N/A;  8:05   TOE ARTHROSCOPY  2005   rt great toe spurs   TONSILLECTOMY     TUBAL LIGATION  1976   VAGINAL DELIVERY     times 2    Current Outpatient Medications  Medication Sig Dispense Refill   aspirin EC 81 MG tablet Take 81 mg by mouth daily. Swallow  whole.     CALCIUM PO Take 1 tablet by mouth in the morning and at bedtime.     L-Lysine 1000 MG TABS Take 1,000 mg by mouth daily.     Multiple Vitamin (MULTIVITAMIN) capsule Take 1 capsule by mouth daily.     Omega-3 Fatty Acids (FISH OIL ULTRA) 1400 MG CAPS Take 1,400 mg by mouth daily.     RESTASIS 0.05 % ophthalmic emulsion      rosuvastatin (CRESTOR) 10 MG tablet Take 10 mg by mouth at bedtime.     venlafaxine XR (EFFEXOR-XR) 37.5 MG 24 hr capsule TAKE 1 CAPSULE EVERY DAY 90 capsule 3   No current facility-administered medications for this visit.   Allergies:  Latex   Social History: The patient  reports that she quit smoking about 48 years ago. Her smoking use included cigarettes. She has a 2.50 pack-year smoking history. She has never used smokeless tobacco. She reports that she does not drink alcohol and does not use drugs.   Family History: The patient's family history includes Aneurysm in her mother; Cancer in her maternal grandfather; Diabetes in her brother; Heart failure in her mother.   ROS:  Please see the history of present illness. Otherwise, complete review of systems is  positive for none.  All other systems are reviewed and negative.   Physical Exam: VS:  BP 136/76   Pulse 72   Ht 5\' 3"  (1.6 m)   Wt 136 lb (61.7 kg)   LMP 04/23/1993   SpO2 98%   BMI 24.09 kg/m , BMI Body mass index is 24.09 kg/m.  Wt Readings from Last 3 Encounters:  08/02/22 136 lb (61.7 kg)  12/11/21 139 lb (63 kg)  04/27/21 136 lb (61.7 kg)    General: Patient appears comfortable at rest. HEENT: Conjunctiva and lids normal, oropharynx clear with moist mucosa. Neck: Supple, no elevated JVP or carotid bruits, no thyromegaly. Lungs: Clear to auscultation, nonlabored breathing at rest. Cardiac: Regular rate and rhythm, no S3 or significant systolic murmur, no pericardial rub. Abdomen: Soft, nontender, no hepatomegaly, bowel sounds present, no guarding or rebound. Extremities: No pitting  edema, distal pulses 2+. Skin: Warm and dry. Musculoskeletal: No kyphosis. Neuropsychiatric: Alert and oriented x3, affect grossly appropriate.  ECG:  NSR  Recent Labwork: 06/13/2022: ALT 16; AST 17; BUN 17; Creatinine 0.76; Hemoglobin 14.0; Platelet Count 216; Potassium 4.3; Sodium 141  No results found for: "CHOL", "TRIG", "HDL", "CHOLHDL", "VLDL", "LDLCALC", "LDLDIRECT"   Assessment and Plan: Patient is a 77 year old F with no PMH was referred to cardiology clinic for evaluation of chest pain.  # Chest pain: Patient had musculoskeletal chest pain on 2 occasions but on the third occasion, there was 1 episode of chest pain that woke her up from sleep and radiated to the left jaw which lasted for a few minutes.  Will prescribe SL NTG 0.4 mg as needed. will obtain exercise Myoview to rule out any severe CAD, if patient cannot reach target heart rate with treadmill test, switch to Lexiscan.  No indication of echocardiogram at this time. # Primary prevention of CAD: No benefit of taking aspirin 81 mg after 79 years of age for primary prevention of CAD. Discontinue aspirin. # HLD: Continue rosuvastatin 10 mg nightly, HLD per PCP.   I have spent a total of 45 minutes with patient reviewing chart, EKGs, labs and examining patient as well as establishing an assessment and plan that was discussed with the patient.  > 50% of time was spent in direct patient care.     Medication Adjustments/Labs and Tests Ordered: Current medicines are reviewed at length with the patient today.  Concerns regarding medicines are outlined above.   Tests Ordered: Orders Placed This Encounter  Procedures   EKG 12-Lead    Medication Changes: No orders of the defined types were placed in this encounter.   Disposition:  Follow up  1 year  Signed, Ladawn Boullion Verne Spurr, MD, 08/02/2022 8:11 AM     [facility] [address], [place], [place] [address]

## 2022-08-15 ENCOUNTER — Ambulatory Visit (HOSPITAL_COMMUNITY)
Admission: RE | Admit: 2022-08-15 | Discharge: 2022-08-15 | Disposition: A | Discharge status: Home / Self Care | Payer: Medicare PPO | Source: Ambulatory Visit | Attending: Internal Medicine | Admitting: Internal Medicine

## 2022-08-15 ENCOUNTER — Encounter (HOSPITAL_COMMUNITY)
Admission: RE | Admit: 2022-08-15 | Discharge: 2022-08-15 | Disposition: A | Discharge status: Home / Self Care | Payer: Medicare PPO | Source: Ambulatory Visit | Attending: Internal Medicine | Admitting: Internal Medicine

## 2022-08-15 DIAGNOSIS — R079 Chest pain, unspecified: Secondary | ICD-10-CM | POA: Diagnosis not present

## 2022-08-15 LAB — NM MYOCAR MULTI W/SPECT W/WALL MOTION / EF
Angina Index: 0
Base ST Depression (mm): 0 mm
Duke Treadmill Score: 3
Estimated workload: 4.6
Exercise duration (min): 2 min
Exercise duration (sec): 39 s
LV dias vol: 55 mL (ref 46–106)
LV sys vol: 17 mL
MPHR: 142 {beats}/min
Nuc Stress EF: 69 %
Peak HR: 139 {beats}/min
Percent HR: 97 %
RATE: 0.3
RPE: 13
Rest HR: 83 {beats}/min
Rest Nuclear Isotope Dose: 10.4 mCi
SDS: 1
SRS: 2
SSS: 3
ST Depression (mm): 0 mm
Stress Nuclear Isotope Dose: 33 mCi
TID: 1.05

## 2022-08-15 MED ORDER — TECHNETIUM TC 99M TETROFOSMIN IV KIT
30.0000 | PACK | Freq: Once | INTRAVENOUS | Status: AC | PRN
  Administered 2022-08-15: 33 via INTRAVENOUS

## 2022-08-15 MED ORDER — TECHNETIUM TC 99M TETROFOSMIN IV KIT
10.0000 | PACK | Freq: Once | INTRAVENOUS | Status: AC | PRN
  Administered 2022-08-15: 10.4 via INTRAVENOUS

## 2022-08-15 MED ORDER — REGADENOSON 0.4 MG/5ML IV SOLN
INTRAVENOUS | Status: AC
  Filled 2022-08-15: qty 5

## 2022-08-15 MED ORDER — SODIUM CHLORIDE FLUSH 0.9 % IV SOLN
INTRAVENOUS | Status: AC
  Administered 2022-08-15: 10 mL via INTRAVENOUS
  Filled 2022-08-15: qty 10

## 2022-08-16 ENCOUNTER — Encounter: Payer: Self-pay | Admitting: Obstetrics and Gynecology

## 2022-08-16 ENCOUNTER — Other Ambulatory Visit (HOSPITAL_COMMUNITY)
Admission: RE | Admit: 2022-08-16 | Discharge: 2022-08-16 | Disposition: A | Discharge status: Home / Self Care | Payer: Medicare PPO | Source: Ambulatory Visit | Attending: Obstetrics and Gynecology | Admitting: Obstetrics and Gynecology

## 2022-08-16 ENCOUNTER — Ambulatory Visit (INDEPENDENT_AMBULATORY_CARE_PROVIDER_SITE_OTHER): Payer: Medicare PPO | Admitting: Obstetrics and Gynecology

## 2022-08-16 VITALS — BP 124/82 | HR 91 | Ht 63.5 in | Wt 134.0 lb

## 2022-08-16 DIAGNOSIS — Z853 Personal history of malignant neoplasm of breast: Secondary | ICD-10-CM

## 2022-08-16 DIAGNOSIS — Z1151 Encounter for screening for human papillomavirus (HPV): Secondary | ICD-10-CM | POA: Insufficient documentation

## 2022-08-16 DIAGNOSIS — Z124 Encounter for screening for malignant neoplasm of cervix: Secondary | ICD-10-CM | POA: Diagnosis not present

## 2022-08-16 DIAGNOSIS — Z79818 Long term (current) use of other agents affecting estrogen receptors and estrogen levels: Secondary | ICD-10-CM

## 2022-08-16 DIAGNOSIS — Z9189 Other specified personal risk factors, not elsewhere classified: Secondary | ICD-10-CM

## 2022-08-16 DIAGNOSIS — Z01419 Encounter for gynecological examination (general) (routine) without abnormal findings: Secondary | ICD-10-CM

## 2022-08-16 NOTE — Patient Instructions (Signed)

## 2022-08-20 LAB — CYTOLOGY - PAP
Comment: NEGATIVE
Diagnosis: NEGATIVE
High risk HPV: NEGATIVE

## 2022-08-22 DIAGNOSIS — Z79899 Other long term (current) drug therapy: Secondary | ICD-10-CM | POA: Diagnosis not present

## 2022-08-22 DIAGNOSIS — E785 Hyperlipidemia, unspecified: Secondary | ICD-10-CM | POA: Diagnosis not present

## 2022-08-29 DIAGNOSIS — R079 Chest pain, unspecified: Secondary | ICD-10-CM | POA: Diagnosis not present

## 2022-08-29 DIAGNOSIS — E785 Hyperlipidemia, unspecified: Secondary | ICD-10-CM | POA: Diagnosis not present

## 2022-12-11 ENCOUNTER — Other Ambulatory Visit: Payer: Self-pay

## 2022-12-11 DIAGNOSIS — E785 Hyperlipidemia, unspecified: Secondary | ICD-10-CM | POA: Diagnosis not present

## 2022-12-11 DIAGNOSIS — D7589 Other specified diseases of blood and blood-forming organs: Secondary | ICD-10-CM | POA: Diagnosis not present

## 2022-12-11 DIAGNOSIS — G4700 Insomnia, unspecified: Secondary | ICD-10-CM | POA: Diagnosis not present

## 2022-12-11 DIAGNOSIS — C50919 Malignant neoplasm of unspecified site of unspecified female breast: Secondary | ICD-10-CM | POA: Diagnosis not present

## 2022-12-11 DIAGNOSIS — Z170 Estrogen receptor positive status [ER+]: Secondary | ICD-10-CM

## 2022-12-11 DIAGNOSIS — M810 Age-related osteoporosis without current pathological fracture: Secondary | ICD-10-CM | POA: Diagnosis not present

## 2022-12-12 ENCOUNTER — Inpatient Hospital Stay: Payer: Medicare PPO

## 2022-12-12 ENCOUNTER — Inpatient Hospital Stay: Payer: Medicare PPO | Attending: Hematology and Oncology | Admitting: Hematology and Oncology

## 2022-12-12 VITALS — BP 138/81 | HR 77 | Temp 97.5°F | Resp 18 | Ht 63.5 in | Wt 133.6 lb

## 2022-12-12 DIAGNOSIS — Z79810 Long term (current) use of selective estrogen receptor modulators (SERMs): Secondary | ICD-10-CM | POA: Insufficient documentation

## 2022-12-12 DIAGNOSIS — M810 Age-related osteoporosis without current pathological fracture: Secondary | ICD-10-CM | POA: Diagnosis not present

## 2022-12-12 DIAGNOSIS — C50211 Malignant neoplasm of upper-inner quadrant of right female breast: Secondary | ICD-10-CM | POA: Diagnosis not present

## 2022-12-12 DIAGNOSIS — Z79899 Other long term (current) drug therapy: Secondary | ICD-10-CM | POA: Diagnosis not present

## 2022-12-12 DIAGNOSIS — Z170 Estrogen receptor positive status [ER+]: Secondary | ICD-10-CM

## 2022-12-12 DIAGNOSIS — R232 Flushing: Secondary | ICD-10-CM | POA: Diagnosis not present

## 2022-12-12 DIAGNOSIS — Z780 Asymptomatic menopausal state: Secondary | ICD-10-CM

## 2022-12-12 LAB — CBC WITH DIFFERENTIAL (CANCER CENTER ONLY)
Abs Immature Granulocytes: 0.01 10*3/uL (ref 0.00–0.07)
Basophils Absolute: 0.1 10*3/uL (ref 0.0–0.1)
Basophils Relative: 1 %
Eosinophils Absolute: 0.1 10*3/uL (ref 0.0–0.5)
Eosinophils Relative: 1 %
HCT: 39 % (ref 36.0–46.0)
Hemoglobin: 13.1 g/dL (ref 12.0–15.0)
Immature Granulocytes: 0 %
Lymphocytes Relative: 32 %
Lymphs Abs: 1.8 10*3/uL (ref 0.7–4.0)
MCH: 34.7 pg — ABNORMAL HIGH (ref 26.0–34.0)
MCHC: 33.6 g/dL (ref 30.0–36.0)
MCV: 103.2 fL — ABNORMAL HIGH (ref 80.0–100.0)
Monocytes Absolute: 0.4 10*3/uL (ref 0.1–1.0)
Monocytes Relative: 6 %
Neutro Abs: 3.3 10*3/uL (ref 1.7–7.7)
Neutrophils Relative %: 60 %
Platelet Count: 229 10*3/uL (ref 150–400)
RBC: 3.78 MIL/uL — ABNORMAL LOW (ref 3.87–5.11)
RDW: 11.9 % (ref 11.5–15.5)
WBC Count: 5.7 10*3/uL (ref 4.0–10.5)
nRBC: 0 % (ref 0.0–0.2)

## 2022-12-12 LAB — CMP (CANCER CENTER ONLY)
ALT: 17 U/L (ref 0–44)
AST: 16 U/L (ref 15–41)
Albumin: 4.2 g/dL (ref 3.5–5.0)
Alkaline Phosphatase: 43 U/L (ref 38–126)
Anion gap: 3 — ABNORMAL LOW (ref 5–15)
BUN: 20 mg/dL (ref 8–23)
CO2: 32 mmol/L (ref 22–32)
Calcium: 9.9 mg/dL (ref 8.9–10.3)
Chloride: 107 mmol/L (ref 98–111)
Creatinine: 0.78 mg/dL (ref 0.44–1.00)
GFR, Estimated: >60 mL/min (ref >60)
Glucose, Bld: 111 mg/dL — ABNORMAL HIGH (ref 70–99)
Potassium: 5.5 mmol/L — ABNORMAL HIGH (ref 3.5–5.1)
Sodium: 142 mmol/L (ref 135–145)
Total Bilirubin: 0.4 mg/dL (ref 0.3–1.2)
Total Protein: 7.1 g/dL (ref 6.5–8.1)

## 2022-12-12 MED ORDER — DENOSUMAB 60 MG/ML ~~LOC~~ SOSY
60.0000 mg | PREFILLED_SYRINGE | Freq: Once | SUBCUTANEOUS | Status: AC
  Administered 2022-12-12: 60 mg via SUBCUTANEOUS
  Filled 2022-12-12: qty 1

## 2022-12-12 NOTE — Assessment & Plan Note (Addendum)
100% HER-2 negative Ki-67 14% grade 1, Oncotype DX recurrence score 13 (ROR 10%)   Treatment summary: Tamoxifen 20 mg daily started 01/22/2014 changed to anastrozole October 2016 completed 2022    Occasional hot flashes well controlled with Effexor   Osteoporosis:  Bone density 01/23/2018: T score -2.28 January 2020. T score -2.9 06/01/2022: T-score -2.8 Currently on Prolia with calcium and vitamin D    Surveillance: 1.  Breast exam 12/12/2022: No palpable lumps or nodules of concern 2. mammogram: 08/15/2022: Benign breast density category C   Return to clinic in every 6 months for Prolia and once a year for follow-up with [place]

## 2022-12-12 NOTE — Progress Notes (Signed)
Patient Care Team: [name], MD as PCP - General (Internal Medicine) Mallipeddi, Orion Modest, MD as PCP - Cardiology (Cardiology)  DIAGNOSIS:  Encounter Diagnosis  Name Primary?   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 with above-mentioned history of right breast cancer treated with lumpectomy, radiation, and who is currently on surveillance. Patient denies any side effects or symptoms. She has no new concerns to report to the clinic. [facility]  ALLERGIES:  is allergic to latex.  MEDICATIONS:  Current Outpatient Medications  Medication Sig Dispense Refill   CALCIUM PO Take 1 tablet by mouth in the morning and at bedtime.     L-Lysine 1000 MG TABS Take 1,000 mg by mouth daily.     Multiple Vitamin (MULTIVITAMIN) capsule Take 1 capsule by mouth daily.     Omega-3 Fatty Acids (FISH OIL ULTRA) 1400 MG CAPS Take 1,400 mg by mouth daily.     RESTASIS 0.05 % ophthalmic emulsion      rosuvastatin (CRESTOR) 10 MG tablet Take 10  mg by mouth at bedtime.     venlafaxine XR (EFFEXOR-XR) 37.5 MG 24 hr capsule TAKE 1 CAPSULE EVERY DAY 90 capsule 3   nitroGLYCERIN (NITROSTAT) 0.4 MG SL tablet Place 1 tablet (0.4 mg total) under the tongue every 5 (five) minutes as needed for chest pain. If a single episode of chest pain is not relieved by one tablet, the patient will try another within 5 minutes; and if this doesn't relieve the pain, the patient is instructed to call 911 for transportation to an emergency department. 25 tablet 3   No current facility-administered medications for this visit.    PHYSICAL EXAMINATION: ECOG PERFORMANCE STATUS: 1 - Symptomatic but completely ambulatory  Vitals:   12/12/22 1115  BP: 138/81  Pulse: 77  Resp: 18  Temp: (!) 97.5 F (36.4 C)  SpO2: 100%   Filed Weights   12/12/22 1115  Weight: 133 lb 9.6 oz (60.6 kg)    BREAST: No palpable masses or nodules in either right or left breasts. No palpable axillary supraclavicular or infraclavicular adenopathy no breast tenderness or nipple discharge. (exam performed in the presence of a chaperone)  LABORATORY DATA:  I have reviewed the data as listed    Latest Ref Rng & Units 12/12/2022   10:56 AM 06/13/2022   10:20 AM 12/11/2021   11:35 AM  CMP  Glucose 70 - 99 mg/dL 604  96  95   BUN 8 - 23 mg/dL 20  17  17   Creatinine 0.44 - 1.00 mg/dL 6.21  3.08  6.57   Sodium 135 - 145 mmol/L 142  141  137   Potassium 3.5 - 5.1 mmol/L 5.5  4.3  4.6   Chloride 98 - 111 mmol/L 107  106  106   CO2 22 - 32 mmol/L 32  30  29   Calcium 8.9 - 10.3 mg/dL 9.9  9.7  9.6   Total Protein 6.5 - 8.1 g/dL 7.1  7.1  6.8   Total Bilirubin 0.3 - 1.2 mg/dL 0.4  0.3  0.4   Alkaline Phos 38 - 126 U/L 43  46  48   AST 15 - 41 U/L 16  17  13    ALT 0 - 44 U/L 17  16  15      Lab Results  Component Value Date   WBC 5.7 12/12/2022   HGB 13.1 12/12/2022   HCT 39.0 12/12/2022   MCV 103.2 (H) 12/12/2022   PLT 229 12/12/2022   NEUTROABS 3.3 12/12/2022     ASSESSMENT & PLAN:  Breast cancer of upper-inner quadrant of right female breast 100% HER-2 negative Ki-67 14% grade 1, Oncotype DX recurrence score 13 (ROR 10%)   Treatment summary: Tamoxifen 20 mg daily started 01/22/2014 changed to anastrozole October 2016 completed 2022    Occasional hot flashes well controlled with Effexor   Osteoporosis:  Bone density 01/23/2018: T score -2.28 January 2020. T score -2.9 06/01/2022: T-score -2.8 Currently on Prolia with calcium and vitamin D    Surveillance: 1.  Breast exam 12/12/2022: No palpable lumps or nodules of concern 2. mammogram: 08/15/2022: Benign breast density category C   Return to clinic in every 6 months for Prolia and once a year for follow-up with [place]    No orders of the defined types were placed in this encounter.  The patient has a good understanding of the overall plan. she agrees with it. she will call with any problems that may develop before the next visit here. Total time spent: 30 mins including face to face time and time spent for planning, charting and co-ordination of care   Tamsen Meek, MD 12/12/22    I Janan Ridge am acting as a Neurosurgeon for [employer]  I have reviewed the above documentation for accuracy and completeness, and I agree with the above.

## 2022-12-18 DIAGNOSIS — E785 Hyperlipidemia, unspecified: Secondary | ICD-10-CM | POA: Diagnosis not present

## 2022-12-18 DIAGNOSIS — M810 Age-related osteoporosis without current pathological fracture: Secondary | ICD-10-CM | POA: Diagnosis not present

## 2022-12-18 DIAGNOSIS — N950 Postmenopausal bleeding: Secondary | ICD-10-CM | POA: Diagnosis not present

## 2022-12-18 DIAGNOSIS — R079 Chest pain, unspecified: Secondary | ICD-10-CM | POA: Diagnosis not present

## 2022-12-18 DIAGNOSIS — I491 Atrial premature depolarization: Secondary | ICD-10-CM | POA: Diagnosis not present

## 2022-12-18 DIAGNOSIS — Z0001 Encounter for general adult medical examination with abnormal findings: Secondary | ICD-10-CM | POA: Diagnosis not present

## 2022-12-18 DIAGNOSIS — Z853 Personal history of malignant neoplasm of breast: Secondary | ICD-10-CM | POA: Diagnosis not present

## 2023-01-14 ENCOUNTER — Other Ambulatory Visit: Payer: Self-pay | Admitting: Obstetrics and Gynecology

## 2023-01-14 DIAGNOSIS — Z1231 Encounter for screening mammogram for malignant neoplasm of breast: Secondary | ICD-10-CM

## 2023-02-11 DIAGNOSIS — Z23 Encounter for immunization: Secondary | ICD-10-CM | POA: Diagnosis not present

## 2023-02-20 ENCOUNTER — Ambulatory Visit: Payer: Medicare PPO

## 2023-02-20 DIAGNOSIS — L720 Epidermal cyst: Secondary | ICD-10-CM | POA: Diagnosis not present

## 2023-02-20 DIAGNOSIS — L570 Actinic keratosis: Secondary | ICD-10-CM | POA: Diagnosis not present

## 2023-02-20 DIAGNOSIS — D2272 Melanocytic nevi of left lower limb, including hip: Secondary | ICD-10-CM | POA: Diagnosis not present

## 2023-02-20 DIAGNOSIS — Z85828 Personal history of other malignant neoplasm of skin: Secondary | ICD-10-CM | POA: Diagnosis not present

## 2023-02-20 DIAGNOSIS — D2271 Melanocytic nevi of right lower limb, including hip: Secondary | ICD-10-CM | POA: Diagnosis not present

## 2023-02-20 DIAGNOSIS — L814 Other melanin hyperpigmentation: Secondary | ICD-10-CM | POA: Diagnosis not present

## 2023-02-20 DIAGNOSIS — D225 Melanocytic nevi of trunk: Secondary | ICD-10-CM | POA: Diagnosis not present

## 2023-02-20 DIAGNOSIS — L905 Scar conditions and fibrosis of skin: Secondary | ICD-10-CM | POA: Diagnosis not present

## 2023-02-20 DIAGNOSIS — L821 Other seborrheic keratosis: Secondary | ICD-10-CM | POA: Diagnosis not present

## 2023-03-12 ENCOUNTER — Ambulatory Visit
Admission: RE | Admit: 2023-03-12 | Discharge: 2023-03-12 | Disposition: A | Discharge status: Home / Self Care | Payer: Medicare PPO | Source: Ambulatory Visit | Attending: Obstetrics and Gynecology | Admitting: Obstetrics and Gynecology

## 2023-03-12 DIAGNOSIS — Z1231 Encounter for screening mammogram for malignant neoplasm of breast: Secondary | ICD-10-CM | POA: Diagnosis not present

## 2023-06-04 DIAGNOSIS — H04123 Dry eye syndrome of bilateral lacrimal glands: Secondary | ICD-10-CM | POA: Diagnosis not present

## 2023-06-13 ENCOUNTER — Other Ambulatory Visit: Payer: Self-pay | Admitting: *Deleted

## 2023-06-13 DIAGNOSIS — C50211 Malignant neoplasm of upper-inner quadrant of right female breast: Secondary | ICD-10-CM

## 2023-06-14 ENCOUNTER — Inpatient Hospital Stay: Payer: Medicare PPO

## 2023-06-14 ENCOUNTER — Inpatient Hospital Stay: Payer: Medicare PPO | Attending: Hematology and Oncology

## 2023-06-14 ENCOUNTER — Encounter: Payer: Self-pay | Admitting: Hematology and Oncology

## 2023-06-14 DIAGNOSIS — Z853 Personal history of malignant neoplasm of breast: Secondary | ICD-10-CM | POA: Insufficient documentation

## 2023-06-14 DIAGNOSIS — Z170 Estrogen receptor positive status [ER+]: Secondary | ICD-10-CM

## 2023-06-14 DIAGNOSIS — Z79899 Other long term (current) drug therapy: Secondary | ICD-10-CM | POA: Insufficient documentation

## 2023-06-14 DIAGNOSIS — M810 Age-related osteoporosis without current pathological fracture: Secondary | ICD-10-CM | POA: Insufficient documentation

## 2023-06-14 LAB — CMP (CANCER CENTER ONLY)
ALT: 18 U/L (ref 0–44)
AST: 14 U/L — ABNORMAL LOW (ref 15–41)
Albumin: 4.1 g/dL (ref 3.5–5.0)
Alkaline Phosphatase: 43 U/L (ref 38–126)
Anion gap: 3 — ABNORMAL LOW (ref 5–15)
BUN: 19 mg/dL (ref 8–23)
CO2: 31 mmol/L (ref 22–32)
Calcium: 9.5 mg/dL (ref 8.9–10.3)
Chloride: 106 mmol/L (ref 98–111)
Creatinine: 0.8 mg/dL (ref 0.44–1.00)
GFR, Estimated: >60 mL/min (ref >60)
Glucose, Bld: 67 mg/dL — ABNORMAL LOW (ref 70–99)
Potassium: 4.8 mmol/L (ref 3.5–5.1)
Sodium: 140 mmol/L (ref 135–145)
Total Bilirubin: 0.4 mg/dL (ref 0.0–1.2)
Total Protein: 6.6 g/dL (ref 6.5–8.1)

## 2023-06-14 LAB — CBC WITH DIFFERENTIAL (CANCER CENTER ONLY)
Abs Immature Granulocytes: 0 10*3/uL (ref 0.00–0.07)
Basophils Absolute: 0 10*3/uL (ref 0.0–0.1)
Basophils Relative: 1 %
Eosinophils Absolute: 0.1 10*3/uL (ref 0.0–0.5)
Eosinophils Relative: 2 %
HCT: 39.8 % (ref 36.0–46.0)
Hemoglobin: 13.4 g/dL (ref 12.0–15.0)
Immature Granulocytes: 0 %
Lymphocytes Relative: 34 %
Lymphs Abs: 1.8 10*3/uL (ref 0.7–4.0)
MCH: 35.1 pg — ABNORMAL HIGH (ref 26.0–34.0)
MCHC: 33.7 g/dL (ref 30.0–36.0)
MCV: 104.2 fL — ABNORMAL HIGH (ref 80.0–100.0)
Monocytes Absolute: 0.4 10*3/uL (ref 0.1–1.0)
Monocytes Relative: 7 %
Neutro Abs: 3 10*3/uL (ref 1.7–7.7)
Neutrophils Relative %: 56 %
Platelet Count: 221 10*3/uL (ref 150–400)
RBC: 3.82 MIL/uL — ABNORMAL LOW (ref 3.87–5.11)
RDW: 11.7 % (ref 11.5–15.5)
WBC Count: 5.3 10*3/uL (ref 4.0–10.5)
nRBC: 0 % (ref 0.0–0.2)

## 2023-06-14 MED ORDER — DENOSUMAB 60 MG/ML ~~LOC~~ SOSY
60.0000 mg | PREFILLED_SYRINGE | Freq: Once | SUBCUTANEOUS | Status: AC
  Administered 2023-06-14: 60 mg via SUBCUTANEOUS
  Filled 2023-06-14: qty 1

## 2023-07-15 DIAGNOSIS — C50911 Malignant neoplasm of unspecified site of right female breast: Secondary | ICD-10-CM | POA: Diagnosis not present

## 2023-07-17 DIAGNOSIS — C50911 Malignant neoplasm of unspecified site of right female breast: Secondary | ICD-10-CM | POA: Diagnosis not present

## 2023-07-24 ENCOUNTER — Other Ambulatory Visit: Payer: Self-pay | Admitting: Hematology and Oncology

## 2023-08-01 DIAGNOSIS — H25813 Combined forms of age-related cataract, bilateral: Secondary | ICD-10-CM | POA: Diagnosis not present

## 2023-08-01 DIAGNOSIS — H01001 Unspecified blepharitis right upper eyelid: Secondary | ICD-10-CM | POA: Diagnosis not present

## 2023-08-01 DIAGNOSIS — H40022 Open angle with borderline findings, high risk, left eye: Secondary | ICD-10-CM | POA: Diagnosis not present

## 2023-08-01 DIAGNOSIS — H02831 Dermatochalasis of right upper eyelid: Secondary | ICD-10-CM | POA: Diagnosis not present

## 2023-08-01 DIAGNOSIS — H02834 Dermatochalasis of left upper eyelid: Secondary | ICD-10-CM | POA: Diagnosis not present

## 2023-09-12 DIAGNOSIS — H25811 Combined forms of age-related cataract, right eye: Secondary | ICD-10-CM | POA: Diagnosis not present

## 2023-09-13 ENCOUNTER — Encounter (HOSPITAL_COMMUNITY): Payer: Self-pay

## 2023-09-13 ENCOUNTER — Encounter (HOSPITAL_COMMUNITY)
Admission: RE | Admit: 2023-09-13 | Discharge: 2023-09-13 | Disposition: A | Discharge status: Home / Self Care | Source: Ambulatory Visit | Attending: Ophthalmology | Admitting: Ophthalmology

## 2023-09-13 NOTE — Patient Instructions (Signed)
 Kaitlyn Porter  09/13/2023     @PREFPERIOPPHARMACY @   Your procedure is scheduled on 09/20/2023.   Report to [facility] at 7:00 A.M.   Call this number if you have problems the morning of surgery:   [phone]  If you experience any cold or flu symptoms such as cough, fever, chills, shortness of breath, etc. between now and your scheduled surgery, please notify us  at the above number.   Remember:   Do not eat or drink after midnight.      Take these medicines the morning of surgery with A SIP OF WATER  : Effexor     Do not wear jewelry, make-up or nail polish, including gel polish,  artificial nails, or any other type of covering on natural nails (fingers and  toes).  Do not wear lotions, powders, or perfumes, or deodorant.  Do not shave 48 hours prior to surgery.  Men may shave face and neck.  Do not bring valuables to the hospital.  [facility] is not responsible for any belongings or valuables.  Contacts, dentures or bridgework may not be worn into surgery.  Leave your suitcase in the car.  After surgery it may be brought to your room.  For patients admitted to the hospital, discharge time will be determined by your treatment team.  Patients discharged the day of surgery will not be allowed to drive home.   Name and phone number of your driver:   family  Special instructions:  n/A  Please read over the following fact sheets that you were given.  Care and Recovery After Surgery  Cataract Surgery Cataract surgery is a procedure to remove a cloudy lens (cataract) in the eye. The lens focuses light inside the eye. When the lens becomes cloudy, vision is affected. An artificial lens implant (intraocular lens or IOL) is usually inserted to replace the cloudy lens and to properly focus light in the eye. Cataract surgery is usually recommended when the cataract is causing problems with daily functioning, such as reading, watching television, or driving. Tell a health care  provider about: Any allergies you have. All medicines you are taking, including vitamins, herbs, eye drops, creams, and over-the-counter medicines. Any problems you or family members have had with anesthetic medicines. Any bleeding problems you have. Any surgeries you have had, especially eye surgeries that include refractive surgery, such as PRK and LASIK. Any medical conditions you have. Whether you are pregnant or may be pregnant. What are the risks? Generally, this is a safe procedure. However, problems may occur, including: Infection. Bleeding. High pressure in the eye. Detachment of the retina. Swelling of the cornea or inflammation of the eye. Drooping of the eyelid. Allergic reactions to medicines. Other problems that may happen after cataract surgery include: Clouding of the part of your eye that holds an IOL in place (after-cataract). This is common and can be treated at a later date with laser surgery. An IOL moving out of position. This is rare. Loss of vision. This is rare. What happens before the procedure? Pre-procedure visits Your health care provider will take measurements of the curvatures of your corneas and the lengths of your eyes to determine the strength of the IOL. You and your health care provider will discuss which type of IOL might work best for you and your lifestyle. IOLs are made of plastic, acrylic, or silicone. When to stop eating and drinking Follow instructions from your health care provider about what you may eat and drink before  your procedure. These may include: 8 hours before your procedure Stop eating most foods. Do not eat meat, fried foods, or fatty foods. Eat only light foods, such as toast or crackers. All liquids are okay except energy drinks and alcohol. 6 hours before your procedure Stop eating. Drink only clear liquids, such as water , clear fruit juice, black coffee, plain tea, and sports drinks. Do not drink energy drinks or  alcohol. 2 hours before your procedure Stop drinking all liquids. You may be allowed to take medicines with small sips of water . If you do not follow your health care provider's instructions, your procedure may be delayed or canceled. Medicines Ask your health care provider about: Changing or stopping your regular medicines. This is especially important if you are taking diabetes medicines or blood thinners. Taking medicines such as aspirin and ibuprofen. These medicines can thin your blood. Do not take these medicines unless your health care provider tells you to take them. Taking over-the-counter medicines, vitamins, herbs, and supplements. General instructions Do not put contact lenses in either eye on the day of your surgery. If you will be going home right after the procedure, plan to have a responsible adult: Take you home from the hospital or clinic. You will not be allowed to drive. Care for you for the time you are told. Ask your health care provider what steps will be taken to help prevent infection. These steps may include: Washing skin with a germ-killing soap. Taking antibiotic medicine. What happens during the procedure?     An IV may be inserted into one of your veins. Your surgeon will place eye drops in your eye to dilate the pupil. You will be given one or both of the following: A medicine to help you relax (sedative). A medicine to numb the area (local anesthetic). This may be numbing eye drops or an injection that is given behind the eye. A small incision will be made to the edge of the clear surface that covers the front of the eye (cornea). Occasionally, a laser may be used to perform parts of the surgery (phacoemulsification). A tiny probe will be inserted into the eye. This device gives off ultrasound waves that soften and break up the cloudy center of the lens. This makes it easier for the cataract to be removed. The cataract will be removed by suction. An IOL  is usually implanted. Part of the capsule that surrounds the lens will be left in the eye to support the IOL. Your surgeon may use stitches (sutures) to close the incision in your cornea. The procedure may vary among health care providers and hospitals. What happens after the procedure? Your blood pressure, heart rate, breathing rate, and blood oxygen level will be monitored until you leave the hospital or clinic. You may be given a protective shield or patch to wear over your eye. You may be given medicines to relieve discomfort and swelling and to prevent infection. Some medicines may be in the form of eye drops. If you were given a sedative during the procedure, it can affect you for several hours. Do not drive or operate machinery until your health care provider says that it is safe. Summary Cataract surgery is a procedure to remove a cloudy lens (cataract) in the eye. This is a safe procedure. However, problems may occur, including infection, bleeding, inflammation, corneal swelling, and loss of vision. Follow your health care provider's instructions about when to stop eating and drinking and whether to change or stop  certain medicines before your surgery. After the procedure, you may be given medicines. You may also be given an eye shield or patch to wear over your eye. This information is not intended to replace advice given to you by your health care provider. Make sure you discuss any questions you have with your health care provider. Document Revised: 11/09/2020 Document Reviewed: 11/09/2020 Elsevier Patient Education  2024 [employer]  Monitored Anesthesia Care Anesthesia refers to the techniques, procedures, and medicines that help a person stay safe and comfortable during surgery. Monitored anesthesia care, or sedation, is one type of anesthesia. You may have sedation if you do not need to be asleep for your procedure. Procedures that use sedation may include: Surgery to remove  cataracts from your eyes. A dental procedure. A biopsy. This is when a tissue sample is removed and looked at under a microscope. You will be watched closely during your procedure. Your level of sedation or type of anesthesia may be changed to fit your needs. Tell a health care provider about: Any allergies you have. All medicines you are taking, including vitamins, herbs, eye drops, creams, and over-the-counter medicines. Any problems you or family members have had with anesthesia. Any bleeding problems you have. Any surgeries you have had. Any medical conditions or illnesses you have. This includes sleep apnea, cough, fever, or the flu. Whether you are pregnant or may be pregnant. Whether you use cigarettes, alcohol, or drugs. Any use of steroids, whether by mouth or as a cream. What are the risks? Your health care provider will talk with you about risks. These may include: Getting too much medicine (oversedation). Nausea. Allergic reactions to medicines. Trouble breathing. If this happens, a breathing tube may be used to help you breathe. It will be removed when you are awake and breathing on your own. Heart trouble. Lung trouble. Confusion that gets better with time (emergence delirium). What happens before the procedure? When to stop eating and drinking Follow instructions from your health care provider about what you may eat and drink. These may include: 8 hours before your procedure Stop eating most foods. Do not eat meat, fried foods, or fatty foods. Eat only light foods, such as toast or crackers. All liquids are okay except energy drinks and alcohol. 6 hours before your procedure Stop eating. Drink only clear liquids, such as water , clear fruit juice, black coffee, plain tea, and sports drinks. Do not drink energy drinks or alcohol. 2 hours before your procedure Stop drinking all liquids. You may be allowed to take medicines with small sips of water . If you do not  follow your health care provider's instructions, your procedure may be delayed or canceled. Medicines Ask your health care provider about: Changing or stopping your regular medicines. These include any diabetes medicines or blood thinners you take. Taking medicines such as aspirin and ibuprofen. These medicines can thin your blood. Do not take them unless your health care provider tells you to. Taking over-the-counter medicines, vitamins, herbs, and supplements. Testing You may have an exam or testing. You may have a blood or urine sample taken. General instructions Do not use any products that contain nicotine or tobacco for at least 4 weeks before the procedure. These products include cigarettes, chewing tobacco, and vaping devices, such as e-cigarettes. If you need help quitting, ask your health care provider. If you will be going home right after the procedure, plan to have a responsible adult: Take you home from the hospital or clinic. You will  not be allowed to drive. Care for you for the time you are told. What happens during the procedure?  Your blood pressure, heart rate, breathing, level of pain, and blood oxygen level will be monitored. An IV will be inserted into one of your veins. You may be given: A sedative. This helps you relax. Anesthesia. This will: Numb certain areas of your body. Make you fall asleep for surgery. You will be given medicines as needed to keep you comfortable. The more medicine you are given, the deeper your level of sedation will be. Your level of sedation may be changed to fit your needs. There are three levels of sedation: Mild sedation. At this level, you may feel awake and relaxed. You will be able to follow directions. Moderate sedation. At this level, you will be sleepy. You may not remember the procedure. Deep sedation. At this level, you will be asleep. You will not remember the procedure. How you get the medicines will depend on your age and the  procedure. They may be given as: A pill. This may be taken by mouth (orally) or inserted into the rectum. An injection. This may be into a vein or muscle. A spray through the nose. After your procedure is over, the medicine will be stopped. The procedure may vary among health care providers and hospitals. What happens after the procedure? Your blood pressure, heart rate, breathing rate, and blood oxygen level will be monitored until you leave the hospital or clinic. You may feel sleepy, clumsy, or nauseous. You may not remember what happened during or after the procedure. Sedation can affect you for several hours. Do not drive or use machinery until your health care provider says that it is safe. This information is not intended to replace advice given to you by your health care provider. Make sure you discuss any questions you have with your health care provider. Document Revised: 09/04/2021 Document Reviewed: 09/04/2021 Elsevier Patient Education  2024 [employer].

## 2023-09-18 NOTE — H&P (Signed)
 Surgical History & Physical  Patient Name: Kaitlyn Porter  DOB: 11-Nov-1945  Surgery: Cataract extraction with intraocular lens implant phacoemulsification; Right Eye Surgeon: Tarri Farm MD Surgery Date: 09/20/2023 Pre-Op Date: 08/01/2023  HPI: A 55 Yr. old female patient 1. The patient is here for a cataract eval. Ref: Dr. Barbra Boone. Pt. complains of difficulty when viewing TV, reading closed caption, and reading. The right eye is affected. The episode is constant. Symptoms occur when the patient is driving. The complaint is associated with blurry vision. This is negatively affecting the patients quality of life and the patient is unable to function adequately in life with the current level of vision. HPI Completed by Dr. Tarri Farm  Medical History: Dry Eyes Cataracts  Cholesterol  Review of Systems Endocrine Cholesterol  Social Never smoked  Medication Rosuvastatin, Cyclosporine, Venlafaxine   Sx/Procedures Structure of eye, Lasik,   Drug Allergies  Latex  Heent: NECK: supple without bruits LUNGS: lungs clear to auscultation CV: regular rate and rhythm Abdomen: soft and non-tender  Impression & Plan: Assessment: 1.  COMBINED FORMS AGE RELATED CATARACT; Both Eyes (H25.813) 2.  DERMATOCHALASIS, no surgery; Right Upper Lid, Left Upper Lid (H02.831, H02.834) 3.  BLEPHARITIS; Right Upper Lid, Right Lower Lid, Left Upper Lid, Left Lower Lid (H01.001, H01.002,H01.004,H01.005) 4.  CONJUNCTIVOCHALASIS; Both Eyes (H11.823) 5.  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  OAG BORDERLINE FINDINGS HIGH RISK; Left Eye (H40.022) 7.  ASTIGMATISM, REGULAR; Both Eyes (H52.223)  Plan: 1.  Cataract accounts for the patient's decreased vision. This visual impairment is not correctable with a tolerable change in glasses or contact lenses. Cataract surgery with an implantation of a new lens should significantly improve the visual and functional status of the patient. Discussed all risks,  benefits, alternatives, and potential complications. Discussed the procedures and recovery. Patient desires to have surgery. A-scan ordered and performed today for intra-ocular lens calculations. The surgery will be performed in order to improve vision for driving, reading, and for eye examinations. Recommend phacoemulsification with intra-ocular lens. Recommend Dextenza  for post-operative pain and inflammation. Right Eye worse - first. Dilates well - shugarcaine by protocol. Recommend Toric Lens.  2.  Likely visually significant.  3.  Blepharitis is present - recommend regular lid cleaning.  4.  Discussed condition and symptoms and is considered a type of Dry Eye Disease called "mechanical dry eye" caused by excessive tissue on the eye that is rubbed by the eyelids.  5.  Old Asymptomatic. RD precautions given. Patient to call with increase in flashing lights/floaters/dark curtain. Symptomatic.  6.  Based on cup-to-disc ratio. IOP's WNL OU Positive family history. OCT rNFL today shows: thin OD, Borderline OS 4/25 Detailed discussion about glaucoma today including importance of maintaining good follow up and following treatment plan, and the possibility of irreversible blindness as part of this disease process.  7.  Recommend toric IOL OU.

## 2023-09-20 ENCOUNTER — Ambulatory Visit (HOSPITAL_COMMUNITY): Admitting: Certified Registered"

## 2023-09-20 ENCOUNTER — Encounter (HOSPITAL_COMMUNITY)
Admission: RE | Disposition: A | Discharge status: Home / Self Care | Payer: Self-pay | Source: Home / Self Care | Attending: Ophthalmology

## 2023-09-20 ENCOUNTER — Ambulatory Visit (HOSPITAL_COMMUNITY)
Admission: RE | Admit: 2023-09-20 | Discharge: 2023-09-20 | Disposition: A | Discharge status: Home / Self Care | Attending: Ophthalmology | Admitting: Ophthalmology

## 2023-09-20 ENCOUNTER — Other Ambulatory Visit: Payer: Self-pay

## 2023-09-20 DIAGNOSIS — Z87891 Personal history of nicotine dependence: Secondary | ICD-10-CM | POA: Insufficient documentation

## 2023-09-20 DIAGNOSIS — H01004 Unspecified blepharitis left upper eyelid: Secondary | ICD-10-CM | POA: Insufficient documentation

## 2023-09-20 DIAGNOSIS — H01001 Unspecified blepharitis right upper eyelid: Secondary | ICD-10-CM | POA: Diagnosis not present

## 2023-09-20 DIAGNOSIS — H11823 Conjunctivochalasis, bilateral: Secondary | ICD-10-CM | POA: Diagnosis not present

## 2023-09-20 DIAGNOSIS — H5711 Ocular pain, right eye: Secondary | ICD-10-CM | POA: Diagnosis not present

## 2023-09-20 DIAGNOSIS — H25811 Combined forms of age-related cataract, right eye: Secondary | ICD-10-CM

## 2023-09-20 DIAGNOSIS — H43812 Vitreous degeneration, left eye: Secondary | ICD-10-CM | POA: Diagnosis not present

## 2023-09-20 DIAGNOSIS — H52223 Regular astigmatism, bilateral: Secondary | ICD-10-CM | POA: Insufficient documentation

## 2023-09-20 HISTORY — PX: CATARACT EXTRACTION W/PHACO: SHX586

## 2023-09-20 HISTORY — PX: INSERTION, STENT, DRUG-ELUTING, LACRIMAL CANALICULUS: SHX7453

## 2023-09-20 SURGERY — PHACOEMULSIFICATION, CATARACT, WITH IOL INSERTION
Anesthesia: General | Site: Eye | Laterality: Right

## 2023-09-20 MED ORDER — MIDAZOLAM HCL 2 MG/2ML IJ SOLN
INTRAMUSCULAR | Status: AC
  Filled 2023-09-20: qty 2

## 2023-09-20 MED ORDER — LIDOCAINE HCL (PF) 1 % IJ SOLN
INTRAOCULAR | Status: DC | PRN
  Administered 2023-09-20: 1 mL via OPHTHALMIC

## 2023-09-20 MED ORDER — DEXAMETHASONE 0.4 MG OP INST
VAGINAL_INSERT | OPHTHALMIC | Status: DC | PRN
Start: 2023-09-20 — End: 2023-09-20
  Administered 2023-09-20: .4 mg via OPHTHALMIC

## 2023-09-20 MED ORDER — SODIUM HYALURONATE 10 MG/ML IO SOLUTION
PREFILLED_SYRINGE | INTRAOCULAR | Status: DC | PRN
Start: 2023-09-20 — End: 2023-09-20
  Administered 2023-09-20: .85 mL via INTRAOCULAR

## 2023-09-20 MED ORDER — EPINEPHRINE PF 1 MG/ML IJ SOLN
INTRAMUSCULAR | Status: DC | PRN
  Administered 2023-09-20: 500 mL

## 2023-09-20 MED ORDER — STERILE WATER FOR IRRIGATION IR SOLN
Status: DC | PRN
  Administered 2023-09-20: 200 mL

## 2023-09-20 MED ORDER — PHENYLEPHRINE HCL 2.5 % OP SOLN
1.0000 [drp] | OPHTHALMIC | Status: AC | PRN
  Administered 2023-09-20 (×3): 1 [drp] via OPHTHALMIC

## 2023-09-20 MED ORDER — LIDOCAINE HCL 3.5 % OP GEL: 1.0000 | Freq: Once | OPHTHALMIC | Status: DC

## 2023-09-20 MED ORDER — BSS IO SOLN
INTRAOCULAR | Status: DC | PRN
  Administered 2023-09-20: 15 mL via INTRAOCULAR

## 2023-09-20 MED ORDER — TETRACAINE HCL 0.5 % OP SOLN
1.0000 [drp] | OPHTHALMIC | Status: AC | PRN
  Administered 2023-09-20 (×3): 1 [drp] via OPHTHALMIC

## 2023-09-20 MED ORDER — TROPICAMIDE 1 % OP SOLN
1.0000 [drp] | OPHTHALMIC | Status: AC | PRN
  Administered 2023-09-20 (×3): 1 [drp] via OPHTHALMIC

## 2023-09-20 MED ORDER — SODIUM HYALURONATE 23MG/ML IO SOSY
PREFILLED_SYRINGE | INTRAOCULAR | Status: DC | PRN
Start: 2023-09-20 — End: 2023-09-20
  Administered 2023-09-20: .6 mL via INTRAOCULAR

## 2023-09-20 MED ORDER — POVIDONE-IODINE 5 % OP SOLN
OPHTHALMIC | Status: DC | PRN
  Administered 2023-09-20: 1 via OPHTHALMIC

## 2023-09-20 MED ORDER — MIDAZOLAM HCL 5 MG/5ML IJ SOLN
INTRAMUSCULAR | Status: DC | PRN
  Administered 2023-09-20: 1 mg via INTRAVENOUS

## 2023-09-20 MED ORDER — MOXIFLOXACIN HCL 5 MG/ML IO SOLN
INTRAOCULAR | Status: DC | PRN
Start: 2023-09-20 — End: 2023-09-20
  Administered 2023-09-20: .2 mL via INTRACAMERAL

## 2023-09-20 MED ORDER — DEXAMETHASONE 0.4 MG OP INST
VAGINAL_INSERT | OPHTHALMIC | Status: AC
  Filled 2023-09-20: qty 1

## 2023-09-20 SURGICAL SUPPLY — 13 items
CATARACT SUITE SIGHTPATH (MISCELLANEOUS) ×1 IMPLANT
CLOTH BEACON ORANGE TIMEOUT ST (SAFETY) ×2 IMPLANT
EYE SHIELD UNIVERSAL CLEAR (GAUZE/BANDAGES/DRESSINGS) IMPLANT
FEE CATARACT SUITE SIGHTPATH (MISCELLANEOUS) ×2 IMPLANT
GLOVE BIOGEL PI IND STRL 7.0 (GLOVE) ×4 IMPLANT
LENS IOL EYHANCE TRC 150 17.0 IMPLANT
NDL HYPO 18GX1.5 BLUNT FILL (NEEDLE) ×2 IMPLANT
NEEDLE HYPO 18GX1.5 BLUNT FILL (NEEDLE) ×1 IMPLANT
PAD ARMBOARD POSITIONER FOAM (MISCELLANEOUS) ×2 IMPLANT
SYR TB 1ML LL NO SAFETY (SYRINGE) ×2 IMPLANT
TAPE PAPER 2X10 WHT MICROPORE (GAUZE/BANDAGES/DRESSINGS) IMPLANT
TAPE SURG TRANSPORE 1 IN (GAUZE/BANDAGES/DRESSINGS) IMPLANT
WATER STERILE IRR 250ML POUR (IV SOLUTION) ×2 IMPLANT

## 2023-09-20 NOTE — Op Note (Signed)
 aDate of procedure: 09/20/23  Pre-operative diagnosis: Visually significant age-related combined cataract, Right Eye; Visually Significant Astigmatism, Right Eye (H25.811)  Post-operative diagnosis: Visually significant age-related cataract, Right Eye; Visually Significant Astigmatism, Right Eye Pain and inflammation after cataract surgery, right eye  Procedure:  Removal of cataract via phacoemulsification and insertion of intra-ocular lens Kaitlyn Porter and Kaitlyn Porter +17.0D into the capsular bag of the Right Eye Placement of Dextenza  intracanalicular insert, right lower eyelid   Attending surgeon: Pleas Brill. Angele Wiemann, MD, MA  Anesthesia: MAC, Topical Akten  Complications: None  Estimated Blood Loss: <37mL (minimal)  Specimens: None  Implants: As above  Indications:  Visually significant age-related cataract, Right Eye; Visually Significant Astigmatism, Right Eye  Procedure:  The patient was seen and identified in the pre-operative area. The operative eye was identified and dilated.  The operative eye was marked.  Pre-operative toric markers were used to mark the eye at 0 and 180 degrees. Topical anesthesia was administered to the operative eye.     The patient was then to the operative suite and placed in the supine position.  A timeout was performed confirming the patient, procedure to be performed, and all other relevant information.   The patient's face was prepped and draped in the usual fashion for intra-ocular surgery.  A lid speculum was placed into the operative eye and the surgical microscope moved into place and focused.  A superotemporal paracentesis was created using a 20 gauge paracentesis blade.  Shugarcaine was injected into the anterior chamber.  Viscoelastic was injected into the anterior chamber.  A temporal clear-corneal main wound incision was created using a 2.62mm microkeratome.  A continuous curvilinear capsulorrhexis was initiated using an irrigating cystitome and  completed using capsulorrhexis forceps.  Hydrodissection and hydrodeliniation were performed.  Viscoelastic was injected into the anterior chamber.  A phacoemulsification handpiece and a chopper as a second instrument were used to remove the nucleus and epinucleus. The irrigation/aspiration handpiece was used to remove any remaining cortical material.   The capsular bag was reinflated with viscoelastic, checked, and found to be intact.  The intraocular lens was inserted into the capsular bag and dialed into place using a Kuglen hook to 106 degrees.  The irrigation/aspiration handpiece was used to remove any remaining viscoelastic.  The clear corneal wound and paracentesis wounds were then hydrated and checked with Weck-Cels to be watertight. 0.1mL of moxifloxacin was injected into the anterior chamber. The lid-speculum was removed.    The lower punctum was dilated and filled with Provisc. A Dextenza  implant was placed in the lower canaliculus without complication.  The drape was removed. The patient's face was cleaned with a wet and dry 4x4. A clear shield was taped over the eye. The patient was taken to the post-operative care unit in good condition, having tolerated the procedure well.  Post-Op Instructions: The patient will follow up at [facility] for a same day post-operative evaluation and will receive all other orders and instructions.

## 2023-09-20 NOTE — Transfer of Care (Signed)
 Immediate Anesthesia Transfer of Care Note  Patient: Xianna Siverling Hawks  Procedure(s) Performed: PHACOEMULSIFICATION, CATARACT, WITH IOL INSERTION (Right: Eye) INSERTION, STENT, DRUG-ELUTING, LACRIMAL CANALICULUS (Right: Eye)  Patient Location: PACU  Anesthesia Type:MAC  Level of Consciousness: awake and alert   Airway & Oxygen Therapy: Patient Spontanous Breathing and Patient connected to nasal cannula oxygen  Post-op Assessment: Report given to RN and Post -op Vital signs reviewed and stable  Post vital signs: Reviewed and stable  Last Vitals:  Vitals Value Taken Time  BP 146/79 09/20/23 0855  Temp 36.6 C 09/20/23 0855  Pulse 71 09/20/23 0855  Resp 18 09/20/23 0855  SpO2 100 % 09/20/23 0855    Last Pain:  Vitals:   09/20/23 0855  TempSrc: Oral  PainSc: 0-No pain         Complications: No notable events documented.

## 2023-09-20 NOTE — Anesthesia Preprocedure Evaluation (Signed)
 Anesthesia Evaluation  Patient identified by MRN, date of birth, ID band Patient awake    Reviewed: Allergy & Precautions, H&P , NPO status , Patient's Chart, lab work & pertinent test results  History of Anesthesia Complications Negative for: history of anesthetic complications  Airway Mallampati: I  TM Distance: >3 FB Neck ROM: Full    Dental  (+) Teeth Intact, Dental Advisory Given   Pulmonary former smoker   Pulmonary exam normal breath sounds clear to auscultation       Cardiovascular (-) angina negative cardio ROS Normal cardiovascular exam Rhythm:Regular Rate:Normal     Neuro/Psych negative neurological ROS     GI/Hepatic negative GI ROS,,,  Endo/Other  negative endocrine ROS    Renal/GU negative Renal ROS     Musculoskeletal   Abdominal   Peds  Hematology   Anesthesia Other Findings Breast cancer  Reproductive/Obstetrics                              Anesthesia Physical Anesthesia Plan  ASA: II  Anesthesia Plan: General   Post-op Pain Management:    Induction: Intravenous  PONV Risk Score and Plan:   Airway Management Planned: LMA  Additional Equipment:   Intra-op Plan:   Post-operative Plan:   Informed Consent: I have reviewed the patients History and Physical, chart, labs and discussed the procedure including the risks, benefits and alternatives for the proposed anesthesia with the patient or authorized representative who has indicated his/her understanding and acceptance.     Dental advisory given  Plan Discussed with: CRNA and Surgeon  Anesthesia Plan Comments: (Plan routine monitors, GA- LMA OK, pectoralis block for post op analgesia)         Anesthesia Quick Evaluation

## 2023-09-20 NOTE — Discharge Instructions (Signed)
 Please discharge patient when stable, will follow up today with Dr. June Leap at the [facility] office immediately following discharge.  Leave shield in place until visit.  All paperwork with discharge instructions will be given at the office.  [facility] Address:  [address]  [place], [place] [address]

## 2023-09-20 NOTE — Interval H&P Note (Signed)
 History and Physical Interval Note:  09/20/2023 8:28 AM  Kaitlyn Porter  has presented today for surgery, with the diagnosis of combined forms age related cataract, right eye.  The various methods of treatment have been discussed with the patient and family. After consideration of risks, benefits and other options for treatment, the patient has consented to  Procedure(s): PHACOEMULSIFICATION, CATARACT, WITH IOL INSERTION (Right) INSERTION, STENT, DRUG-ELUTING, LACRIMAL CANALICULUS (Right) as a surgical intervention.  The patient's history has been reviewed, patient examined, no change in status, stable for surgery.  I have reviewed the patient's chart and labs.  Questions were answered to the patient's satisfaction.     Tarri Farm

## 2023-09-22 NOTE — Anesthesia Postprocedure Evaluation (Signed)
 Anesthesia Post Note  Patient: Kaitlyn Porter  Procedure(s) Performed: PHACOEMULSIFICATION, CATARACT, WITH IOL INSERTION (Right: Eye) INSERTION, STENT, DRUG-ELUTING, LACRIMAL CANALICULUS (Right: Eye)  Patient location during evaluation: Phase II Anesthesia Type: General Level of consciousness: awake Pain management: pain level controlled Vital Signs Assessment: post-procedure vital signs reviewed and stable Respiratory status: spontaneous breathing and respiratory function stable Cardiovascular status: blood pressure returned to baseline and stable Postop Assessment: no headache and no apparent nausea or vomiting Anesthetic complications: no Comments: Late entry   No notable events documented.   Last Vitals:  Vitals:   09/20/23 0746 09/20/23 0855  BP: (!) 154/81 (!) 146/79  Pulse: 74 71  Resp: 16 18  Temp: 36.6 C 36.6 C  SpO2: 96% 100%    Last Pain:  Vitals:   09/20/23 0855  TempSrc: Oral  PainSc: 0-No pain                 Coretha Dew

## 2023-09-23 ENCOUNTER — Encounter (HOSPITAL_COMMUNITY): Payer: Self-pay | Admitting: Ophthalmology

## 2023-10-07 DIAGNOSIS — H25812 Combined forms of age-related cataract, left eye: Secondary | ICD-10-CM | POA: Diagnosis not present

## 2023-10-08 ENCOUNTER — Encounter (HOSPITAL_COMMUNITY)
Admission: RE | Admit: 2023-10-08 | Discharge: 2023-10-08 | Disposition: A | Discharge status: Home / Self Care | Source: Ambulatory Visit | Attending: Ophthalmology | Admitting: Ophthalmology

## 2023-10-08 ENCOUNTER — Encounter (HOSPITAL_COMMUNITY): Payer: Self-pay

## 2023-10-08 ENCOUNTER — Other Ambulatory Visit: Payer: Self-pay

## 2023-10-10 NOTE — H&P (Signed)
 Surgical History & Physical  Patient Name: Kaitlyn Porter  DOB: Oct 09, 1945  Surgery: Cataract extraction with intraocular lens implant phacoemulsification; Left Eye Surgeon: Tarri Farm MD Surgery Date: 10/11/2023 Pre-Op Date: 09/23/2023  HPI: A 47 Yr. old female patient 1. The patient is returning after cataract surgery. The right eye is affected. Status post cataract surgery, which began 3 days ago: Since the last visit, the affected area is doing well. The patient's vision is improved. The condition's severity is constant. Patient is following medication instructions. 2. The patient is returning for a cataract follow-up. Since the last visit, the affected area is tolerating. The patient's vision is blurry. The condition's severity is constant. Patient is not taking medications.  Medical History: Dry Eyes Cataracts  Cholesterol  Review of Systems Negative Allergic/Immunologic Negative Cardiovascular Negative Constitutional Negative Ear, Nose, Mouth & Throat Negative Endocrine Negative Eyes Negative Gastrointestinal Negative Genitourinary Negative Hemotologic/Lymphatic Negative Integumentary Negative Musculoskeletal Negative Neurological Negative Psychiatry Negative Respiratory  Social Never smoked  Medication Prednisolone-moxiflox-bromfen,  Rosuvastatin, Cyclosporine, Venlafaxine   Sx/Procedures Structure of eye, Lasik, Phaco c IOL OD - toric - dextenza   Drug Allergies  Latex   History & Physical: Heent: cataract NECK: supple without bruits LUNGS: lungs clear to auscultation CV: regular rate and rhythm Abdomen: soft and non-tender  Impression & Plan: Assessment: 1.  CATARACT EXTRACTION STATUS; Right Eye (Z98.41) 2.  COMBINED FORMS AGE RELATED CATARACT; Left Eye (H25.812) 3.  ASTIGMATISM, REGULAR; Both Eyes (H52.223)  Plan: 1.  3 days S/P phaco PCIOL OD. Doing well since surgery. Continue Combo drop for 4 more days then stop. Dextenza  recommended for post  operative pain and inflammation.  2.  Cataract accounts for the patient's decreased vision. This visual impairment is not correctable with a tolerable change in glasses or contact lenses. Cataract surgery with an implantation of a new lens should significantly improve the visual and functional status of the patient. Discussed all risks, benefits, alternatives, and potential complications. Discussed the procedures and recovery. Patient desires to have surgery. A-scan ordered and performed today for intra-ocular lens calculations. The surgery will be performed in order to improve vision for driving, reading, and for eye examinations. Recommend phacoemulsification with intra-ocular lens. Recommend Dextenza  for post-operative pain and inflammation. Left Eye. Surgery required to correct imbalance of vision. Dilates well - Omidria by protocol. Toric Lens. S/P Laser Vision Correction - Myopic LASIK - barrett True K toric.  3.  Recommend toric IOL OU.

## 2023-10-11 ENCOUNTER — Ambulatory Visit (HOSPITAL_COMMUNITY): Payer: Self-pay | Admitting: Anesthesiology

## 2023-10-11 ENCOUNTER — Other Ambulatory Visit: Payer: Self-pay

## 2023-10-11 ENCOUNTER — Encounter (HOSPITAL_COMMUNITY)
Admission: RE | Disposition: A | Discharge status: Home / Self Care | Payer: Self-pay | Source: Home / Self Care | Attending: Ophthalmology

## 2023-10-11 ENCOUNTER — Encounter (HOSPITAL_COMMUNITY): Payer: Self-pay | Admitting: Ophthalmology

## 2023-10-11 ENCOUNTER — Ambulatory Visit (HOSPITAL_COMMUNITY)
Admission: RE | Admit: 2023-10-11 | Discharge: 2023-10-11 | Disposition: A | Discharge status: Home / Self Care | Attending: Ophthalmology | Admitting: Ophthalmology

## 2023-10-11 DIAGNOSIS — H25812 Combined forms of age-related cataract, left eye: Secondary | ICD-10-CM | POA: Insufficient documentation

## 2023-10-11 DIAGNOSIS — I10 Essential (primary) hypertension: Secondary | ICD-10-CM | POA: Diagnosis not present

## 2023-10-11 DIAGNOSIS — Z87891 Personal history of nicotine dependence: Secondary | ICD-10-CM | POA: Diagnosis not present

## 2023-10-11 DIAGNOSIS — H52202 Unspecified astigmatism, left eye: Secondary | ICD-10-CM | POA: Insufficient documentation

## 2023-10-11 DIAGNOSIS — H2512 Age-related nuclear cataract, left eye: Secondary | ICD-10-CM

## 2023-10-11 DIAGNOSIS — H5712 Ocular pain, left eye: Secondary | ICD-10-CM | POA: Diagnosis not present

## 2023-10-11 HISTORY — PX: INSERTION, STENT, DRUG-ELUTING, LACRIMAL CANALICULUS: SHX7453

## 2023-10-11 HISTORY — PX: CATARACT EXTRACTION W/PHACO: SHX586

## 2023-10-11 SURGERY — PHACOEMULSIFICATION, CATARACT, WITH IOL INSERTION
Anesthesia: Monitor Anesthesia Care | Site: Eye | Laterality: Left

## 2023-10-11 MED ORDER — DEXAMETHASONE 0.4 MG OP INST
VAGINAL_INSERT | OPHTHALMIC | Status: AC
  Filled 2023-10-11: qty 1

## 2023-10-11 MED ORDER — MIDAZOLAM HCL 2 MG/2ML IJ SOLN
INTRAMUSCULAR | Status: DC | PRN
  Administered 2023-10-11: 1 mg via INTRAVENOUS

## 2023-10-11 MED ORDER — LACTATED RINGERS IV SOLN: INTRAVENOUS | Status: DC

## 2023-10-11 MED ORDER — POVIDONE-IODINE 5 % OP SOLN
OPHTHALMIC | Status: DC | PRN
Start: 2023-10-11 — End: 2023-10-11
  Administered 2023-10-11: 1 via OPHTHALMIC

## 2023-10-11 MED ORDER — STERILE WATER FOR IRRIGATION IR SOLN
Status: DC | PRN
  Administered 2023-10-11: 250 mL

## 2023-10-11 MED ORDER — MIDAZOLAM HCL 2 MG/2ML IJ SOLN
INTRAMUSCULAR | Status: AC
  Filled 2023-10-11: qty 2

## 2023-10-11 MED ORDER — SODIUM HYALURONATE 23MG/ML IO SOSY
PREFILLED_SYRINGE | INTRAOCULAR | Status: DC | PRN
Start: 2023-10-11 — End: 2023-10-11
  Administered 2023-10-11: .6 mL via INTRAOCULAR

## 2023-10-11 MED ORDER — SODIUM CHLORIDE 0.9% FLUSH
INTRAVENOUS | Status: DC | PRN
  Administered 2023-10-11: 7 mL via INTRAVENOUS

## 2023-10-11 MED ORDER — MOXIFLOXACIN HCL 5 MG/ML IO SOLN
INTRAOCULAR | Status: DC | PRN
  Administered 2023-10-11: .2 mL via OPHTHALMIC

## 2023-10-11 MED ORDER — DEXAMETHASONE 0.4 MG OP INST
VAGINAL_INSERT | OPHTHALMIC | Status: DC | PRN
Start: 2023-10-11 — End: 2023-10-11
  Administered 2023-10-11: .4 mg via OPHTHALMIC

## 2023-10-11 MED ORDER — TETRACAINE HCL 0.5 % OP SOLN
1.0000 [drp] | OPHTHALMIC | Status: AC | PRN
  Administered 2023-10-11 (×3): 1 [drp] via OPHTHALMIC

## 2023-10-11 MED ORDER — SODIUM HYALURONATE 10 MG/ML IO SOLUTION
PREFILLED_SYRINGE | INTRAOCULAR | Status: DC | PRN
  Administered 2023-10-11: .85 mL via INTRAOCULAR

## 2023-10-11 MED ORDER — LIDOCAINE HCL 3.5 % OP GEL
1.0000 | Freq: Once | OPHTHALMIC | Status: AC
  Administered 2023-10-11: 1 via OPHTHALMIC

## 2023-10-11 MED ORDER — TROPICAMIDE 1 % OP SOLN
1.0000 [drp] | OPHTHALMIC | Status: AC | PRN
  Administered 2023-10-11 (×3): 1 [drp] via OPHTHALMIC

## 2023-10-11 MED ORDER — PHENYLEPHRINE HCL 2.5 % OP SOLN
1.0000 [drp] | OPHTHALMIC | Status: AC | PRN
  Administered 2023-10-11 (×3): 1 [drp] via OPHTHALMIC

## 2023-10-11 MED ORDER — PHENYLEPHRINE-KETOROLAC 1-0.3 % IO SOLN
INTRAOCULAR | Status: DC | PRN
  Administered 2023-10-11: 500 mL via OPHTHALMIC

## 2023-10-11 MED ORDER — BSS IO SOLN
INTRAOCULAR | Status: DC | PRN
  Administered 2023-10-11: 15 mL via INTRAOCULAR

## 2023-10-11 SURGICAL SUPPLY — 12 items
CATARACT SUITE SIGHTPATH (MISCELLANEOUS) ×2 IMPLANT
CLOTH BEACON ORANGE TIMEOUT ST (SAFETY) ×3 IMPLANT
EYE SHIELD UNIVERSAL CLEAR (GAUZE/BANDAGES/DRESSINGS) IMPLANT
FEE CATARACT SUITE SIGHTPATH (MISCELLANEOUS) ×3 IMPLANT
GLOVE BIOGEL PI IND STRL 7.0 (GLOVE) ×6 IMPLANT
LENS IOL EYHANCE TRC 300 17.5 IMPLANT
NDL HYPO 18GX1.5 BLUNT FILL (NEEDLE) ×3 IMPLANT
NEEDLE HYPO 18GX1.5 BLUNT FILL (NEEDLE) ×2 IMPLANT
PAD ARMBOARD POSITIONER FOAM (MISCELLANEOUS) ×3 IMPLANT
SYR TB 1ML LL NO SAFETY (SYRINGE) ×3 IMPLANT
TAPE SURG TRANSPORE 1 IN (GAUZE/BANDAGES/DRESSINGS) IMPLANT
WATER STERILE IRR 250ML POUR (IV SOLUTION) ×3 IMPLANT

## 2023-10-11 NOTE — Anesthesia Procedure Notes (Signed)
 Procedure Name: MAC Date/Time: 10/11/2023 11:13 AM  Performed by: Sherwin Donate, CRNAPre-anesthesia Checklist: Patient identified, Emergency Drugs available, Suction available and Patient being monitored Patient Re-evaluated:Patient Re-evaluated prior to induction Oxygen Delivery Method: Nasal cannula Placement Confirmation: positive ETCO2

## 2023-10-11 NOTE — Interval H&P Note (Signed)
 History and Physical Interval Note:  10/11/2023 11:10 AM  Kaitlyn Porter  has presented today for surgery, with the diagnosis of combinded forms age related cataract, left eye.  The various methods of treatment have been discussed with the patient and family. After consideration of risks, benefits and other options for treatment, the patient has consented to  Procedure(s): PHACOEMULSIFICATION, CATARACT, WITH IOL INSERTION (Left) INSERTION, STENT, DRUG-ELUTING, LACRIMAL CANALICULUS (Left) as a surgical intervention.  The patient's history has been reviewed, patient examined, no change in status, stable for surgery.  I have reviewed the patient's chart and labs.  Questions were answered to the patient's satisfaction.     Tarri Farm

## 2023-10-11 NOTE — Op Note (Signed)
 Date of procedure: 10/11/23  Pre-operative diagnosis: Visually significant age-related combined form cataract, Left Eye; Visually Significant Astigmatism, Left Eye (H25.812)  Post-operative diagnosis: Visually significant age-related cataract, Left Eye; Visually Significant Astigmatism, Left Eye Pain and inflammation after cataract surgery, left eye  Procedure:  Removal of cataract via phacoemulsification and insertion of intra-ocular lens Lincoln Renshaw and Johnson [id number] +17.5D into the capsular bag of the Left Eye Placement of Dextenza  insert, left lower eyelid  Attending surgeon: Pleas Brill. Meleah Demeyer, MD, MA  Anesthesia: MAC, Topical Akten  Complications: None  Estimated Blood Loss: <74mL (minimal)  Specimens: None  Implants: As above  Indications:  Visually significant age-related cataract, Left Eye; Visually Significant Astigmatism, Left Eye  Procedure:  The patient was seen and identified in the pre-operative area. The operative eye was identified and dilated.  The operative eye was marked.  Pre-operative toric markers were used to mark the eye at 0 and 180 degrees. Topical anesthesia was administered to the operative eye.     The patient was then to the operative suite and placed in the supine position.  A timeout was performed confirming the patient, procedure to be performed, and all other relevant information.   The patient's face was prepped and draped in the usual fashion for intra-ocular surgery.  A lid speculum was placed into the operative eye and the surgical microscope moved into place and focused.  A superotemporal paracentesis was created using a 20 gauge paracentesis blade. Omidria was injected into the anterior chamber. Shugarcaine was injected into the anterior chamber.  Viscoelastic was injected into the anterior chamber.  A temporal clear-corneal main wound incision was created using a 2.50mm microkeratome.  A continuous curvilinear capsulorrhexis was initiated using an  irrigating cystitome and completed using capsulorrhexis forceps.  Hydrodissection and hydrodeliniation were performed.  Viscoelastic was injected into the anterior chamber.  A phacoemulsification handpiece and a chopper as a second instrument were used to remove the nucleus and epinucleus. The irrigation/aspiration handpiece was used to remove any remaining cortical material.   The capsular bag was reinflated with viscoelastic, checked, and found to be intact.  The eye was marked to the per-op meridian.  The intraocular lens was inserted into the capsular bag and dialed into place using a Kuglen hook to ?? degrees.  The irrigation/aspiration handpiece was used to remove any remaining viscoelastic.  The clear corneal wound and paracentesis wounds were then hydrated and checked with Weck-Cels to be watertight. 0.1mL of moxifloxacin  was injected into the anterior chamber. The lid-speculum was removed.    The lower punctum was dilated and filled with Provisc. A Dextenza  implant was placed in the lower canaliculus without complication.  The drape was removed. The patient's face was cleaned with a wet and dry 4x4.   A clear shield was taped over the eye. The patient was taken to the post-operative care unit in good condition, having tolerated the procedure well.  Post-Op Instructions: The patient will follow up at [facility] for a same day post-operative evaluation and will receive all other orders and instructions.

## 2023-10-11 NOTE — Discharge Instructions (Signed)
 Please discharge patient when stable, will follow up today with Dr. June Leap at the [facility] office immediately following discharge.  Leave shield in place until visit.  All paperwork with discharge instructions will be given at the office.  [facility] Address:  [address]  [place], [place] [address]

## 2023-10-11 NOTE — Anesthesia Preprocedure Evaluation (Signed)
Anesthesia Evaluation  Patient identified by MRN, date of birth, ID band Patient awake    Reviewed: Allergy & Precautions, H&P , NPO status , Patient's Chart, lab work & pertinent test results, reviewed documented beta blocker date and time   Airway Mallampati: II  TM Distance: >3 FB Neck ROM: full    Dental no notable dental hx.    Pulmonary neg pulmonary ROS, former smoker   Pulmonary exam normal breath sounds clear to auscultation       Cardiovascular Exercise Tolerance: Good hypertension, negative cardio ROS  Rhythm:regular Rate:Normal     Neuro/Psych negative neurological ROS  negative psych ROS   GI/Hepatic negative GI ROS, Neg liver ROS,,,  Endo/Other  negative endocrine ROS    Renal/GU negative Renal ROS  negative genitourinary   Musculoskeletal   Abdominal   Peds  Hematology negative hematology ROS (+)   Anesthesia Other Findings   Reproductive/Obstetrics negative OB ROS                             Anesthesia Physical Anesthesia Plan  ASA: 2  Anesthesia Plan: MAC   Post-op Pain Management:    Induction:   PONV Risk Score and Plan:   Airway Management Planned:   Additional Equipment:   Intra-op Plan:   Post-operative Plan:   Informed Consent: I have reviewed the patients History and Physical, chart, labs and discussed the procedure including the risks, benefits and alternatives for the proposed anesthesia with the patient or authorized representative who has indicated his/her understanding and acceptance.     Dental Advisory Given  Plan Discussed with: CRNA  Anesthesia Plan Comments:         Anesthesia Quick Evaluation  

## 2023-10-11 NOTE — Transfer of Care (Signed)
 Immediate Anesthesia Transfer of Care Note  Patient: Kaitlyn Porter  Procedure(s) Performed: PHACOEMULSIFICATION, CATARACT, WITH IOL INSERTION (Left: Eye) INSERTION, STENT, DRUG-ELUTING, LACRIMAL CANALICULUS (Left)  Patient Location: Short Stay  Anesthesia Type:MAC  Level of Consciousness: awake, alert , and oriented  Airway & Oxygen Therapy: Patient Spontanous Breathing  Post-op Assessment: Report given to RN and Post -op Vital signs reviewed and stable  Post vital signs: Reviewed and stable  Last Vitals:  Vitals Value Taken Time  BP    Temp    Pulse    Resp    SpO2      Last Pain:  Vitals:   10/11/23 1021  PainSc: 0-No pain         Complications: No notable events documented.

## 2023-10-12 NOTE — Anesthesia Postprocedure Evaluation (Signed)
 Anesthesia Post Note  Patient: Kaitlyn Porter  Procedure(s) Performed: PHACOEMULSIFICATION, CATARACT, WITH IOL INSERTION (Left: Eye) INSERTION, STENT, DRUG-ELUTING, LACRIMAL CANALICULUS (Left)  Patient location during evaluation: Phase II Anesthesia Type: MAC Level of consciousness: awake Pain management: pain level controlled Vital Signs Assessment: post-procedure vital signs reviewed and stable Respiratory status: spontaneous breathing and respiratory function stable Cardiovascular status: blood pressure returned to baseline and stable Postop Assessment: no headache and no apparent nausea or vomiting Anesthetic complications: no Comments: Late entry   No notable events documented.   Last Vitals:  Vitals:   10/11/23 1000 10/11/23 1136  BP: (!) 147/79 (!) 155/85  Pulse:  67  Resp: 19 11  Temp: 36.8 C 36.6 C  SpO2: 97% 100%    Last Pain:  Vitals:   10/11/23 1136  TempSrc: Oral  PainSc: 0-No pain                 Yvonna JINNY Bosworth

## 2023-10-14 ENCOUNTER — Encounter (HOSPITAL_COMMUNITY): Payer: Self-pay | Admitting: Ophthalmology

## 2023-10-30 ENCOUNTER — Encounter: Payer: Self-pay | Admitting: Hematology and Oncology

## 2023-11-06 ENCOUNTER — Ambulatory Visit: Admitting: Internal Medicine

## 2023-11-07 ENCOUNTER — Telehealth: Payer: Self-pay | Admitting: Hematology and Oncology

## 2023-11-07 NOTE — Telephone Encounter (Signed)
 Called to reschedule patient appointment to due provider pal  request. I talked  to patient and they are aware of the changes that was made to the upcoming appointment

## 2023-11-29 ENCOUNTER — Encounter: Payer: Self-pay | Admitting: Hematology and Oncology

## 2023-12-02 DIAGNOSIS — M7541 Impingement syndrome of right shoulder: Secondary | ICD-10-CM | POA: Diagnosis not present

## 2023-12-10 DIAGNOSIS — G4700 Insomnia, unspecified: Secondary | ICD-10-CM | POA: Diagnosis not present

## 2023-12-10 DIAGNOSIS — C50919 Malignant neoplasm of unspecified site of unspecified female breast: Secondary | ICD-10-CM | POA: Diagnosis not present

## 2023-12-10 DIAGNOSIS — E785 Hyperlipidemia, unspecified: Secondary | ICD-10-CM | POA: Diagnosis not present

## 2023-12-10 DIAGNOSIS — M810 Age-related osteoporosis without current pathological fracture: Secondary | ICD-10-CM | POA: Diagnosis not present

## 2023-12-12 ENCOUNTER — Inpatient Hospital Stay: Payer: Medicare PPO

## 2023-12-12 ENCOUNTER — Inpatient Hospital Stay: Payer: Medicare PPO | Admitting: Hematology and Oncology

## 2023-12-17 DIAGNOSIS — Z79899 Other long term (current) drug therapy: Secondary | ICD-10-CM | POA: Diagnosis not present

## 2023-12-17 DIAGNOSIS — Z853 Personal history of malignant neoplasm of breast: Secondary | ICD-10-CM | POA: Diagnosis not present

## 2023-12-17 DIAGNOSIS — N951 Menopausal and female climacteric states: Secondary | ICD-10-CM | POA: Diagnosis not present

## 2023-12-17 DIAGNOSIS — M810 Age-related osteoporosis without current pathological fracture: Secondary | ICD-10-CM | POA: Diagnosis not present

## 2023-12-17 DIAGNOSIS — E785 Hyperlipidemia, unspecified: Secondary | ICD-10-CM | POA: Diagnosis not present

## 2023-12-17 DIAGNOSIS — I491 Atrial premature depolarization: Secondary | ICD-10-CM | POA: Diagnosis not present

## 2023-12-17 DIAGNOSIS — Z0001 Encounter for general adult medical examination with abnormal findings: Secondary | ICD-10-CM | POA: Diagnosis not present

## 2023-12-18 ENCOUNTER — Ambulatory Visit: Payer: Self-pay

## 2023-12-18 ENCOUNTER — Inpatient Hospital Stay: Payer: Self-pay

## 2023-12-18 ENCOUNTER — Ambulatory Visit: Payer: Self-pay | Admitting: Hematology and Oncology

## 2023-12-24 ENCOUNTER — Other Ambulatory Visit: Payer: Self-pay | Admitting: *Deleted

## 2023-12-24 DIAGNOSIS — Z170 Estrogen receptor positive status [ER+]: Secondary | ICD-10-CM

## 2023-12-25 ENCOUNTER — Inpatient Hospital Stay: Payer: Self-pay

## 2023-12-25 ENCOUNTER — Inpatient Hospital Stay (HOSPITAL_BASED_OUTPATIENT_CLINIC_OR_DEPARTMENT_OTHER): Payer: Self-pay | Admitting: Hematology and Oncology

## 2023-12-25 ENCOUNTER — Inpatient Hospital Stay: Payer: Self-pay | Attending: Hematology and Oncology

## 2023-12-25 VITALS — BP 148/80 | HR 87 | Temp 97.8°F | Resp 16 | Wt 135.2 lb

## 2023-12-25 DIAGNOSIS — Z79811 Long term (current) use of aromatase inhibitors: Secondary | ICD-10-CM | POA: Diagnosis not present

## 2023-12-25 DIAGNOSIS — Z170 Estrogen receptor positive status [ER+]: Secondary | ICD-10-CM | POA: Diagnosis not present

## 2023-12-25 DIAGNOSIS — C50211 Malignant neoplasm of upper-inner quadrant of right female breast: Secondary | ICD-10-CM | POA: Diagnosis not present

## 2023-12-25 DIAGNOSIS — M810 Age-related osteoporosis without current pathological fracture: Secondary | ICD-10-CM | POA: Insufficient documentation

## 2023-12-25 DIAGNOSIS — Z79899 Other long term (current) drug therapy: Secondary | ICD-10-CM | POA: Diagnosis not present

## 2023-12-25 DIAGNOSIS — Z923 Personal history of irradiation: Secondary | ICD-10-CM | POA: Insufficient documentation

## 2023-12-25 DIAGNOSIS — R232 Flushing: Secondary | ICD-10-CM | POA: Insufficient documentation

## 2023-12-25 DIAGNOSIS — Z1721 Progesterone receptor positive status: Secondary | ICD-10-CM | POA: Diagnosis not present

## 2023-12-25 LAB — CBC WITH DIFFERENTIAL (CANCER CENTER ONLY)
Abs Immature Granulocytes: 0.01 K/uL (ref 0.00–0.07)
Basophils Absolute: 0 K/uL (ref 0.0–0.1)
Basophils Relative: 1 %
Eosinophils Absolute: 0.1 K/uL (ref 0.0–0.5)
Eosinophils Relative: 2 %
HCT: 36.5 % (ref 36.0–46.0)
Hemoglobin: 12.6 g/dL (ref 12.0–15.0)
Immature Granulocytes: 0 %
Lymphocytes Relative: 28 %
Lymphs Abs: 1.7 K/uL (ref 0.7–4.0)
MCH: 35.1 pg — ABNORMAL HIGH (ref 26.0–34.0)
MCHC: 34.5 g/dL (ref 30.0–36.0)
MCV: 101.7 fL — ABNORMAL HIGH (ref 80.0–100.0)
Monocytes Absolute: 0.4 K/uL (ref 0.1–1.0)
Monocytes Relative: 7 %
Neutro Abs: 3.8 K/uL (ref 1.7–7.7)
Neutrophils Relative %: 62 %
Platelet Count: 233 K/uL (ref 150–400)
RBC: 3.59 MIL/uL — ABNORMAL LOW (ref 3.87–5.11)
RDW: 12 % (ref 11.5–15.5)
WBC Count: 6 K/uL (ref 4.0–10.5)
nRBC: 0 % (ref 0.0–0.2)

## 2023-12-25 LAB — CMP (CANCER CENTER ONLY)
ALT: 15 U/L (ref 0–44)
AST: 15 U/L (ref 15–41)
Albumin: 3.9 g/dL (ref 3.5–5.0)
Alkaline Phosphatase: 46 U/L (ref 38–126)
Anion gap: 3 — ABNORMAL LOW (ref 5–15)
BUN: 21 mg/dL (ref 8–23)
CO2: 31 mmol/L (ref 22–32)
Calcium: 9.5 mg/dL (ref 8.9–10.3)
Chloride: 106 mmol/L (ref 98–111)
Creatinine: 0.7 mg/dL (ref 0.44–1.00)
GFR, Estimated: >60 mL/min (ref >60)
Glucose, Bld: 103 mg/dL — ABNORMAL HIGH (ref 70–99)
Potassium: 5.2 mmol/L — ABNORMAL HIGH (ref 3.5–5.1)
Sodium: 140 mmol/L (ref 135–145)
Total Bilirubin: 0.5 mg/dL (ref 0.0–1.2)
Total Protein: 6.8 g/dL (ref 6.5–8.1)

## 2023-12-25 MED ORDER — DENOSUMAB 60 MG/ML ~~LOC~~ SOSY
60.0000 mg | PREFILLED_SYRINGE | Freq: Once | SUBCUTANEOUS | Status: AC
  Administered 2023-12-25: 60 mg via SUBCUTANEOUS
  Filled 2023-12-25: qty 1

## 2023-12-25 NOTE — Assessment & Plan Note (Signed)
 100% HER-2 negative Ki-67 14% grade 1, Oncotype DX recurrence score 13 (ROR 10%)   Treatment summary: Tamoxifen  20 mg daily started 01/22/2014 changed to anastrozole  October 2016 completed 2022    Occasional hot flashes well controlled with Effexor    Osteoporosis:  Bone density 01/23/2018: T score -2.28 January 2020. T score -2.9 06/01/2022: T-score -2.8 Currently on Prolia  with calcium and vitamin D    Surveillance: 1.  Breast exam 12/25/2023: No palpable lumps or nodules of concern 2. mammogram: 03/14/2023: Benign breast density category C   Return to clinic in every 6 months for Prolia  and once a year for follow-up with us 

## 2023-12-25 NOTE — Progress Notes (Signed)
 Patient Care Team: Sheryle Carwin, MD as PCP - General (Internal Medicine) Mallipeddi, Diannah SQUIBB, MD as PCP - Cardiology (Cardiology)  DIAGNOSIS:  Encounter Diagnosis  Name Primary?   Malignant neoplasm of upper-inner quadrant of right breast in female, estrogen receptor positive (HCC) Yes    SUMMARY OF ONCOLOGIC HISTORY: Oncology History  Breast cancer of upper-inner quadrant of right female breast (HCC)  12/11/2013 Breast MRI   Right breast upper inner quadrant 2.1 x 1.6 x 1.6 cm oval mass no abnormal lymph nodes   12/14/2013 Initial Diagnosis   Breast cancer of upper-inner quadrant of right female breast   12/23/2013 Surgery   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, 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, lymphovascular invasion is found, for sentinel lymph nodes negative ER 100% PR 100% HER-2 negative ratio 1.1, Ki-67 14%; Oncotype DX score of 15 (10% ROR)   02/15/2014 - 04/02/2014 Radiation Therapy   Adjuvant radiation therapy   04/06/2014 -  Anti-estrogen oral therapy   Tamoxifen  20 mg daily (developed neuropathy), switched to anastrozole  1 mg daily 01/24/2015     CHIEF COMPLIANT: Surveillance of breast cancer and for Prolia   HISTORY OF PRESENT ILLNESS:   History of Present Illness An 78 year old female with a history of breast cancer presents for routine follow-up in hematology and medical oncology.  She has a history of malignant neoplasm of the upper-inner quadrant of the right breast, estrogen receptor positive, with no current breast discomfort or concerns. Blood work shows normal counts with occasional slightly elevated potassium levels. She is receiving bone injections for osteoporosis without issues or discomfort. She is currently taking Venlafaxine  XR 37.5 mg daily. She has experienced weight loss, attributed to a deliberate effort to lose a few pounds.     ALLERGIES:  is allergic to latex.  MEDICATIONS:  Current Outpatient Medications  Medication Sig  Dispense Refill   CALCIUM PO Take 1 tablet by mouth in the morning and at bedtime.     L-Lysine 1000 MG TABS Take 1,000 mg by mouth daily.     Multiple Vitamin (MULTIVITAMIN) capsule Take 1 capsule by mouth daily.     nitroGLYCERIN  (NITROSTAT ) 0.4 MG SL tablet Place 1 tablet (0.4 mg total) under the tongue every 5 (five) minutes as needed for chest pain. If a single episode of chest pain is not relieved by one tablet, the patient will try another within 5 minutes; and if this doesn't relieve the pain, the patient is instructed to call 911 for transportation to an emergency department. 25 tablet 3   Omega-3 Fatty Acids (FISH OIL ULTRA) 1400 MG CAPS Take 1,400 mg by mouth daily.     RESTASIS 0.05 % ophthalmic emulsion      rosuvastatin (CRESTOR) 10 MG tablet Take 10 mg by mouth at bedtime.     venlafaxine  XR (EFFEXOR -XR) 37.5 MG 24 hr capsule TAKE 1 CAPSULE EVERY DAY 90 capsule 3   No current facility-administered medications for this visit.    PHYSICAL EXAMINATION: ECOG PERFORMANCE STATUS: 1 - Symptomatic but completely ambulatory  Vitals:   12/25/23 1040  BP: (!) 148/80  Pulse: 87  Resp: 16  Temp: 97.8 F (36.6 C)  SpO2: 98%   Filed Weights   12/25/23 1040  Weight: 135 lb 3.2 oz (61.3 kg)      LABORATORY DATA:  I have reviewed the data as listed    Latest Ref Rng & Units 12/25/2023   10:09 AM 06/14/2023   10:47 AM 12/12/2022  10:56 AM  CMP  Glucose 70 - 99 mg/dL 896  67  888   BUN 8 - 23 mg/dL 21  19  20    Creatinine 0.44 - 1.00 mg/dL 9.29  9.19  9.21   Sodium 135 - 145 mmol/L 140  140  142   Potassium 3.5 - 5.1 mmol/L 5.2  4.8  5.5   Chloride 98 - 111 mmol/L 106  106  107   CO2 22 - 32 mmol/L 31  31  32   Calcium 8.9 - 10.3 mg/dL 9.5  9.5  9.9   Total Protein 6.5 - 8.1 g/dL 6.8  6.6  7.1   Total Bilirubin 0.0 - 1.2 mg/dL 0.5  0.4  0.4   Alkaline Phos 38 - 126 U/L 46  43  43   AST 15 - 41 U/L 15  14  16    ALT 0 - 44 U/L 15  18  17      Lab Results  Component Value  Date   WBC 6.0 12/25/2023   HGB 12.6 12/25/2023   HCT 36.5 12/25/2023   MCV 101.7 (H) 12/25/2023   PLT 233 12/25/2023   NEUTROABS 3.8 12/25/2023    ASSESSMENT & PLAN:  Breast cancer of upper-inner quadrant of right female breast 100% HER-2 negative Ki-67 14% grade 1, Oncotype DX recurrence score 13 (ROR 10%)   Treatment summary: Tamoxifen  20 mg daily started 01/22/2014 changed to anastrozole  October 2016 completed 2022    Occasional hot flashes well controlled with Effexor    Osteoporosis:  Bone density 01/23/2018: T score -2.28 January 2020. T score -2.9 06/01/2022: T-score -2.8 Currently on Prolia  with calcium and vitamin D    Surveillance: 1.  Breast exam 12/25/2023: No palpable lumps or nodules of concern 2. mammogram: 03/14/2023: Benign breast density category C   Return to clinic in every 6 months for Prolia  and once a year for follow-up with us  ------------------------------------- Assessment and Plan Assessment & Plan Estrogen receptor positive malignant neoplasm of upper-inner quadrant of right breast No current discomfort or concerns. Blood work normal with minor variations. Slightly elevated potassium at 5.2, likely due to tourniquet, not concerning. - Proceed with current management. - Schedule follow-up lab work and injection in six months. - Schedule follow-up visit and injection in one year.  Osteoporosis - Continue current osteoporosis management with bone injections.      No orders of the defined types were placed in this encounter.  The patient has a good understanding of the overall plan. she agrees with it. she will call with any problems that may develop before the next visit here. Total time spent: 30 mins including face to face time and time spent for planning, charting and co-ordination of care   Viinay K Joangel Vanosdol, MD 12/25/23

## 2024-01-22 DIAGNOSIS — Z23 Encounter for immunization: Secondary | ICD-10-CM | POA: Diagnosis not present

## 2024-02-24 DIAGNOSIS — L821 Other seborrheic keratosis: Secondary | ICD-10-CM | POA: Diagnosis not present

## 2024-02-24 DIAGNOSIS — L570 Actinic keratosis: Secondary | ICD-10-CM | POA: Diagnosis not present

## 2024-02-24 DIAGNOSIS — D485 Neoplasm of uncertain behavior of skin: Secondary | ICD-10-CM | POA: Diagnosis not present

## 2024-02-24 DIAGNOSIS — C44519 Basal cell carcinoma of skin of other part of trunk: Secondary | ICD-10-CM | POA: Diagnosis not present

## 2024-02-24 DIAGNOSIS — L723 Sebaceous cyst: Secondary | ICD-10-CM | POA: Diagnosis not present

## 2024-02-24 DIAGNOSIS — Z85828 Personal history of other malignant neoplasm of skin: Secondary | ICD-10-CM | POA: Diagnosis not present

## 2024-04-20 ENCOUNTER — Other Ambulatory Visit: Payer: Self-pay | Admitting: Hematology and Oncology

## 2024-04-20 DIAGNOSIS — Z1231 Encounter for screening mammogram for malignant neoplasm of breast: Secondary | ICD-10-CM

## 2024-05-06 ENCOUNTER — Ambulatory Visit
Admission: RE | Admit: 2024-05-06 | Discharge: 2024-05-06 | Disposition: A | Discharge status: Home / Self Care | Source: Ambulatory Visit | Attending: Hematology and Oncology | Admitting: Hematology and Oncology

## 2024-05-06 DIAGNOSIS — Z1231 Encounter for screening mammogram for malignant neoplasm of breast: Secondary | ICD-10-CM

## 2024-05-08 ENCOUNTER — Ambulatory Visit: Payer: Self-pay | Admitting: Obstetrics and Gynecology

## 2024-06-25 ENCOUNTER — Other Ambulatory Visit

## 2024-06-25 ENCOUNTER — Ambulatory Visit

## 2024-12-24 ENCOUNTER — Ambulatory Visit: Admitting: Hematology and Oncology

## 2024-12-24 ENCOUNTER — Ambulatory Visit

## 2024-12-24 ENCOUNTER — Other Ambulatory Visit
